# Patient Record
Sex: Female | Born: 1995 | Race: White | Hispanic: No | Smoking: Former smoker
Health system: Southern US, Community
[De-identification: ages and names within clinical notes are randomized; demographics above are authoritative.]

## PROBLEM LIST (undated history)

## (undated) ENCOUNTER — Inpatient Hospital Stay: Payer: Self-pay

## (undated) DIAGNOSIS — K449 Diaphragmatic hernia without obstruction or gangrene: Secondary | ICD-10-CM

## (undated) DIAGNOSIS — B009 Herpesviral infection, unspecified: Secondary | ICD-10-CM

## (undated) DIAGNOSIS — K219 Gastro-esophageal reflux disease without esophagitis: Secondary | ICD-10-CM

## (undated) DIAGNOSIS — F32A Depression, unspecified: Secondary | ICD-10-CM

## (undated) DIAGNOSIS — F329 Major depressive disorder, single episode, unspecified: Secondary | ICD-10-CM

## (undated) DIAGNOSIS — F419 Anxiety disorder, unspecified: Secondary | ICD-10-CM

## (undated) HISTORY — DX: Diaphragmatic hernia without obstruction or gangrene: K44.9

## (undated) HISTORY — DX: Depression, unspecified: F32.A

## (undated) HISTORY — PX: INDUCED ABORTION: SHX677

## (undated) HISTORY — DX: Anxiety disorder, unspecified: F41.9

## (undated) HISTORY — DX: Gastro-esophageal reflux disease without esophagitis: K21.9

## (undated) HISTORY — DX: Major depressive disorder, single episode, unspecified: F32.9

## (undated) HISTORY — DX: Herpesviral infection, unspecified: B00.9

## (undated) HISTORY — PX: WISDOM TOOTH EXTRACTION: SHX21

---
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
## 2014-12-24 ENCOUNTER — Encounter: Payer: Self-pay | Admitting: Emergency Medicine

## 2014-12-24 ENCOUNTER — Emergency Department: Payer: Medicaid Other

## 2014-12-24 ENCOUNTER — Emergency Department
Admission: EM | Admit: 2014-12-24 | Discharge: 2014-12-24 | Disposition: A | Discharge status: Home / Self Care | Payer: Medicaid Other | Attending: Emergency Medicine | Admitting: Emergency Medicine

## 2014-12-24 DIAGNOSIS — R1030 Lower abdominal pain, unspecified: Secondary | ICD-10-CM | POA: Insufficient documentation

## 2014-12-24 DIAGNOSIS — O9989 Other specified diseases and conditions complicating pregnancy, childbirth and the puerperium: Secondary | ICD-10-CM | POA: Insufficient documentation

## 2014-12-24 DIAGNOSIS — H6693 Otitis media, unspecified, bilateral: Secondary | ICD-10-CM | POA: Diagnosis not present

## 2014-12-24 DIAGNOSIS — Z87891 Personal history of nicotine dependence: Secondary | ICD-10-CM | POA: Insufficient documentation

## 2014-12-24 DIAGNOSIS — Z3A01 Less than 8 weeks gestation of pregnancy: Secondary | ICD-10-CM | POA: Diagnosis not present

## 2014-12-24 DIAGNOSIS — O210 Mild hyperemesis gravidarum: Secondary | ICD-10-CM | POA: Insufficient documentation

## 2014-12-24 DIAGNOSIS — Z880 Allergy status to penicillin: Secondary | ICD-10-CM | POA: Diagnosis not present

## 2014-12-24 DIAGNOSIS — Z3491 Encounter for supervision of normal pregnancy, unspecified, first trimester: Secondary | ICD-10-CM

## 2014-12-24 DIAGNOSIS — O99351 Diseases of the nervous system complicating pregnancy, first trimester: Secondary | ICD-10-CM | POA: Insufficient documentation

## 2014-12-24 LAB — COMPREHENSIVE METABOLIC PANEL
ALBUMIN: 4.7 g/dL (ref 3.5–5.0)
ALT: 13 U/L — AB (ref 14–54)
AST: 18 U/L (ref 15–41)
Alkaline Phosphatase: 56 U/L (ref 38–126)
Anion gap: 7 (ref 5–15)
BUN: 7 mg/dL (ref 6–20)
CHLORIDE: 106 mmol/L (ref 101–111)
CO2: 24 mmol/L (ref 22–32)
Calcium: 9.5 mg/dL (ref 8.9–10.3)
Creatinine, Ser: 0.6 mg/dL (ref 0.44–1.00)
GFR calc Af Amer: >60 mL/min (ref >60)
GFR calc non Af Amer: >60 mL/min (ref >60)
GLUCOSE: 107 mg/dL — AB (ref 65–99)
POTASSIUM: 3.3 mmol/L — AB (ref 3.5–5.1)
SODIUM: 137 mmol/L (ref 135–145)
Total Bilirubin: 0.9 mg/dL (ref 0.3–1.2)
Total Protein: 8.2 g/dL — ABNORMAL HIGH (ref 6.5–8.1)

## 2014-12-24 LAB — URINALYSIS COMPLETE WITH MICROSCOPIC (ARMC ONLY)
Bilirubin Urine: NEGATIVE
GLUCOSE, UA: NEGATIVE mg/dL
HGB URINE DIPSTICK: NEGATIVE
Ketones, ur: NEGATIVE mg/dL
LEUKOCYTES UA: NEGATIVE
Nitrite: NEGATIVE
Protein, ur: NEGATIVE mg/dL
Specific Gravity, Urine: 1.021 (ref 1.005–1.030)
pH: 6 (ref 5.0–8.0)

## 2014-12-24 LAB — CBC
HEMATOCRIT: 41.7 % (ref 35.0–47.0)
Hemoglobin: 14.3 g/dL (ref 12.0–16.0)
MCH: 32.2 pg (ref 26.0–34.0)
MCHC: 34.2 g/dL (ref 32.0–36.0)
MCV: 94.2 fL (ref 80.0–100.0)
Platelets: 343 10*3/uL (ref 150–440)
RBC: 4.42 MIL/uL (ref 3.80–5.20)
RDW: 12.5 % (ref 11.5–14.5)
WBC: 13.7 10*3/uL — AB (ref 3.6–11.0)

## 2014-12-24 LAB — LIPASE, BLOOD: LIPASE: 35 U/L (ref 11–51)

## 2014-12-24 LAB — HCG, QUANTITATIVE, PREGNANCY: HCG, BETA CHAIN, QUANT, S: 18523 m[IU]/mL — AB (ref <5)

## 2014-12-24 MED ORDER — PROMETHAZINE HCL 25 MG PO TABS: 25.0000 mg | ORAL_TABLET | Freq: Four times a day (QID) | ORAL | Status: DC | PRN

## 2014-12-24 MED ORDER — AMOXICILLIN 500 MG PO CAPS: 500.0000 mg | ORAL_CAPSULE | Freq: Two times a day (BID) | ORAL | Status: DC

## 2014-12-24 MED ORDER — PRENATAL VITAMIN PLUS LOW IRON 27-1 MG PO TABS: 1.0000 | ORAL_TABLET | Freq: Once | ORAL | Status: DC

## 2014-12-24 NOTE — ED Notes (Signed)
Pt would like to be seen for pregnancy.  She is [redacted] weeks pregnant and reports vomiting today.  Is concerned bc has not vomited before with this pregnancy.  G2P0A1.  Also wants to be seen for bilateral ear pain.  Normally abdominal pain is lower but c/o upper abdominal pain today.

## 2014-12-24 NOTE — ED Provider Notes (Signed)
[facility] Emergency Department Provider Note  ____________________________________________  Time seen: 2:30  I have reviewed the triage vital signs and the nursing notes.   HISTORY  Chief Complaint Abdominal Pain; Otalgia; and Emesis    HPI Michele Bailey is a 19 y.o. female who is approximately [redacted] weeks pregnant. She reports she follows with the health department. She complains of bilateral ear discomfort for several days. In addition she is having nausea and vomiting the last 2 days which she has not had earlier in the pregnancy. Finally she notes that she has had intermittent lower abdominal discomfort. She denies dysuria. No vaginal bleeding. No fevers no chills. No vaginal discharge.     History reviewed. No pertinent past medical history.  There are no active problems to display for this patient.   History reviewed. No pertinent past surgical history.  Current Outpatient Rx  Name  Route  Sig  Dispense  Refill  . amoxicillin (AMOXIL) 500 MG capsule   Oral   Take 1 capsule (500 mg total) by mouth 2 (two) times daily.   14 capsule   0     Allergies Amoxicillin  History reviewed. No pertinent family history.  Social History Social History  Substance Use Topics  . Smoking status: Former Research scientist (life sciences)  . Smokeless tobacco: None  . Alcohol Use: No    Review of Systems  Constitutional: Negative for fever. Eyes: Negative for visual changes. ENT: Negative for sore throat. Positive for bilateral ear pain Cardiovascular: Negative for chest pain. Respiratory: Negative for shortness of breath. No cough Gastrointestinal: Negative for diarrhea. Genitourinary: Negative for dysuria. Negative for vaginal discharge Musculoskeletal: Negative for back pain. Skin: Negative for rash. Neurological: Negative for headaches  Psychiatric: No anxiety    ____________________________________________   PHYSICAL EXAM:  VITAL SIGNS: ED Triage Vitals  Enc  Vitals Group     BP 12/24/14 1316 144/81 mmHg     Pulse Rate 12/24/14 1316 95     Resp 12/24/14 1316 16     Temp 12/24/14 1316 98.4 F (36.9 C)     Temp Source 12/24/14 1316 Oral     SpO2 12/24/14 1316 98 %     Weight 12/24/14 1316 150 lb (68.04 kg)     Height 12/24/14 1316 5\' 5"  (1.651 m)     Head Cir --      Peak Flow --      Pain Score 12/24/14 1316 8     Pain Loc --      Pain Edu? --      Excl. in [place]? --      Constitutional: Alert and oriented. Well appearing and in no distress. Eyes: Conjunctivae are normal.  ENT   Head: Normocephalic and atraumatic.   Mouth/Throat: Mucous membranes are moist. Ears: Patient with significant erythema and fluid behind bilateral TMs Cardiovascular: Normal rate, regular rhythm. Normal and symmetric distal pulses are present in all extremities. No murmurs, rubs, or gallops. Respiratory: Normal respiratory effort without tachypnea nor retractions.  Gastrointestinal: Soft and non-tender in all quadrants. No distention. There is no CVA tenderness. Genitourinary: deferred Musculoskeletal: Nontender with normal range of motion in all extremities. No lower extremity tenderness nor edema. Neurologic:  Normal speech and language. No gross focal neurologic deficits are appreciated. Skin:  Skin is warm, dry and intact. No rash noted. Psychiatric: Mood and affect are normal. Patient exhibits appropriate insight and judgment.  ____________________________________________    LABS (pertinent positives/negatives)  Labs Reviewed  COMPREHENSIVE METABOLIC PANEL -  Abnormal; Notable for the following:    Potassium 3.3 (*)    Glucose, Bld 107 (*)    Total Protein 8.2 (*)    ALT 13 (*)    All other components within normal limits  CBC - Abnormal; Notable for the following:    WBC 13.7 (*)    All other components within normal limits  LIPASE, BLOOD  URINALYSIS COMPLETEWITH MICROSCOPIC ([facility] ONLY)  HCG, QUANTITATIVE, PREGNANCY  POC URINE PREG, ED     ____________________________________________   EKG  None  ____________________________________________    RADIOLOGY I have personally reviewed any xrays that were ordered on this patient: Ultrasound pending  ____________________________________________   PROCEDURES  Procedure(s) performed: none  Critical Care performed: none  ____________________________________________   INITIAL IMPRESSION / ASSESSMENT AND PLAN / ED COURSE  Pertinent labs & imaging results that were available during my care of the patient were reviewed by me and considered in my medical decision making (see chart for details).  Patient presents with several complaints. 1. Bilateral ear pain surprisingly appears consistent with bilateral otitis media, I will prescribe amoxicillin  2. Lower abdominal discomfort intermittently over the last 2 weeks is likely normal discomfort of pregnancy however she is not had an ultrasound so we will send a beta CG and obtain pelvic ultrasound  3. Nausea: Likely related to pregnancy   Labs are overall unremarkable, she has a mildly elevated white blood cell count which is likely secondary to pregnancy and is also in line with prior lab results, no evidence for bacterial infection  I have asked Dr. Jimmye Norman to follow up on ultrasound, anticipate discharge if normal ____________________________________________   FINAL CLINICAL IMPRESSION(S) / ED DIAGNOSES  Final diagnoses:  Bilateral acute otitis media, recurrence not specified, unspecified otitis media type   abdominal pain of pregnancy   Michele Drafts, MD 12/24/14 1505

## 2014-12-24 NOTE — ED Notes (Signed)
Discussed discharge instructions, prescriptions, and follow-up care with patient. No questions or concerns at this time. Pt stable at discharge.  

## 2014-12-24 NOTE — ED Provider Notes (Signed)
IMPRESSION: Single live intrauterine gestation of 5 weeks 6 days.  Patient be discharged with amoxicillin to treat otitis media, she is encouraged to use prenatal vitamins and I'll prescribe antiemetics if she needs them.  Earleen Newport, MD 12/24/14 [phone]

## 2014-12-24 NOTE — Discharge Instructions (Signed)
First Trimester of Pregnancy  The first trimester of pregnancy is from week 1 until the end of week 12 (months 1 through 3). A week after a sperm fertilizes an egg, the egg will implant on the wall of the uterus. This embryo will begin to develop into a baby. Genes from you and your partner are forming the baby. The female genes determine whether the baby is a boy or a girl. At 6-8 weeks, the eyes and face are formed, and the heartbeat can be seen on ultrasound. At the end of 12 weeks, all the baby's organs are formed.   Now that you are pregnant, you will want to do everything you can to have a healthy baby. Two of the most important things are to get good prenatal care and to follow your health care provider's instructions. Prenatal care is all the medical care you receive before the baby's birth. This care will help prevent, find, and treat any problems during the pregnancy and childbirth.  BODY CHANGES  Your body goes through many changes during pregnancy. The changes vary from woman to woman.   · You may gain or lose a couple of pounds at first.  · You may feel sick to your stomach (nauseous) and throw up (vomit). If the vomiting is uncontrollable, call your health care provider.  · You may tire easily.  · You may develop headaches that can be relieved by medicines approved by your health care provider.  · You may urinate more often. Painful urination may mean you have a bladder infection.  · You may develop heartburn as a result of your pregnancy.  · You may develop constipation because certain hormones are causing the muscles that push waste through your intestines to slow down.  · You may develop hemorrhoids or swollen, bulging veins (varicose veins).  · Your breasts may begin to grow larger and become tender. Your nipples may stick out more, and the tissue that surrounds them (areola) may become darker.  · Your gums may bleed and may be sensitive to brushing and flossing.   · Dark spots or blotches (chloasma, mask of pregnancy) may develop on your face. This will likely fade after the baby is born.  · Your menstrual periods will stop.  · You may have a loss of appetite.  · You may develop cravings for certain kinds of food.  · You may have changes in your emotions from day to day, such as being excited to be pregnant or being concerned that something may go wrong with the pregnancy and baby.  · You may have more vivid and strange dreams.  · You may have changes in your hair. These can include thickening of your hair, rapid growth, and changes in texture. Some women also have hair loss during or after pregnancy, or hair that feels dry or thin. Your hair will most likely return to normal after your baby is born.  WHAT TO EXPECT AT YOUR PRENATAL VISITS  During a routine prenatal visit:  · You will be weighed to make sure you and the baby are growing normally.  · Your blood pressure will be taken.  · Your abdomen will be measured to track your baby's growth.  · The fetal heartbeat will be listened to starting around week 10 or 12 of your pregnancy.  · Test results from any previous visits will be discussed.  Your health care provider may ask you:  · How you are feeling.  · If you   including cigarettes, chewing tobacco, and electronic cigarettes. °· If you have any questions. °Other tests that may be performed during your first trimester include: °· Blood tests to find your blood type and to check for the presence of any previous infections. They will also be used to check for low iron levels (anemia) and Rh antibodies. Later in the pregnancy, blood tests for diabetes will be done along with other tests if problems develop. °· Urine tests to check for infections, diabetes, or protein in the urine. °· An ultrasound to confirm the proper growth  and development of the baby. °· An amniocentesis to check for possible genetic problems. °· Fetal screens for spina bifida and Down syndrome. °· You may need other tests to make sure you and the baby are doing well. °· HIV (human immunodeficiency virus) testing. Routine prenatal testing includes screening for HIV, unless you choose not to have this test. °HOME CARE INSTRUCTIONS  °Medicines °· Follow your health care provider's instructions regarding medicine use. Specific medicines may be either safe or unsafe to take during pregnancy. °· Take your prenatal vitamins as directed. °· If you develop constipation, try taking a stool softener if your health care provider approves. °Diet °· Eat regular, well-balanced meals. Choose a variety of foods, such as meat or vegetable-based protein, fish, milk and low-fat dairy products, vegetables, fruits, and whole grain breads and cereals. Your health care provider will help you determine the amount of weight gain that is right for you. °· Avoid raw meat and uncooked cheese. These carry germs that can cause birth defects in the baby. °· Eating four or five small meals rather than three large meals a day may help relieve nausea and vomiting. If you start to feel nauseous, eating a few soda crackers can be helpful. Drinking liquids between meals instead of during meals also seems to help nausea and vomiting. °· If you develop constipation, eat more high-fiber foods, such as fresh vegetables or fruit and whole grains. Drink enough fluids to keep your urine clear or pale yellow. °Activity and Exercise °· Exercise only as directed by your health care provider. Exercising will help you: °¨ Control your weight. °¨ Stay in shape. °¨ Be prepared for labor and delivery. °· Experiencing pain or cramping in the lower abdomen or low back is a good sign that you should stop exercising. Check with your health care provider before continuing normal exercises. °· Try to avoid standing for long  periods of time. Move your legs often if you must stand in one place for a long time. °· Avoid heavy lifting. °· Wear low-heeled shoes, and practice good posture. °· You may continue to have sex unless your health care provider directs you otherwise. °Relief of Pain or Discomfort °· Wear a good support bra for breast tenderness.   °· Take warm sitz baths to soothe any pain or discomfort caused by hemorrhoids. Use hemorrhoid cream if your health care provider approves.   °· Rest with your legs elevated if you have leg cramps or low back pain. °· If you develop varicose veins in your legs, wear support hose. Elevate your feet for 15 minutes, 3-4 times a day. Limit salt in your diet. °Prenatal Care °· Schedule your prenatal visits by the twelfth week of pregnancy. They are usually scheduled monthly at first, then more often in the last 2 months before delivery. °· Write down your questions. Take them to your prenatal visits. °· Keep all your prenatal visits as directed by your   health care provider. Safety  Wear your seat belt at all times when driving.  Make a list of emergency phone numbers, including numbers for family, friends, the hospital, and police and fire departments. General Tips  Ask your health care provider for a referral to a local prenatal education class. Begin classes no later than at the beginning of month 6 of your pregnancy.  Ask for help if you have counseling or nutritional needs during pregnancy. Your health care provider can offer advice or refer you to specialists for help with various needs.  Do not use hot tubs, steam rooms, or saunas.  Do not douche or use tampons or scented sanitary pads.  Do not cross your legs for long periods of time.  Avoid cat litter boxes and soil used by cats. These carry germs that can cause birth defects in the baby and possibly loss of the fetus by miscarriage or stillbirth.  Avoid all smoking, herbs, alcohol, and medicines not prescribed by  your health care provider. Chemicals in these affect the formation and growth of the baby.  Do not use any tobacco products, including cigarettes, chewing tobacco, and electronic cigarettes. If you need help quitting, ask your health care provider. You may receive counseling support and other resources to help you quit.  Schedule a dentist appointment. At home, brush your teeth with a soft toothbrush and be gentle when you floss. SEEK MEDICAL CARE IF:   You have dizziness.  You have mild pelvic cramps, pelvic pressure, or nagging pain in the abdominal area.  You have persistent nausea, vomiting, or diarrhea.  You have a bad smelling vaginal discharge.  You have pain with urination.  You notice increased swelling in your face, hands, legs, or ankles. SEEK IMMEDIATE MEDICAL CARE IF:   You have a fever.  You are leaking fluid from your vagina.  You have spotting or bleeding from your vagina.  You have severe abdominal cramping or pain.  You have rapid weight gain or loss.  You vomit blood or material that looks like coffee grounds.  You are exposed to [place] measles and have never had them.  You are exposed to fifth disease or chickenpox.  You develop a severe headache.  You have shortness of breath.  You have any kind of trauma, such as from a fall or a car accident.   This information is not intended to replace advice given to you by your health care provider. Make sure you discuss any questions you have with your health care provider.   Document Released: 12/25/2000 Document Revised: 01/21/2014 Document Reviewed: 11/10/2012 Elsevier Interactive Patient Education 2016 Else[employer]  Otitis Media, Adult Otitis media is redness, soreness, and puffiness (swelling) in the space just behind your eardrum (middle ear). It may be caused by allergies or infection. It often happens along with a cold. HOME CARE  Take your medicine as told. Finish it even if you start to feel  better.  Only take over-the-counter or prescription medicines for pain, discomfort, or fever as told by your doctor.  Follow up with your doctor as told. GET HELP IF:  You have otitis media only in one ear, or bleeding from your nose, or both.  You notice a lump on your neck.  You are not getting better in 3-5 days.  You feel worse instead of better. GET HELP RIGHT AWAY IF:   You have pain that is not helped with medicine.  You have puffiness, redness, or pain around your ear.  You get a stiff neck.  You cannot move part of your face (paralysis).  You notice that the bone behind your ear hurts when you touch it. MAKE SURE YOU:   Understand these instructions.  Will watch your condition.  Will get help right away if you are not doing well or get worse.   This information is not intended to replace advice given to you by your health care provider. Make sure you discuss any questions you have with your health care provider.   Document Released: 06/19/2007 Document Revised: 01/21/2014 Document Reviewed: 07/28/2012 Elsevier Interactive Patient Education [employer].

## 2015-01-05 ENCOUNTER — Ambulatory Visit (INDEPENDENT_AMBULATORY_CARE_PROVIDER_SITE_OTHER): Payer: Medicaid Other | Admitting: Obstetrics and Gynecology

## 2015-01-05 VITALS — BP 110/65 | HR 71 | Wt 153.1 lb

## 2015-01-05 DIAGNOSIS — Z113 Encounter for screening for infections with a predominantly sexual mode of transmission: Secondary | ICD-10-CM

## 2015-01-05 DIAGNOSIS — Z369 Encounter for antenatal screening, unspecified: Secondary | ICD-10-CM

## 2015-01-05 DIAGNOSIS — Z331 Pregnant state, incidental: Secondary | ICD-10-CM

## 2015-01-05 DIAGNOSIS — Z3490 Encounter for supervision of normal pregnancy, unspecified, unspecified trimester: Secondary | ICD-10-CM

## 2015-01-05 DIAGNOSIS — Z36 Encounter for antenatal screening of mother: Secondary | ICD-10-CM

## 2015-01-05 DIAGNOSIS — Z3687 Encounter for antenatal screening for uncertain dates: Secondary | ICD-10-CM

## 2015-01-05 DIAGNOSIS — Z1389 Encounter for screening for other disorder: Secondary | ICD-10-CM

## 2015-01-05 LAB — OB RESULTS CONSOLE VARICELLA ZOSTER ANTIBODY, IGG: Varicella: IMMUNE

## 2015-01-05 NOTE — Patient Instructions (Signed)

## 2015-01-05 NOTE — Progress Notes (Signed)
Michele Bailey presents for NOB nurse interview visit. G-1. P-0. Had pregnancy confirmation at [facility] on 12/12/14. Pt states that on exam at the [facility] her uterus felt 10wks. LMP: approx. 11/11/2014. EDD: 08/18/2015. Pt states she had Nexplanon removed 08/2014 and was broken in half prior to removal. Pregnancy education material explained and given. No cats in the home. NOB labs ordered.  HIV labs and Drug screen were explained optional and she could opt out of tests but did not decline. Drug screen ordered. PNV encouraged. NT to discuss with provider. Pt. To follow up with provider after viability and dating scan for NOB physical. The [place] from ER, states pt was 5wk 6days on 12/24/14, Pt was seen there for ear infection and given ATB that she has completed.   All questions answered.  ZIKA EXPOSURE SCREEN:  The patient has not traveled to a [place] Virus endemic area within the past 6 months, nor has she had unprotected sex with a partner who has travelled to a [place] endemic region within the past 6 months. The patient has been advised to notify us if these factors change any time during this current pregnancy, so adequate testing and monitoring can be initiated.

## 2015-01-07 LAB — PAIN MGT SCRN (14 DRUGS), UR
AMPHETAMINE SCRN UR: NEGATIVE ng/mL
BARBITURATE SCRN UR: NEGATIVE ng/mL
BUPRENORPHINE, URINE: NEGATIVE ng/mL
Benzodiazepine Screen, Urine: NEGATIVE ng/mL
COCAINE(METAB.) SCREEN, URINE: NEGATIVE ng/mL
Cannabinoids Ur Ql Scn: POSITIVE ng/mL
Creatinine(Crt), U: 76.9 mg/dL (ref 20.0–300.0)
FENTANYL, URINE: NEGATIVE pg/mL
METHADONE SCREEN, URINE: NEGATIVE ng/mL
Meperidine Screen, Urine: NEGATIVE ng/mL
Opiate Scrn, Ur: NEGATIVE ng/mL
Oxycodone+Oxymorphone Ur Ql Scn: NEGATIVE ng/mL
PCP SCRN UR: NEGATIVE ng/mL
PH UR, DRUG SCRN: 7.4 (ref 4.5–8.9)
Propoxyphene, Screen: NEGATIVE ng/mL
TRAMADOL UR QL SCN: NEGATIVE ng/mL

## 2015-01-07 LAB — CBC WITH DIFFERENTIAL/PLATELET
BASOS ABS: 0 10*3/uL (ref 0.0–0.2)
Basos: 0 %
EOS (ABSOLUTE): 0.1 10*3/uL (ref 0.0–0.4)
Eos: 1 %
HEMOGLOBIN: 12.7 g/dL (ref 11.1–15.9)
Hematocrit: 37.6 % (ref 34.0–46.6)
Immature Grans (Abs): 0 10*3/uL (ref 0.0–0.1)
Immature Granulocytes: 0 %
LYMPHS ABS: 2.5 10*3/uL (ref 0.7–3.1)
Lymphs: 22 %
MCH: 31.8 pg (ref 26.6–33.0)
MCHC: 33.8 g/dL (ref 31.5–35.7)
MCV: 94 fL (ref 79–97)
MONOCYTES: 6 %
MONOS ABS: 0.7 10*3/uL (ref 0.1–0.9)
NEUTROS ABS: 8 10*3/uL — AB (ref 1.4–7.0)
Neutrophils: 71 %
PLATELETS: 407 10*3/uL — AB (ref 150–379)
RBC: 3.99 x10E6/uL (ref 3.77–5.28)
RDW: 13.1 % (ref 12.3–15.4)
WBC: 11.3 10*3/uL — AB (ref 3.4–10.8)

## 2015-01-07 LAB — HEPATITIS B SURFACE ANTIGEN: HEP B S AG: NEGATIVE

## 2015-01-07 LAB — URINALYSIS, ROUTINE W REFLEX MICROSCOPIC
BILIRUBIN UA: NEGATIVE
GLUCOSE, UA: NEGATIVE
KETONES UA: NEGATIVE
LEUKOCYTES UA: NEGATIVE
Nitrite, UA: NEGATIVE
PH UA: 7.5 (ref 5.0–7.5)
PROTEIN UA: NEGATIVE
RBC UA: NEGATIVE
SPEC GRAV UA: 1.015 (ref 1.005–1.030)
UUROB: 0.2 mg/dL (ref 0.2–1.0)

## 2015-01-07 LAB — ANTIBODY SCREEN: ANTIBODY SCREEN: NEGATIVE

## 2015-01-07 LAB — NICOTINE SCREEN, URINE: Cotinine Ql Scrn, Ur: NEGATIVE ng/mL

## 2015-01-07 LAB — RUBELLA ANTIBODY, IGM: Rubella IgM: <20 AU/mL (ref 0.0–19.9)

## 2015-01-07 LAB — RH TYPE: RH TYPE: POSITIVE

## 2015-01-07 LAB — ABO

## 2015-01-07 LAB — HIV ANTIBODY (ROUTINE TESTING W REFLEX): HIV SCREEN 4TH GENERATION: NONREACTIVE

## 2015-01-07 LAB — RPR: RPR: NONREACTIVE

## 2015-01-08 LAB — GC/CHLAMYDIA PROBE AMP
CHLAMYDIA, DNA PROBE: NEGATIVE
Neisseria gonorrhoeae by PCR: NEGATIVE

## 2015-01-08 LAB — CULTURE, OB URINE

## 2015-01-08 LAB — URINE CULTURE, OB REFLEX

## 2015-01-10 LAB — VARICELLA ZOSTER ANTIBODY, IGM: Varicella IgM: <0.91 index (ref 0.00–0.90)

## 2015-01-10 LAB — SICKLE CELL SCREEN: SICKLE CELL SCREEN: NEGATIVE

## 2015-01-12 ENCOUNTER — Ambulatory Visit (INDEPENDENT_AMBULATORY_CARE_PROVIDER_SITE_OTHER): Payer: Medicaid Other

## 2015-01-12 DIAGNOSIS — Z331 Pregnant state, incidental: Secondary | ICD-10-CM

## 2015-01-12 DIAGNOSIS — Z369 Encounter for antenatal screening, unspecified: Secondary | ICD-10-CM

## 2015-01-12 DIAGNOSIS — Z3490 Encounter for supervision of normal pregnancy, unspecified, unspecified trimester: Secondary | ICD-10-CM

## 2015-01-12 DIAGNOSIS — Z36 Encounter for antenatal screening of mother: Secondary | ICD-10-CM

## 2015-01-12 DIAGNOSIS — Z3687 Encounter for antenatal screening for uncertain dates: Secondary | ICD-10-CM

## 2015-01-15 NOTE — L&D Delivery Note (Signed)
Delivery Summary for Michele Bailey  Labor Events:   Preterm labor:   Rupture date:   Rupture time:   Rupture type:   Fluid Color: Clear  Induction:   Augmentation:   Complications:   Cervical ripening:          Delivery:   Episiotomy:   Lacerations:   Repair suture:   Repair # of packets:   Blood loss (ml): 350   Information for the patient's newborn:  Ceonna, Meckel [id number]    Delivery 08/25/2015 1:49 AM by  Vaginal, Spontaneous Delivery Sex:  female Gestational Age: [redacted]w[redacted]d Delivery Clinician:   Living?:         APGARS  One minute Five minutes Ten minutes  Skin color:        Heart rate:        Grimace:        Muscle tone:        Breathing:        Totals: 1  9  9     Presentation/position:      Resuscitation:   Cord information:    Disposition of cord blood:     Blood gases sent?  Complications:   Placenta: Delivered:       appearance Newborn Measurements: Weight: 7 lb 4.1 oz (3290 g)  Height: 7.97"  Head circumference:    Chest circumference:    Other providers:    Additional  information: Forceps:   Vacuum:   Breech:   Observed anomalies       Delivery Note At 1:49 AM a viable female was delivered via Vaginal, Spontaneous Delivery (Presentation: vertex; LOA position).  APGAR: 1,9; weight 7 lb 4.1 oz (3290 g).   Placenta status: manually removed secondary to avulsion of cord slightly above level of vaginal hymen, intact.  Cord: 3-vessel with the following complications: tight nuchal, non-reducible, clamped x 2 and cut, thin cord.  Cord pH: not obtained  Anesthesia:  Epidural Episiotomy: None Lacerations: 1st degree;Perineal Suture Repair: None Est. Blood Loss (mL): 350  Mom to postpartum.  Baby to NICU for observation.  [name] 08/25/2015, 2:23 AM

## 2015-02-02 ENCOUNTER — Encounter: Payer: Self-pay | Admitting: Obstetrics and Gynecology

## 2015-02-02 ENCOUNTER — Ambulatory Visit (INDEPENDENT_AMBULATORY_CARE_PROVIDER_SITE_OTHER): Payer: Medicaid Other | Admitting: Obstetrics and Gynecology

## 2015-02-02 VITALS — BP 116/76 | HR 73 | Wt 151.7 lb

## 2015-02-02 DIAGNOSIS — B009 Herpesviral infection, unspecified: Secondary | ICD-10-CM

## 2015-02-02 DIAGNOSIS — O219 Vomiting of pregnancy, unspecified: Secondary | ICD-10-CM

## 2015-02-02 DIAGNOSIS — D473 Essential (hemorrhagic) thrombocythemia: Secondary | ICD-10-CM

## 2015-02-02 DIAGNOSIS — Z3401 Encounter for supervision of normal first pregnancy, first trimester: Secondary | ICD-10-CM

## 2015-02-02 DIAGNOSIS — D75839 Thrombocytosis, unspecified: Secondary | ICD-10-CM

## 2015-02-04 ENCOUNTER — Encounter: Payer: Self-pay | Admitting: Obstetrics and Gynecology

## 2015-02-04 DIAGNOSIS — K219 Gastro-esophageal reflux disease without esophagitis: Secondary | ICD-10-CM

## 2015-02-04 DIAGNOSIS — D75839 Thrombocytosis, unspecified: Secondary | ICD-10-CM | POA: Insufficient documentation

## 2015-02-04 DIAGNOSIS — B009 Herpesviral infection, unspecified: Secondary | ICD-10-CM

## 2015-02-04 DIAGNOSIS — D473 Essential (hemorrhagic) thrombocythemia: Secondary | ICD-10-CM | POA: Insufficient documentation

## 2015-02-04 HISTORY — DX: Herpesviral infection, unspecified: B00.9

## 2015-02-04 HISTORY — DX: Gastro-esophageal reflux disease without esophagitis: K21.9

## 2015-02-04 NOTE — Progress Notes (Signed)
Marland Kitchen   OBSTETRIC INITIAL PRENATAL VISIT  Subjective:    Michele Bailey is being seen today for her first obstetrical visit.  This is a planned pregnancy. She is at [redacted]w[redacted]d gestation by last menstrual period was 11/11/2014 (exact date). Estimated Date of Delivery: 08/18/15. 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 Relationship with FOB: significant other, living together. Patient does intend to breast feed. Pregnancy history fully reviewed.  Menstrual History: OB History    Gravida Para Term Preterm AB TAB SAB Ectopic Multiple Living   1               Obstetric Comments   MGF- had twin that passed away age 64yr. Fob family has hx of twins      Menarche age: 65  Patient's last menstrual period was 11/11/2014 (exact date).  Has h/o HSV II.  No recent outbreaks.   No pap history.     Past Medical History  Diagnosis Date  . 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 Acid reflux 02/04/2015    Past Surgical History  Procedure Laterality Date  . No past surgeries      Family History  Problem Relation Age of Onset  . Rheum arthritis Mother   . Rheum arthritis Maternal Grandfather   . Ovarian cancer Other     mother's great aunt  . Hyperlipidemia Father   . Hypertension Father   . Migraines Mother   . Thyroid disease Mother     hypothyroid  . Cancer - Other      fob-father died at age 56y from a type of ca that he was born with    Social History   Social History  . Marital Status: Single    Spouse Name: N/A  . Number of Children: N/A  . Years of Education: N/A   Occupational History  . unemployed    Social History Main Topics  . Smoking status: Former Research scientist (life sciences)  . Smokeless tobacco: Not on file  . Alcohol Use: No  . Drug Use: No  . Sexual Activity: Not on file   Other Topics Concern  . Not on file   Social History Narrative    Current Outpatient Prescriptions on File Prior to Visit  Medication Sig Dispense Refill  . Prenatal Vit-Fe  Fumarate-FA (PRENATAL VITAMIN PLUS LOW IRON) 27-1 MG TABS Take 1 tablet by mouth once. 30 tablet 7  . promethazine (PHENERGAN) 25 MG tablet Take 1 tablet (25 mg total) by mouth every 6 (six) hours as needed for nausea or vomiting. (Patient not taking: Reported on 01/05/2015) 20 tablet 0   No current facility-administered medications on file prior to visit.    Allergies  Allergen Reactions  . Augmentin [Amoxicillin-Pot Clavulanate] Nausea Only     Review of Systems General:Not Present- Fever, Weight Loss and Weight Gain. Skin:Not Present- Rash. HEENT:Not Present- Blurred Vision, Headache and Bleeding Gums. Respiratory:Not Present- Difficulty Breathing. Breast:Not Present- Breast Mass. Cardiovascular:Not Present- Chest Pain, Elevated Blood Pressure, Fainting / Blacking Out and Shortness of Breath. Gastrointestinal:Present - Nausea. Not Present- Abdominal Pain, Constipation, and Vomiting. Female Genitourinary:Not Present- Frequency, Painful Urination, Pelvic Pain, Vaginal Bleeding, Vaginal Discharge, Contractions, regular, Fetal Movements Decreased, Urinary Complaints and Vaginal Fluid. Musculoskeletal:Not Present- Back Pain and Leg Cramps. Neurological:Not Present- Dizziness. Psychiatric:Not Present- Depression.    Objective:    BP 116/76 mmHg  Pulse 73  Wt 151 lb 11.2 oz (68.811 kg)  LMP 11/11/2014 (Exact Date)    General Appearance:  Alert, cooperative, no distress, appears stated age  Head:    Normocephalic, without obvious abnormality, atraumatic  Eyes:    PERRL, conjunctiva/corneas clear, EOM's intact, both eyes  Ears:    Normal external ear canals, both ears  Nose:   Nares normal, septum midline, mucosa normal, no drainage or sinus tenderness  Throat:   Lips, mucosa, and tongue normal; teeth and gums normal  Neck:   Supple, symmetrical, trachea midline, no adenopathy; thyroid: no enlargement/tenderness/nodules; no carotid bruit or JVD  Back:     Symmetric, no  curvature, ROM normal, no CVA tenderness  Lungs:     Clear to auscultation bilaterally, respirations unlabored  Chest Wall:    No tenderness or deformity   Heart:    Regular rate and rhythm, S1 and S2 normal, no murmur, rub or gallop  Breast Exam:    No tenderness, masses, or nipple abnormality  Abdomen:     Soft, non-tender, bowel sounds active all four quadrants, no masses, no organomegaly.  FHT 158  bpm.  Genitalia:    Pelvic:external genitalia normal, vagina without lesions, discharge, or tenderness, rectovaginal septum  normal. Cervix normal in appearance, no cervical motion tenderness, no adnexal masses or tenderness.  Pregnancy positive findings: uterine enlargement: 12 wk size, nontender.   Rectal:    Normal external sphincter.  No hemorrhoids appreciated. Internal exam not done.   Extremities:   Extremities normal, atraumatic, no cyanosis or edema  Pulses:   2+ and symmetric all extremities  Skin:   Skin color, texture, turgor normal, no rashes or lesions  Lymph nodes:   Cervical, supraclavicular, and axillary nodes normal  Neurologic:   CNII-XII intact, normal strength, sensation and reflexes throughout    Assessment:    Pregnancy at 11 and 6/7 weeks   Nause of pregnancy H/o HSV II Thrombocytosis  Plan:    Initial labs reviewed. Rubella and Varicella non-immune.  Will need vaccinations postpartum.  Continue prenatal vitamins. Problem list reviewed and updated. New OB counseling: The patient has been given an overview regarding routine prenatal care. Recommendations regarding diet, weight gain, and exercise in pregnancy were given. Prenatal testing, optional genetic testing, and ultrasound use in pregnancy were reviewed.  Patient is considering 1st trimester screen.  Benefits of Breast Feeding were discussed. The patient is encouraged to breastfeed her baby post partum. Continue Phenergan prn for nausea.  Notes vomiting has resolved and nausea is improving.  H/o HSV II, no  recent outbreaks.  Will ned HSV prophylaxis at [redacted] weeks gestation.  Thrombocytosis on most recent labs (407).  Unsure if reactive vs some other cause.  Will f/u in 2nd trimester.  Follow up in 4 weeks.   [name], MD [facility]

## 2015-02-07 ENCOUNTER — Other Ambulatory Visit: Payer: Self-pay | Admitting: Obstetrics and Gynecology

## 2015-02-07 DIAGNOSIS — Z369 Encounter for antenatal screening, unspecified: Secondary | ICD-10-CM

## 2015-02-10 ENCOUNTER — Ambulatory Visit (INDEPENDENT_AMBULATORY_CARE_PROVIDER_SITE_OTHER): Payer: Medicaid Other

## 2015-02-10 ENCOUNTER — Other Ambulatory Visit: Payer: Self-pay | Admitting: Obstetrics and Gynecology

## 2015-02-10 DIAGNOSIS — Z369 Encounter for antenatal screening, unspecified: Secondary | ICD-10-CM

## 2015-02-10 DIAGNOSIS — Z36 Encounter for antenatal screening of mother: Secondary | ICD-10-CM | POA: Diagnosis not present

## 2015-02-13 ENCOUNTER — Telehealth: Payer: Self-pay | Admitting: Obstetrics and Gynecology

## 2015-02-13 NOTE — Telephone Encounter (Signed)
Pt states she has a "little" itching outside vagina. No vaginal discharge. To try Monistat 7 day, vaginally at bedtime and may also put some externally as needed to relieve itching. If no improvement after 3 to 4 days contact office.

## 2015-02-13 NOTE — Telephone Encounter (Signed)
Pt is [redacted]wk pregnant. She thinks she may has a yeast infection. She said it sis itchy on the outside of her vagina. Please call to advise.

## 2015-02-14 LAB — FIRST TRIMESTER SCREEN W/NT
CRL: 63.7 mm
DIA MoM: 1.28
DIA VALUE: 296.3 pg/mL
Gest Age-Collect: 12.7 weeks
HCG MOM: 1.22
HCG VALUE: 108.5 [IU]/mL
Maternal Age At EDD: 20.6 years
Nuchal Translucency MoM: 1.57
Nuchal Translucency: 1.9 mm
Number of Fetuses: 1
PAPP-A MOM: 1.24
PAPP-A VALUE: 1306.9 ng/mL
PDF: 0
Test Results:: NEGATIVE
WEIGHT: 151 [lb_av]

## 2015-02-16 ENCOUNTER — Telehealth: Payer: Self-pay | Admitting: Obstetrics and Gynecology

## 2015-02-16 NOTE — Telephone Encounter (Signed)
Michele Bailey was told to use monistat for a yeast infedtion. It is worse now and her discharge has changed from yellowish to a glob of brown. She wants to know if she can get something else. She is 13 1/[redacted] wks pregnant.

## 2015-02-16 NOTE — Telephone Encounter (Signed)
Pt needs to come in and be seen, may have to see De for problem visit if Cherry's schedule is full.

## 2015-02-20 NOTE — Telephone Encounter (Signed)
Pt calls and states that she is now having UTI symptoms. Advised pt again to come in for an appt. Pt scheduled for 02/21/2015 @8 :00am.

## 2015-02-21 ENCOUNTER — Ambulatory Visit (INDEPENDENT_AMBULATORY_CARE_PROVIDER_SITE_OTHER): Payer: Medicaid Other | Admitting: Obstetrics and Gynecology

## 2015-02-21 VITALS — BP 113/74 | HR 80 | Wt 154.3 lb

## 2015-02-21 DIAGNOSIS — B373 Candidiasis of vulva and vagina: Secondary | ICD-10-CM | POA: Diagnosis not present

## 2015-02-21 DIAGNOSIS — R399 Unspecified symptoms and signs involving the genitourinary system: Secondary | ICD-10-CM

## 2015-02-21 DIAGNOSIS — B3731 Acute candidiasis of vulva and vagina: Secondary | ICD-10-CM

## 2015-02-21 LAB — POCT URINALYSIS DIPSTICK
Bilirubin, UA: NEGATIVE
Glucose, UA: NEGATIVE
KETONES UA: NEGATIVE
Nitrite, UA: NEGATIVE
PH UA: 7
PROTEIN UA: NEGATIVE
SPEC GRAV UA: 1.02
UROBILINOGEN UA: NEGATIVE

## 2015-02-21 MED ORDER — CLOTRIMAZOLE 1 % VA CREA: 1.0000 | TOPICAL_CREAM | Freq: Every day | VAGINAL | Status: DC

## 2015-02-21 MED ORDER — NITROFURANTOIN MONOHYD MACRO 100 MG PO CAPS: 100.0000 mg | ORAL_CAPSULE | Freq: Two times a day (BID) | ORAL | Status: DC

## 2015-02-21 NOTE — Progress Notes (Signed)
Subjective:   Michele Bailey is a 20 y.o. G1P0 [redacted]w[redacted]d being seen today for her obstetrical visit.  Patient reports urinary hesitancy, dysuria.  Also notes that last week she had a yeast infection.  Attempted to use Monistat which after 4 days caused irritation and burning. Thinks the yeast infection gave her a UTI.  Has been drinking water and cranberry juice with minimum relief.  Denies cramping, vaginal bleeding.   The following portions of the patient's history were reviewed and updated as appropriate: allergies, current medications, past family history, past medical history, past social history, past surgical history and problem list.   Objective:  BP 113/74 mmHg  Pulse 80  Wt 154 lb 4.8 oz (69.99 kg)  LMP 11/11/2014 (Exact Date)  FHT: Fetal Heart Rate (bpm): 152  Uterine Size: Fundal Height: 15 cm  Fetal Movement:  Not detected  Presentation:  N/A    Abdomen:  soft, gravid, appropriate for gestational age,non-tender  Vaginal:  Discharge, white, consistency curdy, odor odorless, wet mount normal epithelial cells and hyphae  Cervix: Closed, no lesions, no CMT   Results for orders placed or performed in visit on 02/21/15  POCT urinalysis dipstick  Result Value Ref Range   Color, UA Yellow    Clarity, UA Clear    Glucose, UA Neg    Bilirubin, UA Neg    Ketones, UA Neg    Spec Grav, UA 1.020    Blood, UA NHT    pH, UA 7.0    Protein, UA Neg    Urobilinogen, UA negative    Nitrite, UA Neg    Leukocytes, UA moderate (2+) (A) Negative     Assessment and Plan:   Pregnancy:  G1P0 at [redacted]w[redacted]d  1. UTI symptoms - Urine culture - POCT urinalysis dipstick - Will treat as patient is symptomatic.  Prescribed Macrobid 100 mg BID.  Can also continue with cranberry juice/Azo prn. Advised on adequate water intake.   2.  Vaginal candidasis - Patient reports a previous medication that has worked well for her in the past that was prescribed by [facility] (as Monistat caused  vulvo-vaginal irritation).  Prescribed Clotrimazole cream.     Follow up in 1 week for routine OB care.  [name], MD

## 2015-02-23 LAB — URINE CULTURE

## 2015-03-02 ENCOUNTER — Encounter: Payer: Medicaid Other | Admitting: Obstetrics and Gynecology

## 2015-03-08 ENCOUNTER — Encounter: Payer: Self-pay | Admitting: Obstetrics and Gynecology

## 2015-03-08 ENCOUNTER — Ambulatory Visit (INDEPENDENT_AMBULATORY_CARE_PROVIDER_SITE_OTHER): Payer: Medicaid Other | Admitting: Obstetrics and Gynecology

## 2015-03-08 VITALS — BP 109/69 | HR 73 | Wt 155.0 lb

## 2015-03-08 DIAGNOSIS — Z3492 Encounter for supervision of normal pregnancy, unspecified, second trimester: Secondary | ICD-10-CM

## 2015-03-08 LAB — POCT URINALYSIS DIPSTICK
BILIRUBIN UA: NEGATIVE
Blood, UA: NEGATIVE
Glucose, UA: NEGATIVE
KETONES UA: NEGATIVE
LEUKOCYTES UA: NEGATIVE
NITRITE UA: NEGATIVE
Protein, UA: NEGATIVE
Spec Grav, UA: 1.02
Urobilinogen, UA: 0.2
pH, UA: 6

## 2015-03-08 NOTE — Progress Notes (Signed)
[facility] gave pt meds for y/i- sx better.

## 2015-03-08 NOTE — Progress Notes (Signed)
ROB: Patient doing well, denies complaints.  Patient reports that she went to [facility] for treatment for yeast infection.  Notes symptoms have improved.  For anatomy scan next visit.  For serum AFP next visit (notes that she received a bill for 1st trimester screen from [facility]).

## 2015-03-30 ENCOUNTER — Other Ambulatory Visit: Payer: Medicaid Other

## 2015-03-30 ENCOUNTER — Telehealth: Payer: Self-pay | Admitting: Obstetrics and Gynecology

## 2015-03-30 ENCOUNTER — Other Ambulatory Visit: Payer: Self-pay | Admitting: Obstetrics and Gynecology

## 2015-03-30 DIAGNOSIS — R399 Unspecified symptoms and signs involving the genitourinary system: Secondary | ICD-10-CM

## 2015-03-30 MED ORDER — NITROFURANTOIN MONOHYD MACRO 100 MG PO CAPS: 100.0000 mg | ORAL_CAPSULE | Freq: Two times a day (BID) | ORAL | Status: DC

## 2015-03-30 NOTE — Telephone Encounter (Signed)
DONE

## 2015-03-30 NOTE — Telephone Encounter (Signed)
Pt states that whenever she urinates she has a cramping feeling, whenever she urinates and has a history of UTI. Advised pt to drop off urine sample today. Will test and send for antibiotic.

## 2015-03-30 NOTE — Telephone Encounter (Signed)
Patient called stating she is cramping. She is [redacted] weeks pregnant. Please Advise.Thanks

## 2015-04-02 LAB — URINALYSIS, ROUTINE W REFLEX MICROSCOPIC
Bilirubin, UA: NEGATIVE
GLUCOSE, UA: NEGATIVE
KETONES UA: NEGATIVE
Leukocytes, UA: NEGATIVE
NITRITE UA: NEGATIVE
PROTEIN UA: NEGATIVE
RBC UA: NEGATIVE
Specific Gravity, UA: 1.018 (ref 1.005–1.030)
UUROB: 0.2 mg/dL (ref 0.2–1.0)
pH, UA: 7 (ref 5.0–7.5)

## 2015-04-02 LAB — URINE CULTURE

## 2015-04-05 ENCOUNTER — Encounter: Payer: Self-pay | Admitting: Obstetrics and Gynecology

## 2015-04-05 ENCOUNTER — Ambulatory Visit (INDEPENDENT_AMBULATORY_CARE_PROVIDER_SITE_OTHER): Payer: Medicaid Other

## 2015-04-05 ENCOUNTER — Ambulatory Visit (INDEPENDENT_AMBULATORY_CARE_PROVIDER_SITE_OTHER): Payer: Medicaid Other | Admitting: Obstetrics and Gynecology

## 2015-04-05 VITALS — BP 110/75 | HR 80 | Wt 168.9 lb

## 2015-04-05 DIAGNOSIS — O26892 Other specified pregnancy related conditions, second trimester: Secondary | ICD-10-CM

## 2015-04-05 DIAGNOSIS — N898 Other specified noninflammatory disorders of vagina: Secondary | ICD-10-CM

## 2015-04-05 DIAGNOSIS — Z3492 Encounter for supervision of normal pregnancy, unspecified, second trimester: Secondary | ICD-10-CM

## 2015-04-05 DIAGNOSIS — R102 Pelvic and perineal pain: Secondary | ICD-10-CM

## 2015-04-05 DIAGNOSIS — Z3402 Encounter for supervision of normal first pregnancy, second trimester: Secondary | ICD-10-CM

## 2015-04-05 DIAGNOSIS — N949 Unspecified condition associated with female genital organs and menstrual cycle: Secondary | ICD-10-CM

## 2015-04-05 DIAGNOSIS — Z3403 Encounter for supervision of normal first pregnancy, third trimester: Secondary | ICD-10-CM | POA: Insufficient documentation

## 2015-04-05 LAB — POCT URINALYSIS DIPSTICK
BILIRUBIN UA: NEGATIVE
Glucose, UA: NEGATIVE
KETONES UA: NEGATIVE
LEUKOCYTES UA: NEGATIVE
Nitrite, UA: NEGATIVE
PH UA: 6.5
Protein, UA: NEGATIVE
RBC UA: NEGATIVE
SPEC GRAV UA: 1.01
Urobilinogen, UA: NEGATIVE

## 2015-04-05 MED ORDER — DOCUSATE SODIUM 100 MG PO CAPS: 100.0000 mg | ORAL_CAPSULE | Freq: Two times a day (BID) | ORAL | Status: DC | PRN

## 2015-04-05 NOTE — Progress Notes (Signed)
ROB: Patient complains of increased pelvic pressure/discomfort with intercourse.  Concerned if she has a vaginal infection (yeast vs BV).  Also still questions if she has a UTI.  Review of labs notes recent urine culture negative. Nuswab collected. Anatomy scan performed today reviewed, incomplete.  Will f/u in 2-3 weeks.  Patient notes that she desires to deliver at [facility].  Recommended transferring OB care to maintain continuity.

## 2015-04-13 LAB — NUSWAB VAGINITIS PLUS (VG+)
CANDIDA ALBICANS, NAA: NEGATIVE
CANDIDA GLABRATA, NAA: NEGATIVE
CHLAMYDIA TRACHOMATIS, NAA: NEGATIVE
NEISSERIA GONORRHOEAE, NAA: NEGATIVE
Trich vag by NAA: NEGATIVE

## 2015-04-21 ENCOUNTER — Ambulatory Visit (INDEPENDENT_AMBULATORY_CARE_PROVIDER_SITE_OTHER): Payer: Medicaid Other

## 2015-04-21 DIAGNOSIS — Z3402 Encounter for supervision of normal first pregnancy, second trimester: Secondary | ICD-10-CM

## 2015-04-27 ENCOUNTER — Telehealth: Payer: Self-pay | Admitting: Obstetrics and Gynecology

## 2015-04-27 NOTE — Telephone Encounter (Signed)
Called pt informed her of negative urine cx as well as negative nuswab. Pt questions why she has burning with urination after intercourse. Advised pt that this could be for a lack of lubrication during intercourse. Recommended pt try a water based lubricant. Call back if no improvement.

## 2015-04-27 NOTE — Telephone Encounter (Signed)
Patient called requesting results from a urine culture. You can reach her on [phone].Thanks

## 2015-05-03 ENCOUNTER — Ambulatory Visit (INDEPENDENT_AMBULATORY_CARE_PROVIDER_SITE_OTHER): Payer: Medicaid Other | Admitting: Obstetrics and Gynecology

## 2015-05-03 VITALS — BP 129/82 | HR 96 | Wt 177.8 lb

## 2015-05-03 DIAGNOSIS — Z3402 Encounter for supervision of normal first pregnancy, second trimester: Secondary | ICD-10-CM

## 2015-05-03 DIAGNOSIS — Z131 Encounter for screening for diabetes mellitus: Secondary | ICD-10-CM

## 2015-05-03 LAB — POCT URINALYSIS DIPSTICK
BILIRUBIN UA: NEGATIVE
Blood, UA: NEGATIVE
Glucose, UA: NEGATIVE
KETONES UA: NEGATIVE
Leukocytes, UA: NEGATIVE
Nitrite, UA: NEGATIVE
PH UA: 7
PROTEIN UA: NEGATIVE
Urobilinogen, UA: NEGATIVE

## 2015-05-03 NOTE — Progress Notes (Signed)
ROB: Denies complaints.  Patient doing well.  S/p repeat anatomy scan for incomplete anatomy, now complete and normal anatomy noted.  RTC in 4 weeks.  For 28 week labs at that time.

## 2015-05-31 ENCOUNTER — Other Ambulatory Visit: Payer: Self-pay

## 2015-05-31 ENCOUNTER — Ambulatory Visit (INDEPENDENT_AMBULATORY_CARE_PROVIDER_SITE_OTHER): Payer: Medicaid Other | Admitting: Obstetrics and Gynecology

## 2015-05-31 ENCOUNTER — Encounter: Payer: Self-pay | Admitting: Obstetrics and Gynecology

## 2015-05-31 VITALS — BP 106/71 | HR 77 | Wt 183.1 lb

## 2015-05-31 DIAGNOSIS — D473 Essential (hemorrhagic) thrombocythemia: Secondary | ICD-10-CM

## 2015-05-31 DIAGNOSIS — Z3402 Encounter for supervision of normal first pregnancy, second trimester: Secondary | ICD-10-CM

## 2015-05-31 DIAGNOSIS — Z3493 Encounter for supervision of normal pregnancy, unspecified, third trimester: Secondary | ICD-10-CM | POA: Diagnosis not present

## 2015-05-31 DIAGNOSIS — Z131 Encounter for screening for diabetes mellitus: Secondary | ICD-10-CM

## 2015-05-31 DIAGNOSIS — D75839 Thrombocytosis, unspecified: Secondary | ICD-10-CM

## 2015-05-31 DIAGNOSIS — Z3403 Encounter for supervision of normal first pregnancy, third trimester: Secondary | ICD-10-CM | POA: Diagnosis not present

## 2015-05-31 DIAGNOSIS — Z23 Encounter for immunization: Secondary | ICD-10-CM

## 2015-05-31 LAB — POCT URINALYSIS DIPSTICK
Bilirubin, UA: NEGATIVE
Blood, UA: NEGATIVE
GLUCOSE UA: NEGATIVE
KETONES UA: NEGATIVE
Nitrite, UA: NEGATIVE
PROTEIN UA: NEGATIVE
SPEC GRAV UA: 1.01
Urobilinogen, UA: 0.2
pH, UA: 7.5

## 2015-05-31 MED ORDER — TETANUS-DIPHTH-ACELL PERTUSSIS 5-2.5-18.5 LF-MCG/0.5 IM SUSP
0.5000 mL | Freq: Once | INTRAMUSCULAR | Status: AC
  Administered 2015-05-31: 0.5 mL via INTRAMUSCULAR

## 2015-05-31 NOTE — Progress Notes (Signed)
ROB: Patient doing well, no complaints.  For 28 week labs today, Tdap given.   Discussed cord blood banking, desires to breastfeed, undecided for contraception, to discuss at next visit.  Blood consent signed.  Counseled on monitoring further weight gain. F/u in 2-3 weeks.

## 2015-05-31 NOTE — Progress Notes (Signed)
Rob- gtt, tdap, bt signed. PHRS completed.

## 2015-06-01 LAB — CBC
HEMATOCRIT: 33.2 % — AB (ref 34.0–46.6)
Hemoglobin: 11.2 g/dL (ref 11.1–15.9)
MCH: 31.1 pg (ref 26.6–33.0)
MCHC: 33.7 g/dL (ref 31.5–35.7)
MCV: 92 fL (ref 79–97)
Platelets: 353 10*3/uL (ref 150–379)
RBC: 3.6 x10E6/uL — ABNORMAL LOW (ref 3.77–5.28)
RDW: 13.2 % (ref 12.3–15.4)
WBC: 10.2 10*3/uL (ref 3.4–10.8)

## 2015-06-01 LAB — GLUCOSE, 1 HOUR GESTATIONAL

## 2015-06-01 LAB — SPECIMEN STATUS REPORT

## 2015-06-02 LAB — GLUCOSE, 1 HOUR GESTATIONAL: GESTATIONAL DIABETES SCREEN: 96 mg/dL (ref 65–139)

## 2015-06-02 LAB — SPECIMEN STATUS REPORT

## 2015-06-08 ENCOUNTER — Telehealth: Payer: Self-pay | Admitting: Obstetrics and Gynecology

## 2015-06-08 ENCOUNTER — Observation Stay
Admission: EM | Admit: 2015-06-08 | Discharge: 2015-06-08 | Disposition: A | Discharge status: Home / Self Care | Payer: Medicaid Other | Attending: Obstetrics and Gynecology | Admitting: Obstetrics and Gynecology

## 2015-06-08 DIAGNOSIS — O99891 Other specified diseases and conditions complicating pregnancy: Secondary | ICD-10-CM

## 2015-06-08 DIAGNOSIS — M549 Dorsalgia, unspecified: Secondary | ICD-10-CM | POA: Diagnosis present

## 2015-06-08 DIAGNOSIS — O2343 Unspecified infection of urinary tract in pregnancy, third trimester: Secondary | ICD-10-CM | POA: Diagnosis present

## 2015-06-08 DIAGNOSIS — O26899 Other specified pregnancy related conditions, unspecified trimester: Secondary | ICD-10-CM

## 2015-06-08 DIAGNOSIS — B3749 Other urogenital candidiasis: Secondary | ICD-10-CM | POA: Diagnosis not present

## 2015-06-08 DIAGNOSIS — Z3A29 29 weeks gestation of pregnancy: Secondary | ICD-10-CM | POA: Insufficient documentation

## 2015-06-08 DIAGNOSIS — O9989 Other specified diseases and conditions complicating pregnancy, childbirth and the puerperium: Secondary | ICD-10-CM

## 2015-06-08 DIAGNOSIS — O2313 Infections of bladder in pregnancy, third trimester: Secondary | ICD-10-CM | POA: Diagnosis not present

## 2015-06-08 LAB — URINALYSIS COMPLETE WITH MICROSCOPIC (ARMC ONLY)
BILIRUBIN URINE: NEGATIVE
GLUCOSE, UA: NEGATIVE mg/dL
Ketones, ur: NEGATIVE mg/dL
Nitrite: NEGATIVE
PH: 6 (ref 5.0–8.0)
Protein, ur: 100 mg/dL — AB
SPECIFIC GRAVITY, URINE: 1.012 (ref 1.005–1.030)

## 2015-06-08 MED ORDER — NITROFURANTOIN MONOHYD MACRO 100 MG PO CAPS: 100.0000 mg | ORAL_CAPSULE | Freq: Two times a day (BID) | ORAL | Status: DC

## 2015-06-08 MED ORDER — FLUCONAZOLE 150 MG PO TABS: 150.0000 mg | ORAL_TABLET | ORAL | Status: DC

## 2015-06-08 MED ORDER — LACTATED RINGERS IV BOLUS (SEPSIS)
1000.0000 mL | Freq: Once | INTRAVENOUS | Status: AC
  Administered 2015-06-08 (×2): 500 mL via INTRAVENOUS

## 2015-06-08 MED ORDER — CEFTRIAXONE SODIUM 250 MG IJ SOLR
INTRAMUSCULAR | Status: DC
Start: 2015-06-08 — End: 2015-06-08
  Filled 2015-06-08: qty 250

## 2015-06-08 MED ORDER — ACETAMINOPHEN 500 MG PO TABS
ORAL_TABLET | ORAL | Status: AC
  Administered 2015-06-08: 1000 mg via ORAL
  Filled 2015-06-08: qty 2

## 2015-06-08 MED ORDER — ACETAMINOPHEN 500 MG PO TABS
1000.0000 mg | ORAL_TABLET | Freq: Once | ORAL | Status: AC
  Administered 2015-06-08: 1000 mg via ORAL

## 2015-06-08 MED ORDER — DEXTROSE 5 % IV SOLN
1.0000 g | Freq: Once | INTRAVENOUS | Status: AC
  Administered 2015-06-08: 1 g via INTRAVENOUS
  Filled 2015-06-08 (×3): qty 10

## 2015-06-08 NOTE — Discharge Instructions (Signed)
1. Increase po fluids. 2. Take macrobid  2 times a day for 7 days for UTI. 3. Take diflucan once every three days for 3 doses to treat yeast infection in urine. 4. F/U on Tuesday in clinic. 5. Return as needed if fever >100.5 develops.

## 2015-06-08 NOTE — Final Progress Note (Signed)
L&D OB Triage Note  SUBJECTIVE Michele Bailey is a 20 y.o. G1P0 female at [redacted]w[redacted]d, EDD Estimated Date of Delivery: 08/18/15 who presented to triage with complaints of UTI symptoms and vaginal discharge..    OBJECTIVE Nursing Evaluation: BP 106/67 mmHg  Pulse 82  Temp(Src) 98.8 F (37.1 C) (Oral)  Resp 16  Ht 5\' 4"  (1.626 m)  Wt 183 lb (83.008 kg)  BMI 31.40 kg/m2  LMP 11/11/2014 (Exact Date) findings significant for UTI; Mild rt CVAT (RN) UA: TNTC RBC; 2+ Heme; -Nitrites; 3+ Leucocytes and clumps; no ketones.  NST was performed and has been reviewed by me.  NST INTERPRETATION: Indications: Abdominal pain  Mode: External Baseline Rate (A): 135 bpm Variability: Moderate Accelerations: 15 x 15 Decelerations: None     Contraction Frequency (min): rare  ASSESSMENT Impression:  1. Pregnancy:  G1P0 at [redacted]w[redacted]d , EDD Estimated Date of Delivery: 08/18/15 2. Reactive NST 3. Hemorrhagic cystitis  PLAN  IVF LR 577ml/hr x 1 liter; Rocephin 1 gm iV; then  1. Increase PO fluids 2. Macrobid twice daily for 7 days 3. Diflucan every 3 days for 3 doses. 4. Return for fever>100.5, chills, sweats, flank pain. 5. Discharge home with bleeding/labor precautions.  6. Continue routine prenatal care.   Brayton Mars, MD

## 2015-06-08 NOTE — OB Triage Note (Signed)
Discharge instructions and preterm labor precautions reviewed with pt. Questions addressed. Discharge paperwork signed and copy to pt prior to pt leaving triage for discharge home.

## 2015-06-08 NOTE — Telephone Encounter (Signed)
Called pt, no answer. LM for pt informing her that she needs to be seen. Please call back for an appt.

## 2015-06-08 NOTE — Telephone Encounter (Signed)
Pt [redacted] wks pregnant .Marland Kitchen... has either uti or bladder inf/ itchy burning and now yellow clunky stuff/ urgency to pee/  pressure

## 2015-06-08 NOTE — OB Triage Note (Signed)
UA results reviewed with pt. FHT remains reactive, pt denies feeling much abd cramping since Tylenol was given. Continues to have some discomfort Rt lower back, flank area, rates pain 2/10 at present. LR and antibiotics infusing via pump. Cervical exam deferred. Pt confirms active fetal movement, denies any spotting, vaginal bleeding or leaking fluid. nged from initial exam. Discharge teaching related to uti and yeast inf and preterm labor precautions reviewed with pt, s/o and family present. Encouraged pt to drink plenty fluids throughout the day and night to stay well hydrated, including cranberry juice if tolerated. Advised pt to avoid strenuous activity, pelvic rest, warm shower, heating pack to lower back to relieve back discomfort and take medication as prescribed to completion. May take Tylenol as directed on label if needed for mild to moderate discomfort. Stressed importance of pt calling OB clinic tomorrow to schedule f/u appt for early next week, recommended pt be seen on Mon or Tues next week in clinic for f/u ut and yeast inf and explained to pt that she may still keep June 8th OB appt as scheduled. Pt reports she feels better overall since pain med, eat/drink and tolerating po well. VSS, afebrile. Discussed pt taking macrobid 2X/day x 7 days for uti symptoms and Diflucan 1 tab when received med, wait 2 day then take another for yeast inf and complete course of treatment. Pt and family verbalized understanding and agreement with plan.

## 2015-06-08 NOTE — OB Triage Note (Signed)
Pt here with urinary frequency, pressure when urinating, dark tinged blood on toilet tissue.

## 2015-06-11 LAB — URINE CULTURE

## 2015-06-13 ENCOUNTER — Telehealth: Payer: Self-pay

## 2015-06-16 NOTE — Telephone Encounter (Signed)
Lm x 3. Chart filed.

## 2015-06-22 ENCOUNTER — Ambulatory Visit (INDEPENDENT_AMBULATORY_CARE_PROVIDER_SITE_OTHER): Payer: PRIVATE HEALTH INSURANCE | Admitting: Obstetrics and Gynecology

## 2015-06-22 VITALS — BP 115/80 | HR 100 | Wt 189.7 lb

## 2015-06-22 DIAGNOSIS — O2342 Unspecified infection of urinary tract in pregnancy, second trimester: Secondary | ICD-10-CM

## 2015-06-22 DIAGNOSIS — O2602 Excessive weight gain in pregnancy, second trimester: Secondary | ICD-10-CM

## 2015-06-22 DIAGNOSIS — Z3403 Encounter for supervision of normal first pregnancy, third trimester: Secondary | ICD-10-CM

## 2015-06-22 LAB — POCT URINALYSIS DIPSTICK
Bilirubin, UA: NEGATIVE
Blood, UA: NEGATIVE
Glucose, UA: NEGATIVE
KETONES UA: NEGATIVE
LEUKOCYTES UA: NEGATIVE
NITRITE UA: NEGATIVE
PROTEIN UA: NEGATIVE
Spec Grav, UA: 1.01
UROBILINOGEN UA: NEGATIVE
pH, UA: 6.5

## 2015-06-22 MED ORDER — CITRANATAL HARMONY 27-1-260 MG PO CAPS: 27.0000 mg | ORAL_CAPSULE | Freq: Every day | ORAL | Status: DC

## 2015-06-22 NOTE — Progress Notes (Signed)
ROB: Patient denies complaints today. Was seen in [facility] triage 2 weeks ago for pelvic pain, was diagnosed with UTI and yeast infection. Notes completion of antibiotics last week. Will need TOC next visit. TWG 45 lbs, advised on limiting further weight gain in pregnancy, discussed healthier food choices and portion control, as patient notes that she typically only eats ~ twice daily. RTC in 2 weeks.

## 2015-06-23 ENCOUNTER — Telehealth: Payer: Self-pay | Admitting: Obstetrics and Gynecology

## 2015-06-23 NOTE — Telephone Encounter (Signed)
I never saw an RX from [facility], can the pt please bring another copy or have the health dept refax. Thanks

## 2015-06-23 NOTE — Telephone Encounter (Signed)
the [facility] program told Michele Bailey that she needed an RX for her to get whole milk instead of skim

## 2015-06-26 NOTE — Telephone Encounter (Signed)
Had to leave pt a msg instructing pt in what to do.

## 2015-06-27 NOTE — Telephone Encounter (Signed)
CALLED PT AGAIN AND GOT VM, LEFT ANOTHER MSG

## 2015-06-27 NOTE — Telephone Encounter (Signed)
Pt called and stated that the [facility] office said her MD has to write a rx and send it to their office stating she has to have whole milk instead of skim milk

## 2015-06-27 NOTE — Telephone Encounter (Signed)
Can we follow up with this today? Thanks

## 2015-06-28 NOTE — Telephone Encounter (Signed)
Called [facility] office left message for them to have [facility] form faxed to [place] as we cannot write a regular prescription for milk.

## 2015-06-30 ENCOUNTER — Ambulatory Visit: Payer: Medicaid Other | Admitting: Family Medicine

## 2015-07-03 ENCOUNTER — Ambulatory Visit: Payer: Medicaid Other | Admitting: Family Medicine

## 2015-07-03 ENCOUNTER — Encounter: Payer: Self-pay | Admitting: Family Medicine

## 2015-07-03 ENCOUNTER — Telehealth: Payer: Self-pay

## 2015-07-03 ENCOUNTER — Ambulatory Visit (INDEPENDENT_AMBULATORY_CARE_PROVIDER_SITE_OTHER): Payer: Medicaid Other | Admitting: Family Medicine

## 2015-07-03 VITALS — BP 110/72 | HR 68 | Temp 98.2°F | Resp 16 | Wt 191.0 lb

## 2015-07-03 DIAGNOSIS — O2686 Pruritic urticarial papules and plaques of pregnancy (PUPPP): Secondary | ICD-10-CM

## 2015-07-03 MED ORDER — TRIAMCINOLONE ACETONIDE 0.1 % EX CREA
1.0000 | TOPICAL_CREAM | Freq: Three times a day (TID) | CUTANEOUS | Status: DC
Start: 2015-07-03 — End: 2015-10-19

## 2015-07-03 NOTE — Patient Instructions (Signed)
Let us know if you get any new symptoms.

## 2015-07-03 NOTE — Progress Notes (Signed)
Subjective:     Patient ID: Michele Bailey, female   DOB: 1995/10/17, 20 y.o.   MRN: [id number]  HPI  Chief Complaint  Patient presents with  . Rash    X 3-4 days. Patient reports that she first noticed rash on her left hand. She reports that the rash is now on both hands. Patient reports that she has used calimine lotion with no relief.   First noticed a few small itchy bumps on her abdomen with a white area around them. Also developed a few on on the dorsum of her hands and in her left 3-4 finger web. She is [redacted] weeks pregnant.   Review of Systems     Objective:   Physical Exam  Constitutional: She appears well-developed and well-nourished. No distress.  Skin:  1-3 mm, mildly erythematous papules in the distribution noted above. No vesicles,crusting or drainage.       Assessment:    1. Pruritic urticarial papules and plaques of pregnancy (PUPPP) - triamcinolone cream (KENALOG) 0.1 %; Apply 1 application topically 3 (three) times daily. Apply 3 x day as needed for itching  Dispense: 30 g; Refill: 0    Plan:    F/u with ob as scheduled.

## 2015-07-03 NOTE — Telephone Encounter (Signed)
Called pt no answer, LM informing pt that per Harrell Gave at [facility] office she cannot have whole milk due to her weight gain in pregnancy being sufficient. Pt has to get 1% or skim. Documentation scanned in.

## 2015-07-06 ENCOUNTER — Encounter: Payer: PRIVATE HEALTH INSURANCE | Admitting: Obstetrics and Gynecology

## 2015-07-19 ENCOUNTER — Encounter: Payer: PRIVATE HEALTH INSURANCE | Admitting: Obstetrics and Gynecology

## 2015-07-19 ENCOUNTER — Encounter: Payer: Medicaid Other | Admitting: Obstetrics and Gynecology

## 2015-08-02 ENCOUNTER — Encounter: Payer: PRIVATE HEALTH INSURANCE | Admitting: Obstetrics and Gynecology

## 2015-08-03 ENCOUNTER — Ambulatory Visit (INDEPENDENT_AMBULATORY_CARE_PROVIDER_SITE_OTHER): Payer: Medicaid Other | Admitting: Obstetrics and Gynecology

## 2015-08-03 VITALS — BP 128/77 | HR 100 | Wt 197.5 lb

## 2015-08-03 DIAGNOSIS — Z3685 Encounter for antenatal screening for Streptococcus B: Secondary | ICD-10-CM

## 2015-08-03 DIAGNOSIS — Z3403 Encounter for supervision of normal first pregnancy, third trimester: Secondary | ICD-10-CM

## 2015-08-03 DIAGNOSIS — R0989 Other specified symptoms and signs involving the circulatory and respiratory systems: Secondary | ICD-10-CM

## 2015-08-03 DIAGNOSIS — Z113 Encounter for screening for infections with a predominantly sexual mode of transmission: Secondary | ICD-10-CM

## 2015-08-03 DIAGNOSIS — Z36 Encounter for antenatal screening of mother: Secondary | ICD-10-CM

## 2015-08-03 LAB — POCT URINALYSIS DIPSTICK
BILIRUBIN UA: NEGATIVE
Blood, UA: NEGATIVE
GLUCOSE UA: NEGATIVE
KETONES UA: NEGATIVE
NITRITE UA: NEGATIVE
PH UA: 7.5
Protein, UA: NEGATIVE
Spec Grav, UA: 1.01
Urobilinogen, UA: NEGATIVE

## 2015-08-03 NOTE — Progress Notes (Signed)
ROB: Patient c/o cold symptoms, does not desire to take anything. Patient notes bump on vagina.  Does report attempting to shave recently.  Small area of folliculitis noted on right labia majora. 36 week labs done today. RTc in 1 week.

## 2015-08-07 LAB — CULTURE, BETA STREP (GROUP B ONLY): STREP GP B CULTURE: NEGATIVE

## 2015-08-07 LAB — GC/CHLAMYDIA PROBE AMP
Chlamydia trachomatis, NAA: NEGATIVE
NEISSERIA GONORRHOEAE BY PCR: NEGATIVE

## 2015-08-10 ENCOUNTER — Encounter: Payer: Medicaid Other | Admitting: Obstetrics and Gynecology

## 2015-08-16 ENCOUNTER — Encounter: Payer: Self-pay | Admitting: Obstetrics and Gynecology

## 2015-08-16 ENCOUNTER — Ambulatory Visit (INDEPENDENT_AMBULATORY_CARE_PROVIDER_SITE_OTHER): Payer: Medicaid Other | Admitting: Obstetrics and Gynecology

## 2015-08-16 VITALS — BP 123/79 | HR 88 | Wt 196.8 lb

## 2015-08-16 DIAGNOSIS — Z3403 Encounter for supervision of normal first pregnancy, third trimester: Secondary | ICD-10-CM

## 2015-08-16 DIAGNOSIS — Z87440 Personal history of urinary (tract) infections: Secondary | ICD-10-CM

## 2015-08-16 LAB — POCT URINALYSIS DIPSTICK
Bilirubin, UA: NEGATIVE
Glucose, UA: NEGATIVE
KETONES UA: NEGATIVE
Nitrite, UA: NEGATIVE
PH UA: 7
PROTEIN UA: NEGATIVE
SPEC GRAV UA: 1.015
Urobilinogen, UA: NEGATIVE

## 2015-08-16 NOTE — Progress Notes (Signed)
ROB: Patient denies complaints.  36 week labs neg.  Labor precautions given.  Patient desires note so that she can discontinue working.  Note provided.  Desires to discuss IOL if no labor by delivery date.  Discussed, patient to call back after due date to be scheduled if no signs of labor.

## 2015-08-17 ENCOUNTER — Other Ambulatory Visit: Payer: Self-pay

## 2015-08-17 DIAGNOSIS — Z87440 Personal history of urinary (tract) infections: Secondary | ICD-10-CM

## 2015-08-17 NOTE — Addendum Note (Signed)
Addended by: Alphonsa Overall on: 08/17/2015 02:14 PM   Modules accepted: Orders

## 2015-08-19 LAB — URINE CULTURE

## 2015-08-21 ENCOUNTER — Telehealth: Payer: Self-pay | Admitting: Obstetrics and Gynecology

## 2015-08-21 NOTE — Telephone Encounter (Signed)
Please advise 

## 2015-08-21 NOTE — Telephone Encounter (Signed)
PT CALLED AND DR CHERRY TOLD HER THAT SHE COULD BE INDUCED ON 8/4, BUT SHE WANTED TO WAIT OVER THE WEEKEND TO SEE IF HER WATER BROKE AND IT DID NOT, SO SHE IS WANTING TO KNOW WHEN SHE CAN BE INDUCED.

## 2015-08-22 ENCOUNTER — Other Ambulatory Visit: Payer: Self-pay | Admitting: Obstetrics and Gynecology

## 2015-08-22 DIAGNOSIS — O480 Post-term pregnancy: Secondary | ICD-10-CM

## 2015-08-22 NOTE — Telephone Encounter (Signed)
Please inform patient that she can be scheduled for IOL this evening.  She will need to come in through the Emergency Room around 7:30 p.m. and be escorted to Labor and Delivery.  Be sure to eat a good dinner before coming.  The induction process can take anywhere from 24-36 hours.

## 2015-08-22 NOTE — Telephone Encounter (Signed)
Called pt informed her of the information below. Pt gave verbal understanding.  

## 2015-08-23 ENCOUNTER — Encounter: Payer: Medicaid Other | Admitting: Obstetrics and Gynecology

## 2015-08-23 ENCOUNTER — Inpatient Hospital Stay
Admission: EM | Admit: 2015-08-23 | Discharge: 2015-08-27 | DRG: 775 | Disposition: A | Discharge status: Home / Self Care | Payer: Medicaid Other | Attending: Obstetrics and Gynecology | Admitting: Obstetrics and Gynecology

## 2015-08-23 DIAGNOSIS — Z87891 Personal history of nicotine dependence: Secondary | ICD-10-CM

## 2015-08-23 DIAGNOSIS — Z8249 Family history of ischemic heart disease and other diseases of the circulatory system: Secondary | ICD-10-CM | POA: Diagnosis not present

## 2015-08-23 DIAGNOSIS — O9081 Anemia of the puerperium: Secondary | ICD-10-CM | POA: Diagnosis present

## 2015-08-23 DIAGNOSIS — Z8261 Family history of arthritis: Secondary | ICD-10-CM

## 2015-08-23 DIAGNOSIS — Z3403 Encounter for supervision of normal first pregnancy, third trimester: Secondary | ICD-10-CM | POA: Diagnosis not present

## 2015-08-23 DIAGNOSIS — Z8041 Family history of malignant neoplasm of ovary: Secondary | ICD-10-CM

## 2015-08-23 DIAGNOSIS — Z888 Allergy status to other drugs, medicaments and biological substances status: Secondary | ICD-10-CM | POA: Diagnosis not present

## 2015-08-23 DIAGNOSIS — Z79899 Other long term (current) drug therapy: Secondary | ICD-10-CM

## 2015-08-23 DIAGNOSIS — O480 Post-term pregnancy: Secondary | ICD-10-CM | POA: Diagnosis present

## 2015-08-23 DIAGNOSIS — O9962 Diseases of the digestive system complicating childbirth: Secondary | ICD-10-CM | POA: Diagnosis present

## 2015-08-23 DIAGNOSIS — Z3A40 40 weeks gestation of pregnancy: Secondary | ICD-10-CM

## 2015-08-23 DIAGNOSIS — K219 Gastro-esophageal reflux disease without esophagitis: Secondary | ICD-10-CM | POA: Diagnosis present

## 2015-08-23 MED ORDER — OXYTOCIN 40 UNITS IN LACTATED RINGERS INFUSION - SIMPLE MED
2.5000 [IU]/h | INTRAVENOUS | Status: DC
  Filled 2015-08-23: qty 1000

## 2015-08-23 MED ORDER — OXYCODONE-ACETAMINOPHEN 5-325 MG PO TABS: 2.0000 | ORAL_TABLET | ORAL | Status: DC | PRN

## 2015-08-23 MED ORDER — MISOPROSTOL 25 MCG QUARTER TABLET
25.0000 ug | ORAL_TABLET | ORAL | Status: DC | PRN
  Administered 2015-08-24 (×2): 25 ug via VAGINAL
  Filled 2015-08-23 (×3): qty 0.25
  Filled 2015-08-23: qty 1

## 2015-08-23 MED ORDER — OXYTOCIN BOLUS FROM INFUSION: 500.0000 mL | Freq: Once | INTRAVENOUS | Status: DC

## 2015-08-23 MED ORDER — LACTATED RINGERS IV SOLN
500.0000 mL | INTRAVENOUS | Status: DC | PRN
  Administered 2015-08-24: 500 mL via INTRAVENOUS

## 2015-08-23 MED ORDER — ACETAMINOPHEN 325 MG PO TABS: 650.0000 mg | ORAL_TABLET | ORAL | Status: DC | PRN

## 2015-08-23 MED ORDER — TERBUTALINE SULFATE 1 MG/ML IJ SOLN: 0.2500 mg | Freq: Once | INTRAMUSCULAR | Status: DC | PRN

## 2015-08-23 MED ORDER — OXYCODONE-ACETAMINOPHEN 5-325 MG PO TABS: 1.0000 | ORAL_TABLET | ORAL | Status: DC | PRN

## 2015-08-23 MED ORDER — SOD CITRATE-CITRIC ACID 500-334 MG/5ML PO SOLN
30.0000 mL | ORAL | Status: DC | PRN
  Administered 2015-08-24: 30 mL via ORAL
  Filled 2015-08-23: qty 15

## 2015-08-23 MED ORDER — LACTATED RINGERS IV SOLN
INTRAVENOUS | Status: DC
  Administered 2015-08-23 – 2015-08-24 (×2): via INTRAVENOUS

## 2015-08-23 MED ORDER — LIDOCAINE HCL (PF) 1 % IJ SOLN: 30.0000 mL | INTRAMUSCULAR | Status: DC | PRN

## 2015-08-23 MED ORDER — ONDANSETRON HCL 4 MG/2ML IJ SOLN
4.0000 mg | Freq: Four times a day (QID) | INTRAMUSCULAR | Status: DC | PRN
  Administered 2015-08-24: 4 mg via INTRAVENOUS
  Filled 2015-08-23: qty 2

## 2015-08-23 MED ORDER — BUTORPHANOL TARTRATE 1 MG/ML IJ SOLN
1.0000 mg | INTRAMUSCULAR | Status: DC | PRN
  Administered 2015-08-24 (×2): 1 mg via INTRAVENOUS
  Filled 2015-08-23 (×2): qty 1

## 2015-08-24 ENCOUNTER — Inpatient Hospital Stay: Payer: Medicaid Other | Admitting: Anesthesiology

## 2015-08-24 LAB — CHLAMYDIA/NGC RT PCR (ARMC ONLY)
CHLAMYDIA TR: NOT DETECTED
N GONORRHOEAE: NOT DETECTED

## 2015-08-24 LAB — CBC
HEMATOCRIT: 34.5 % — AB (ref 35.0–47.0)
HEMOGLOBIN: 12.1 g/dL (ref 12.0–16.0)
MCH: 32 pg (ref 26.0–34.0)
MCHC: 35.1 g/dL (ref 32.0–36.0)
MCV: 91 fL (ref 80.0–100.0)
Platelets: 327 10*3/uL (ref 150–440)
RBC: 3.79 MIL/uL — AB (ref 3.80–5.20)
RDW: 13.2 % (ref 11.5–14.5)
WBC: 12 10*3/uL — ABNORMAL HIGH (ref 3.6–11.0)

## 2015-08-24 MED ORDER — LIDOCAINE-EPINEPHRINE (PF) 1.5 %-1:200000 IJ SOLN
INTRAMUSCULAR | Status: DC | PRN
  Administered 2015-08-24: 2 mL

## 2015-08-24 MED ORDER — DOCUSATE SODIUM 100 MG PO CAPS
100.0000 mg | ORAL_CAPSULE | Freq: Two times a day (BID) | ORAL | Status: DC | PRN
  Administered 2015-08-24: 100 mg via ORAL
  Filled 2015-08-24: qty 1

## 2015-08-24 MED ORDER — SODIUM CHLORIDE 0.9 % IV SOLN
INTRAVENOUS | Status: DC | PRN
Start: 2015-08-24 — End: 2015-08-25
  Administered 2015-08-24 (×3): 5 mL via EPIDURAL

## 2015-08-24 MED ORDER — MISOPROSTOL 200 MCG PO TABS
ORAL_TABLET | ORAL | Status: AC
  Filled 2015-08-24: qty 4

## 2015-08-24 MED ORDER — FENTANYL 2.5 MCG/ML W/ROPIVACAINE 0.2% IN NS 100 ML EPIDURAL INFUSION (ARMC-ANES)
EPIDURAL | Status: AC
  Filled 2015-08-24: qty 100

## 2015-08-24 MED ORDER — OXYTOCIN 10 UNIT/ML IJ SOLN
INTRAMUSCULAR | Status: AC
  Filled 2015-08-24: qty 2

## 2015-08-24 MED ORDER — OXYTOCIN 40 UNITS IN LACTATED RINGERS INFUSION - SIMPLE MED: 1.0000 m[IU]/min | INTRAVENOUS | Status: DC

## 2015-08-24 MED ORDER — LIDOCAINE HCL (PF) 1 % IJ SOLN
INTRAMUSCULAR | Status: AC
  Filled 2015-08-24: qty 30

## 2015-08-24 MED ORDER — FENTANYL 2.5 MCG/ML W/ROPIVACAINE 0.2% IN NS 100 ML EPIDURAL INFUSION (ARMC-ANES)
EPIDURAL | Status: DC | PRN
  Administered 2015-08-24: 10 mL/h via EPIDURAL

## 2015-08-24 MED ORDER — AMMONIA AROMATIC IN INHA
RESPIRATORY_TRACT | Status: AC
  Filled 2015-08-24: qty 10

## 2015-08-24 MED ORDER — OXYTOCIN 40 UNITS IN LACTATED RINGERS INFUSION - SIMPLE MED
1.0000 m[IU]/min | INTRAVENOUS | Status: DC
  Administered 2015-08-24: 2 m[IU]/min via INTRAVENOUS

## 2015-08-24 MED ORDER — FENTANYL 2.5 MCG/ML W/ROPIVACAINE 0.2% IN NS 100 ML EPIDURAL INFUSION (ARMC-ANES)
EPIDURAL | Status: AC
  Administered 2015-08-24: 250 ug
  Filled 2015-08-24: qty 100

## 2015-08-24 NOTE — Anesthesia Preprocedure Evaluation (Signed)
Anesthesia Evaluation  Patient identified by MRN, date of birth, ID band Patient awake    Reviewed: Allergy & Precautions, NPO status , Patient's Chart, lab work & pertinent test results  History of Anesthesia Complications Negative for: history of anesthetic complications  Airway Mallampati: II  TM Distance: >3 FB Neck ROM: Full    Dental no notable dental hx.    Pulmonary former smoker,    breath sounds clear to auscultation- rhonchi (-) wheezing      Cardiovascular (-) hypertension(-) CAD and (-) Past MI  Rhythm:Regular Rate:Normal - Systolic murmurs and - Diastolic murmurs    Neuro/Psych negative neurological ROS  negative psych ROS   GI/Hepatic Neg liver ROS, GERD  ,  Endo/Other  negative endocrine ROSneg diabetes  Renal/GU negative Renal ROS     Musculoskeletal negative musculoskeletal ROS (+)   Abdominal Gravid abdomen  Peds  Hematology negative hematology ROS (+)   Anesthesia Other Findings   Reproductive/Obstetrics                             Anesthesia Physical Anesthesia Plan  ASA: II  Anesthesia Plan: Epidural   Post-op Pain Management:    Induction:   Airway Management Planned:   Additional Equipment:   Intra-op Plan:   Post-operative Plan:   Informed Consent: I have reviewed the patients History and Physical, chart, labs and discussed the procedure including the risks, benefits and alternatives for the proposed anesthesia with the patient or authorized representative who has indicated his/her understanding and acceptance.     Plan Discussed with: CRNA and Anesthesiologist  Anesthesia Plan Comments:         Lab Results  Component Value Date   WBC 12.0 (H) 08/23/2015   HGB 12.1 08/23/2015   HCT 34.5 (L) 08/23/2015   MCV 91.0 08/23/2015   PLT 327 08/23/2015    Anesthesia Quick Evaluation

## 2015-08-24 NOTE — H&P (Signed)
Obstetric History and Physical  Michele Bailey is a 20 y.o. G1P0 with IUP at [redacted]w[redacted]d presenting for elective IOL for postdates. Patient states she has been having  infrequent contractions, none vaginal bleeding, intact membranes, with active fetal movement.    Prenatal Course Source of Care: [facility] with onset of care at 11 weeks Pregnancy complications or risks: Patient Active Problem List   Diagnosis Date Noted  . Post-dates pregnancy 08/23/2015  . Back pain affecting pregnancy 06/08/2015  . Supervision of normal first pregnancy in third trimester 04/05/2015  . 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 Acid reflux 02/04/2015   She plans to breastfeed She desires oral contraceptives (estrogen/progesterone) for postpartum contraception.   Prenatal labs and studies: ABO, Rh: B/Positive/-- (12/22 0400) Antibody: Negative (12/22 0400) Rubella: <20.0 (12/22 0400) RPR: Non Reactive (12/22 0400)  HBsAg: Negative (12/22 0400)  HIV: Non Reactive (12/22 0400)  GBS: Negative (08/17/2015) 1 hr Glucola  Normal (96) Genetic screening normal Anatomy US normal   Past Medical History:  Diagnosis Date  . Acid reflux 02/04/2015  . Herpes simplex type 1 infection 02/04/2015    Past Surgical History:  Procedure Laterality Date  . NO PAST SURGERIES      OB History  Gravida Para Term Preterm AB Living  1            SAB TAB Ectopic Multiple Live Births               # Outcome Date GA Lbr Len/2nd Weight Sex Delivery Anes PTL Lv  1 Current             Obstetric Comments  MGF- had twin that passed away age 42yr.  Fob family has hx of twins    Social History   Social History  . Marital status: Single    Spouse name: N/A  . Number of children: N/A  . Years of education: N/A   Occupational History  . unemployed    Social History Main Topics  . Smoking status: Former Research scientist (life sciences)  . Smokeless tobacco: None  . Alcohol use No  . Drug use: No  . Sexual activity:  Yes   Other Topics Concern  . None   Social History Narrative  . None    Family History  Problem Relation Age of Onset  . Rheum arthritis Mother   . Migraines Mother   . Thyroid disease Mother     hypothyroid  . Hyperlipidemia Father   . Hypertension Father   . Rheum arthritis Maternal Grandfather   . Ovarian cancer Other     mother's great aunt  . Cancer - Other      fob-father died at age 37y from a type of ca that he was born with    Prescriptions Prior to Admission  Medication Sig Dispense Refill Last Dose  . Prenat-FeFmCb-DSS-FA-DHA w/o A (CITRANATAL HARMONY) 27-1-260 MG CAPS Take 27 mg by mouth daily. 30 capsule 11 Past Month at Unknown time  . triamcinolone cream (KENALOG) 0.1 % Apply 1 application topically 3 (three) times daily. Apply 3 x day as needed for itching (Patient not taking: Reported on 08/24/2015) 30 g 0 Not Taking at Unknown time    Allergies  Allergen Reactions  . Augmentin [Amoxicillin-Pot Clavulanate] Nausea Only    Willing to take med if necessary with nausea med    Review of Systems: Negative except for what is mentioned in HPI.  Physical Exam: BP 95/63   Pulse  72   Temp 98.6 F (37 C) (Oral)   LMP 11/11/2014 (Exact Date)  CONSTITUTIONAL: Well-developed, well-nourished female in no acute distress.  HENT:  Normocephalic, atraumatic, External right and left ear normal. Oropharynx is clear and moist EYES: Conjunctivae and EOM are normal. Pupils are equal, round, and reactive to light. No scleral icterus.  NECK: Normal range of motion, supple, no masses SKIN: Skin is warm and dry. No rash noted. Not diaphoretic. No erythema. No pallor. NEUROLOGIC: Alert and oriented to person, place, and time. Normal reflexes, muscle tone coordination. No cranial nerve deficit noted. PSYCHIATRIC: Normal mood and affect. Normal behavior. Normal judgment and thought content. CARDIOVASCULAR: Normal heart rate noted, regular rhythm RESPIRATORY: Effort and breath  sounds normal, no problems with respiration noted ABDOMEN: Soft, nontender, nondistended, gravid. MUSCULOSKELETAL: Normal range of motion. No edema and no tenderness. 2+ distal pulses.  Cervical Exam: Dilatation 1 cm   Effacement 50%   Station -3   Presentation: cephalic FHT:  Baseline rate 140 bpm   Variability moderate  Accelerations present   Decelerations none Contractions: occasional    Pertinent Labs/Studies:   Results for orders placed or performed during the hospital encounter of 08/23/15 (from the past 24 hour(s))  CBC     Status: Abnormal   Collection Time: 08/23/15 11:43 PM  Result Value Ref Range   WBC 12.0 (H) 3.6 - 11.0 K/uL   RBC 3.79 (L) 3.80 - 5.20 MIL/uL   Hemoglobin 12.1 12.0 - 16.0 g/dL   HCT 34.5 (L) 35.0 - 47.0 %   MCV 91.0 80.0 - 100.0 fL   MCH 32.0 26.0 - 34.0 pg   MCHC 35.1 32.0 - 36.0 g/dL   RDW 13.2 11.5 - 14.5 %   Platelets 327 150 - 440 K/uL  Chlamydia/NGC rt PCR ([facility] only)     Status: None   Collection Time: 08/24/15  5:07 AM  Result Value Ref Range   Specimen source GC/Chlam URINE, RANDOM    Chlamydia Tr NOT DETECTED NOT DETECTED   N gonorrhoeae NOT DETECTED NOT DETECTED    Assessment : Carlia Susko Dauenhauer is a 20 y.o. G1P0 at [redacted]w[redacted]d being admitted for post-dates elective IOL.  Plan: Labor:  Induction with Cytotec, per protocol FWB: Reassuring fetal heart tracing.  GBS negative Delivery plan: Hopeful for vaginal delivery  [name], MD [facility]

## 2015-08-24 NOTE — Progress Notes (Signed)
Intrapartum Progress Note  S: Patient noting pain meds have worn off.    O: Blood pressure 131/81, pulse 73, temperature 97.1 F (36.2 C), temperature source Oral, last menstrual period 11/11/2014. Gen App: NAD, mild to moderate distress with contractions Abdomen: soft, gravid FHT: baseline 140 bpm.  Accels present.  Decels absent. moderate in degree variability.   Tocometer: contractions q 2-3 minutes Cervix: 4.5/60/-3 to -2/c/SROM Extremities: Nontender, no edema  Labs: No new labs  Assessment:  1: SIUP at [redacted]w[redacted]d 2. Elective IOL for post-dates 3. SROM (clear fluid)  Plan:  1. Expectant management 2. Pain management with IV pain meds until 5 cm, then may have epidural.    [name], MD 08/24/2015 12:29 PM

## 2015-08-24 NOTE — Progress Notes (Signed)
Talked to Marcelline Mates, MD about patient's medical history of HSV II and patient not currently on prophylactic treatment. Marcelline Mates, MD stated that as long as the nurse does not see any external lesions proceed with plan of care of induction of labor. Patient has no complaints at this time and will continue to monitor.

## 2015-08-24 NOTE — Anesthesia Procedure Notes (Signed)
Epidural Patient location during procedure: OB Start time: 08/24/2015 2:15 PM End time: 08/24/2015 2:30 PM  Staffing Anesthesiologist: Emmie Niemann Performed: anesthesiologist   Preanesthetic Checklist Completed: patient identified, site marked, surgical consent, pre-op evaluation, timeout performed, IV checked, risks and benefits discussed and monitors and equipment checked  Epidural Patient position: sitting Prep: ChloraPrep Patient monitoring: heart rate, continuous pulse ox and blood pressure Approach: midline Location: L4-L5 Injection technique: LOR saline  Needle:  Needle type: Tuohy  Needle gauge: 18 G Needle length: 9 cm and 9 Needle insertion depth: 6.5 cm Catheter type: closed end flexible Catheter size: 20 Guage Catheter at skin depth: 10.5 cm Test dose: negative (0.125% bupivacaine)  Assessment Events: blood not aspirated, injection not painful, no injection resistance, negative IV test and no paresthesia  Additional Notes   Patient tolerated the insertion well without complications.Reason for block:procedure for pain

## 2015-08-24 NOTE — Progress Notes (Signed)
Intrapartum Progress Note  S: Patient s/p epidural. Doing well.   O: Blood pressure (!) 105/92, pulse 92, temperature 98 F (36.7 C), temperature source Oral, last menstrual period 11/11/2014. Gen App: NAD, mild to moderate distress with contractions Abdomen: soft, gravid FHT: baseline 140 bpm.  Accels present.  Decels absent. moderate in degree variability.   Tocometer: contractions q 2-3 minutes Cervix: 5/80/-2/c/SROM Extremities: Nontender, no edema  Labs: No new labs  Assessment:  1: SIUP at [redacted]w[redacted]d 2. Elective IOL for post-dates 3. SROM (clear fluid)  Plan:  1. Expectant management 2. IUPC placed due to difficulty picking up contractions.  3. Will begin pitocin for augmentation as needed.   [name], MD 08/24/2015 6:00 PM

## 2015-08-24 NOTE — Progress Notes (Signed)
Intrapartum Progress Note  S: . Doing well.  No complaints.   O: Blood pressure 120/82, pulse (!) 114, temperature 98.5 F (36.9 C), temperature source Oral, last menstrual period 11/11/2014. Gen App: NAD, mild to moderate distress with contractions Abdomen: soft, gravid FHT: baseline 140 bpm.  Accels present.  Decels absent. moderate in degree variability.   Tocometer: contractions q 2-3 minutes Cervix: 7/100/-1/c/SROM Extremities: Nontender, no edema   Pitocin: 9 mIU  Labs: No new labs  Assessment:  1: SIUP at [redacted]w[redacted]d 2. Elective IOL for post-dates 3. SROM (clear fluid)  Plan:  1. Pitocin for augmentation. management 2. Anticipate vaginal delivery 3. Monitor for s/s of chorioamnionitis   [name], MD 08/24/2015 9:18 PM

## 2015-08-25 DIAGNOSIS — Z3403 Encounter for supervision of normal first pregnancy, third trimester: Secondary | ICD-10-CM

## 2015-08-25 LAB — RUBELLA SCREEN: RUBELLA: 1.97 {index} (ref >0.99)

## 2015-08-25 LAB — RPR: RPR: NONREACTIVE

## 2015-08-25 LAB — VARICELLA ZOSTER ANTIBODY, IGG: Varicella IgG: <135 index — ABNORMAL LOW (ref >165)

## 2015-08-25 MED ORDER — SENNOSIDES-DOCUSATE SODIUM 8.6-50 MG PO TABS
2.0000 | ORAL_TABLET | ORAL | Status: DC
  Filled 2015-08-25: qty 2

## 2015-08-25 MED ORDER — COCONUT OIL OIL
1.0000 "application " | TOPICAL_OIL | Status: DC | PRN
  Administered 2015-08-25: 1 via TOPICAL
  Filled 2015-08-25: qty 120

## 2015-08-25 MED ORDER — IBUPROFEN 600 MG PO TABS
600.0000 mg | ORAL_TABLET | Freq: Four times a day (QID) | ORAL | Status: DC
Start: 2015-08-25 — End: 2015-08-27
  Administered 2015-08-25 – 2015-08-27 (×6): 600 mg via ORAL
  Filled 2015-08-25 (×9): qty 1

## 2015-08-25 MED ORDER — IBUPROFEN 600 MG PO TABS
ORAL_TABLET | ORAL | Status: AC
Start: 2015-08-25 — End: 2015-08-25
  Administered 2015-08-25: 600 mg via ORAL
  Filled 2015-08-25: qty 1

## 2015-08-25 MED ORDER — WITCH HAZEL-GLYCERIN EX PADS: 1.0000 "application " | MEDICATED_PAD | CUTANEOUS | Status: DC | PRN

## 2015-08-25 MED ORDER — SIMETHICONE 80 MG PO CHEW
80.0000 mg | CHEWABLE_TABLET | ORAL | Status: DC | PRN
  Administered 2015-08-26: 80 mg via ORAL
  Filled 2015-08-25: qty 1

## 2015-08-25 MED ORDER — ZOLPIDEM TARTRATE 5 MG PO TABS: 5.0000 mg | ORAL_TABLET | Freq: Every evening | ORAL | Status: DC | PRN

## 2015-08-25 MED ORDER — TETANUS-DIPHTH-ACELL PERTUSSIS 5-2.5-18.5 LF-MCG/0.5 IM SUSP: 0.5000 mL | Freq: Once | INTRAMUSCULAR | Status: DC

## 2015-08-25 MED ORDER — ONDANSETRON HCL 4 MG PO TABS: 4.0000 mg | ORAL_TABLET | ORAL | Status: DC | PRN

## 2015-08-25 MED ORDER — DIPHENHYDRAMINE HCL 25 MG PO CAPS: 25.0000 mg | ORAL_CAPSULE | Freq: Four times a day (QID) | ORAL | Status: DC | PRN

## 2015-08-25 MED ORDER — DIBUCAINE 1 % RE OINT: 1.0000 "application " | TOPICAL_OINTMENT | RECTAL | Status: DC | PRN

## 2015-08-25 MED ORDER — ACETAMINOPHEN-CODEINE #3 300-30 MG PO TABS: 1.0000 | ORAL_TABLET | ORAL | Status: DC | PRN

## 2015-08-25 MED ORDER — LACTATED RINGERS IV SOLN: INTRAVENOUS | Status: DC

## 2015-08-25 MED ORDER — PRENATAL MULTIVITAMIN CH
1.0000 | ORAL_TABLET | Freq: Every day | ORAL | Status: DC
  Administered 2015-08-25 – 2015-08-27 (×3): 1 via ORAL
  Filled 2015-08-25 (×4): qty 1

## 2015-08-25 MED ORDER — BENZOCAINE-MENTHOL 20-0.5 % EX AERO
1.0000 "application " | INHALATION_SPRAY | CUTANEOUS | Status: DC | PRN
  Filled 2015-08-25: qty 56

## 2015-08-25 MED ORDER — ACETAMINOPHEN 325 MG PO TABS: 650.0000 mg | ORAL_TABLET | ORAL | Status: DC | PRN

## 2015-08-25 MED ORDER — ONDANSETRON HCL 4 MG/2ML IJ SOLN: 4.0000 mg | INTRAMUSCULAR | Status: DC | PRN

## 2015-08-25 NOTE — Anesthesia Postprocedure Evaluation (Signed)
Anesthesia Post Note  Patient: Michele Bailey  Procedure(s) Performed: * No procedures listed *  Patient location during evaluation: Mother Baby Anesthesia Type: Epidural Level of consciousness: awake and alert Pain management: pain level controlled Vital Signs Assessment: post-procedure vital signs reviewed and stable Respiratory status: spontaneous breathing, nonlabored ventilation and respiratory function stable Cardiovascular status: stable Postop Assessment: no headache, no backache and epidural receding Anesthetic complications: no    Last Vitals:  Vitals:   08/25/15 0436 08/25/15 0735  BP: 122/73 116/71  Pulse: (!) 102 96  Resp:  18  Temp:  36.6 C    Last Pain:  Vitals:   08/25/15 0735  TempSrc: Oral  PainSc:                  Kristina Bertone

## 2015-08-25 NOTE — Anesthesia Post-op Follow-up Note (Signed)
  Anesthesia Pain Follow-up Note  Patient: Michele Bailey  Day #: 1  Date of Follow-up: 08/25/2015 Time: 8:36 AM  Last Vitals:  Vitals:   08/25/15 0436 08/25/15 0735  BP: 122/73 116/71  Pulse: (!) 102 96  Resp:  18  Temp:  36.6 C    Level of Consciousness: alert  Pain: mild   Side Effects:None  Catheter Site Exam:clean, dry     Plan: D/C from anesthesia care  Brayden Brodhead

## 2015-08-26 LAB — CBC
HCT: 24.4 % — ABNORMAL LOW (ref 35.0–47.0)
HEMOGLOBIN: 8.6 g/dL — AB (ref 12.0–16.0)
MCH: 32.3 pg (ref 26.0–34.0)
MCHC: 35.3 g/dL (ref 32.0–36.0)
MCV: 91.5 fL (ref 80.0–100.0)
Platelets: 266 10*3/uL (ref 150–440)
RBC: 2.67 MIL/uL — AB (ref 3.80–5.20)
RDW: 13.4 % (ref 11.5–14.5)
WBC: 16.9 10*3/uL — ABNORMAL HIGH (ref 3.6–11.0)

## 2015-08-26 NOTE — Progress Notes (Signed)
Post Partum Day # 1, s/p SVD  Subjective: no complaints, up ad lib, voiding and tolerating PO  Objective: Temp:  [97.7 F (36.5 C)-98.6 F (37 C)] 98.5 F (36.9 C) (08/12 0815) Pulse Rate:  [75-103] 87 (08/12 0909) Resp:  [16-20] 16 (08/12 0342) BP: (59-133)/(28-86) 128/82 (08/12 0909) SpO2:  [98 %-100 %] 100 % (08/12 0815)  Physical Exam:  General: alert and no distress  Lungs: clear to auscultation bilaterally Breasts: normal appearance, no masses or tenderness Heart: regular rate and rhythm, S1, S2 normal, no murmur, click, rub or gallop Pelvis: Lochia: appropriate, Uterine Fundus: firm Extremities: DVT Evaluation: Negative Homan's sign.  No cords or calf tenderness. No significant calf/ankle edema.   Recent Labs  08/23/15 2343 08/26/15 0540  HGB 12.1 8.6*  HCT 34.5* 24.4*    Assessment/Plan: Plan for discharge tomorrow, Breastfeeding, Lactation consult and Contraception undecided   LOS: 3 days   [place]

## 2015-08-27 MED ORDER — FERROUS SULFATE 325 (65 FE) MG PO TABS: 325.0000 mg | ORAL_TABLET | Freq: Two times a day (BID) | ORAL | 1 refills | Status: DC

## 2015-08-27 MED ORDER — DOCUSATE SODIUM 100 MG PO CAPS: 100.0000 mg | ORAL_CAPSULE | Freq: Two times a day (BID) | ORAL | 2 refills | Status: DC | PRN

## 2015-08-27 MED ORDER — IBUPROFEN 600 MG PO TABS: 600.0000 mg | ORAL_TABLET | Freq: Four times a day (QID) | ORAL | 1 refills | Status: DC

## 2015-08-27 MED ORDER — VARICELLA VIRUS VACCINE LIVE 1350 PFU/0.5ML IJ SUSR
0.5000 mL | Freq: Once | INTRAMUSCULAR | Status: AC
  Administered 2015-08-27: 0.5 mL via SUBCUTANEOUS
  Filled 2015-08-27 (×2): qty 0.5

## 2015-08-27 NOTE — Discharge Summary (Signed)
Obstetric Discharge Summary Reason for Admission: induction of labor for post-dates pregnancy Prenatal Procedures: ultrasound Intrapartum Procedures: spontaneous vaginal delivery Postpartum Procedures: Varivax Complications-Operative and Postpartum: none Hemoglobin  Date Value Ref Range Status  08/26/2015 8.6 (L) 12.0 - 16.0 g/dL Final   HGB  Date Value Ref Range Status  01/30/2013 13.5 12.0 - 16.0 g/dL Final   HCT  Date Value Ref Range Status  08/26/2015 24.4 (L) 35.0 - 47.0 % Final   Hematocrit  Date Value Ref Range Status  05/31/2015 33.2 (L) 34.0 - 46.6 % Final    Physical Exam:  General: alert and no distress Lochia: appropriate Uterine Fundus: firm Incision: None DVT Evaluation: Negative Homan's sign. No cords or calf tenderness. No significant calf/ankle edema.  Discharge Diagnoses: Term Pregnancy-delivered  Discharge Information: Date: 08/27/2015 Activity: pelvic rest Diet: routine Medications: PNV, Ibuprofen, Colace and Iron Condition: stable Instructions: refer to practice specific booklet Discharge to: home [place], MD Follow up in 6 week(s).   Specialties:  Obstetrics and Gynecology, Radiology Contact information: [place] [address] [phone]           Newborn Data: Live born female  Birth Weight: 7 lb 4.1 oz (3290 g) APGAR: 1, 9  Home with mother.  [name] 08/27/2015, 8:24 AM

## 2015-08-27 NOTE — Progress Notes (Signed)
Post Partum Day # 2, s/p SVD  Subjective: no complaints, up ad lib, voiding and tolerating PO.  Breastfeeding has improved.   Objective: Temp:  [98.6 F (37 C)-98.8 F (37.1 C)] 98.8 F (37.1 C) (08/13 0818) Pulse Rate:  [79-103] 103 (08/13 0818) Resp:  [18-20] 18 (08/13 0818) BP: (128-142)/(73-82) 142/80 (08/13 0818) SpO2:  [100 %] 100 % (08/13 0818)  Physical Exam:  General: alert and no distress  Lungs: clear to auscultation bilaterally Breasts: normal appearance, no masses or tenderness Heart: regular rate and rhythm, S1, S2 normal, no murmur, click, rub or gallop Pelvis: Lochia: appropriate, Uterine Fundus: firm Extremities: DVT Evaluation: Negative Homan's sign.  No cords or calf tenderness. No significant calf/ankle edema.   Recent Labs  08/26/15 0540  HGB 8.6*  HCT 24.4*    Assessment/Plan: Discharge home, Breastfeeding and Contraception undecided. Anemia postpartum, asymptomatic.  Will treat with PO iron supplementation.    LOS: 4 days   [place]

## 2015-08-27 NOTE — Discharge Instructions (Signed)
Follow up sooner with fever, problems breathing, pain not helped by medications, severe depression( more than just baby blues, wanting to hurt yourself or the baby), severe bleeding ( saturating more than one pad an hour or large palm sized clots), no heavy lifting , no driving while taking narcotics, no douches, intercourse, tampons or enemas for 6 weeks General Postpartum Discharge Instructions ° °Do not drink alcohol or take tranquilizers.  °Do not take medicine that has not been prescribed by your doctor.  °Take showers instead of baths until your doctor gives you permission to take baths.  °No sexual intercourse or placement of anything in the vagina for 6 weeks or as instructed by your doctor. °Only take prescription or over-the-counter medicines  for pain, discomfort, or fever as directed by your doctor. Take medicines (antibiotics) that kill germs if they are prescribed for you. °  °Call the office or go to the Emergency Room if:  °You feel sick to your stomach (nauseous).  °You start to throw up (vomit).  °You have trouble eating or drinking.  °You have an oral temperature above 101.  °You have constipation that is not helped by adjusting diet or increasing fluid intake. Pain medicines are a common cause of constipation.  °You have foul smelling vaginal discharge or odor.  °You have bleeding requiring changing more than 1 pad per hour. °You have any other concerns. ° °SEEK IMMEDIATE MEDICAL CARE IF:  °You have persistent dizziness.  °You have difficulty breathing or shortness of breath.  °You have an oral temperature above 102.5, not controlled by medicine.  ° ° ° ° °

## 2015-08-27 NOTE — Progress Notes (Signed)
All discharge instructions given to patient and she voices understanding of all instructions given. She will make her own f/u appt.  Prescriptions given. Patient discharged home with infant in arms in wheelchair escorted out by cna

## 2015-10-09 DIAGNOSIS — Z0289 Encounter for other administrative examinations: Secondary | ICD-10-CM

## 2015-10-19 ENCOUNTER — Ambulatory Visit (INDEPENDENT_AMBULATORY_CARE_PROVIDER_SITE_OTHER): Payer: Medicaid Other | Admitting: Obstetrics and Gynecology

## 2015-10-19 ENCOUNTER — Encounter: Payer: Self-pay | Admitting: Obstetrics and Gynecology

## 2015-10-19 DIAGNOSIS — O9081 Anemia of the puerperium: Secondary | ICD-10-CM

## 2015-10-19 DIAGNOSIS — N760 Acute vaginitis: Secondary | ICD-10-CM

## 2015-10-19 DIAGNOSIS — Z30011 Encounter for initial prescription of contraceptive pills: Secondary | ICD-10-CM

## 2015-10-19 LAB — POCT URINE PREGNANCY: PREG TEST UR: NEGATIVE

## 2015-10-19 MED ORDER — NORETHIN ACE-ETH ESTRAD-FE 1-20 MG-MCG PO TABS: 1.0000 | ORAL_TABLET | Freq: Every day | ORAL | 11 refills | Status: DC

## 2015-10-19 MED ORDER — DOCUSATE SODIUM 100 MG PO CAPS: 100.0000 mg | ORAL_CAPSULE | Freq: Two times a day (BID) | ORAL | 2 refills | Status: DC | PRN

## 2015-10-19 NOTE — Progress Notes (Signed)
   OBSTETRICS POSTPARTUM CLINIC PROGRESS NOTE  Subjective:     Michele Bailey is a 20 y.o. G6P1001 female who presents for a postpartum visit. She is 8 weeks postpartum following a spontaneous vaginal delivery. I have fully reviewed the prenatal and intrapartum course. The delivery was at 55 gestational weeks.  Anesthesia: epidural. Postpartum course has been well. Baby's course has been well. Baby is feeding by bottle - Similac Advance. Bleeding: patient has resumed menses, with Patient's last menstrual period was 10/01/2015 (exact date).. Bowel function is abnormal: mild constipation. Bladder function is normal. Patient is sexually active. Contraception method desired is OCP (estrogen/progesterone), currently using condoms. Postpartum depression screening: negative.  The following portions of the patient's history were reviewed and updated as appropriate: allergies, current medications, past family history, past medical history, past social history, past surgical history and problem list.  Review of Systems A comprehensive review of systems was negative except for: Genitourinary: positive for recurrent yeast infections   Objective:    BP (!) 105/53   Pulse 74   Ht 5\' 4"  (1.626 m)   Wt 191 lb 6.4 oz (86.8 kg)   LMP 10/01/2015 (Exact Date)   Breastfeeding? No   BMI 32.85 kg/m   General:  alert and no distress   Breasts:  inspection negative, no nipple discharge or bleeding, no masses or nodularity palpable  Lungs: clear to auscultation bilaterally  Heart:  regular rate and rhythm, S1, S2 normal, no murmur, click, rub or gallop  Abdomen: soft, non-tender; bowel sounds normal; no masses,  no organomegaly.    Vulva:  normal  Vagina: normal vagina, no discharge, exudate, lesion, or erythema  Cervix:  no cervical motion tenderness and no lesions  Corpus: normal size, contour, position, consistency, mobility, non-tender  Adnexa:  normal adnexa and no mass, fullness, tenderness  Rectal Exam:  Not performed.         Labs:  Lab Results  Component Value Date   HGB 8.6 (L) 08/26/2015    Assessment:   Routine postpartum exam.   Postpartum anemia Encounter for initiation of OCPs H/o recurrent vaginitis  Plan:    1. Contraception: OCP (estrogen/progesterone).  Prescribed Junel Fe.  2. Will check Hgb for h/o anemia.  3. Discussed conservative management for prevention of recurrent vaginitis symptoms.  4. Follow up in: 4-6 months for annual exam, or sooner as needed.    [name], MD [facility]

## 2015-10-19 NOTE — Patient Instructions (Signed)
PREVENTION OF RECURRENT VAGINITIS:   1. First and foremost, eat a healthy diet rich in fruits and vegetables and low in sugar and refined carbohydrates. Studies have shown that yeast and harmful bacteria are more likely to be a problem in people with high sugar diets. One food believed to be particularly helpful is garlic. 2. Support your immune system: reduce stress, get adequate sleep and exercise regularly. 3. Eat plain, probiotic yogurt daily or take a daily vaginal probiotic (can be found in the feminine aisle at any pharmacy) containing Acidophillus (they should contain at least 1 billion CFU's- this is information found on the bottle).  4. Avoid harsh soaps, and detergents.  Can use vaginal washes like Vagisil or Summer's Eve.  5.Avoid routine douching, and if you notice that sex can trigger infections for you, use condoms. For some women who get infections easily, both douching and the alkaline nature of semen can disrupt the vaginal pH.  

## 2015-10-20 LAB — HEMOGLOBIN AND HEMATOCRIT, BLOOD
HEMOGLOBIN: 10.6 g/dL — AB (ref 11.1–15.9)
Hematocrit: 33.3 % — ABNORMAL LOW (ref 34.0–46.6)

## 2015-10-26 ENCOUNTER — Telehealth: Payer: Self-pay | Admitting: Obstetrics and Gynecology

## 2015-10-26 NOTE — Telephone Encounter (Signed)
Patient called stating she needs clearance to return to work from maternity leave. She will pick up when ready. Thanks

## 2015-10-27 NOTE — Telephone Encounter (Signed)
Letter done, pt notified via mychart.

## 2015-11-15 ENCOUNTER — Telehealth: Payer: Self-pay | Admitting: Obstetrics and Gynecology

## 2015-11-15 NOTE — Telephone Encounter (Signed)
Michele Bailey needs a letter for back to work w/o restrictions, we have those up here if Dr. Marcelline Mates gives the ok. And she needs something faxed to [employer] with dates 10/3-11/3.  Fax [phone] Or   [phone]

## 2015-11-16 NOTE — Telephone Encounter (Signed)
Michele Bailey, can you check the drawer up front, I feel like I already did this but want to be sure thanks

## 2015-12-13 ENCOUNTER — Telehealth: Payer: Self-pay | Admitting: *Deleted

## 2015-12-13 NOTE — Telephone Encounter (Signed)
Notified pt letter was faxed to employer

## 2015-12-26 ENCOUNTER — Telehealth: Payer: Self-pay | Admitting: Family Medicine

## 2015-12-26 ENCOUNTER — Other Ambulatory Visit: Payer: Self-pay | Admitting: Family Medicine

## 2015-12-26 DIAGNOSIS — N3090 Cystitis, unspecified without hematuria: Secondary | ICD-10-CM

## 2015-12-26 MED ORDER — NITROFURANTOIN MONOHYD MACRO 100 MG PO CAPS: 100.0000 mg | ORAL_CAPSULE | Freq: Two times a day (BID) | ORAL | 0 refills | Status: DC

## 2015-12-26 NOTE — Telephone Encounter (Signed)
Spoke with patient on the phone she states for the past two days she has had frequency of urination and is unable to completley empty her bladder. Patient reports discomfort when urinating, she states that she has tried otc Cranberry pills to help with urination. Patient reports that her family has been without a car for the past week and she has no mode of transportation. Please advise. KW

## 2015-12-26 NOTE — Telephone Encounter (Signed)
I have sent in 3 days of Macrobid generic. Push fluids. If not improving to come see [place] or go to Urgent Care.

## 2015-12-26 NOTE — Telephone Encounter (Signed)
Patient has been advised. KW 

## 2015-12-26 NOTE — Telephone Encounter (Signed)
Pt is having frequency and pressure to void.  Pt is asking if she can get a Rx without coming in.  [place].  CB#[phone]/MW

## 2016-01-01 NOTE — Telephone Encounter (Signed)
error 

## 2016-03-19 ENCOUNTER — Encounter: Payer: Medicaid Other | Admitting: Obstetrics and Gynecology

## 2016-10-03 ENCOUNTER — Ambulatory Visit (INDEPENDENT_AMBULATORY_CARE_PROVIDER_SITE_OTHER): Payer: Self-pay | Admitting: Family Medicine

## 2016-10-03 ENCOUNTER — Encounter: Payer: Self-pay | Admitting: Family Medicine

## 2016-10-03 VITALS — BP 110/80 | HR 60 | Temp 98.9°F | Resp 16 | Wt 137.0 lb

## 2016-10-03 DIAGNOSIS — L659 Nonscarring hair loss, unspecified: Secondary | ICD-10-CM

## 2016-10-03 DIAGNOSIS — N941 Unspecified dyspareunia: Secondary | ICD-10-CM

## 2016-10-03 DIAGNOSIS — R350 Frequency of micturition: Secondary | ICD-10-CM

## 2016-10-03 LAB — POCT URINALYSIS DIPSTICK
Blood, UA: NEGATIVE
Glucose, UA: NEGATIVE
KETONES UA: NEGATIVE
LEUKOCYTES UA: NEGATIVE
Nitrite, UA: NEGATIVE
Spec Grav, UA: 1.02 (ref 1.010–1.025)
Urobilinogen, UA: 0.2 E.U./dL
pH, UA: 6 (ref 5.0–8.0)

## 2016-10-03 LAB — POCT URINE PREGNANCY: Preg Test, Ur: NEGATIVE

## 2016-10-03 NOTE — Progress Notes (Signed)
Subjective:     Patient ID: Michele Bailey, female   DOB: [birth year], 21 y.o.   MRN: [id number]  HPI  Chief Complaint  Patient presents with  . Urinary Retention    Patient comes in office today with complaints of today difficulty emptying bladder and pain with intercourse for the past two weeks.   Reports pain with deep penetration. She is in a monogamous relationship with the father of her daughter Halo. Also reports urinary frequency. No vaginal discharge or bleeding with intercourse.   Review of Systems  Skin:       Reports she lost some hair over the vertex of her scalp during a period of stress. It has grown back but is concerned about thyroid dysfunction which her mother has.       Objective:   Physical Exam  Constitutional: She appears well-developed and well-nourished. No distress.  Skin:  On exam of her scalp no bare areas noted.       Assessment:    1. Urinary frequency - POCT urine pregnancy - POCT urinalysis dipstick - US Pelvis Complete; Future  2. Dyspareunia in female - POCT urine pregnancy - US Transvaginal Non-OB; Future - US Pelvis Complete; Future  3. Non-scarring hair loss - CBC with Differential/Platelet - T4, free - Sedimentation rate - Renal function panel - TSH    Plan:    Further f/u pending lab and ultrasound results.

## 2016-10-03 NOTE — Patient Instructions (Addendum)
We will call you about the ultrasound scheduling and lab results.

## 2016-10-04 ENCOUNTER — Telehealth: Payer: Self-pay

## 2016-10-04 LAB — CBC WITH DIFFERENTIAL/PLATELET
BASOS ABS: 56 {cells}/uL (ref 0–200)
Basophils Relative: 0.6 %
EOS ABS: 103 {cells}/uL (ref 15–500)
Eosinophils Relative: 1.1 %
HCT: 41.1 % (ref 35.0–45.0)
HEMOGLOBIN: 14.2 g/dL (ref 11.7–15.5)
Lymphs Abs: 2247 cells/uL (ref 850–3900)
MCH: 32.3 pg (ref 27.0–33.0)
MCHC: 34.5 g/dL (ref 32.0–36.0)
MCV: 93.4 fL (ref 80.0–100.0)
MONOS PCT: 7 %
MPV: 10.1 fL (ref 7.5–12.5)
NEUTROS ABS: 6336 {cells}/uL (ref 1500–7800)
NEUTROS PCT: 67.4 %
Platelets: 342 10*3/uL (ref 140–400)
RBC: 4.4 10*6/uL (ref 3.80–5.10)
RDW: 11.9 % (ref 11.0–15.0)
Total Lymphocyte: 23.9 %
WBC mixed population: 658 cells/uL (ref 200–950)
WBC: 9.4 10*3/uL (ref 3.8–10.8)

## 2016-10-04 LAB — RENAL FUNCTION PANEL
Albumin: 4.7 g/dL (ref 3.6–5.1)
BUN: 12 mg/dL (ref 7–25)
CHLORIDE: 108 mmol/L (ref 98–110)
CO2: 25 mmol/L (ref 20–32)
Calcium: 9.7 mg/dL (ref 8.6–10.2)
Creat: 0.8 mg/dL (ref 0.50–1.10)
Glucose, Bld: 94 mg/dL (ref 65–99)
PHOSPHORUS: 4 mg/dL (ref 2.5–4.5)
Potassium: 4.1 mmol/L (ref 3.5–5.3)
Sodium: 139 mmol/L (ref 135–146)

## 2016-10-04 LAB — T4, FREE: FREE T4: 1 ng/dL (ref 0.8–1.8)

## 2016-10-04 LAB — SEDIMENTATION RATE: Sed Rate: 6 mm/h (ref 0–20)

## 2016-10-04 LAB — TSH: TSH: 2.37 mIU/L

## 2016-10-04 NOTE — Telephone Encounter (Signed)
-----   Message from Carmon Ginsberg, [place] sent at 10/04/2016  7:30 AM EDT ----- All labs normal: no anemia, sugar and thyroid ok.

## 2016-10-04 NOTE — Telephone Encounter (Signed)
Pt informed and voiced understanding of results. 

## 2016-10-04 NOTE — Telephone Encounter (Signed)
lmtcb

## 2016-10-07 ENCOUNTER — Encounter: Payer: Self-pay | Admitting: Family Medicine

## 2016-10-07 ENCOUNTER — Ambulatory Visit (INDEPENDENT_AMBULATORY_CARE_PROVIDER_SITE_OTHER): Payer: Self-pay | Admitting: Family Medicine

## 2016-10-07 VITALS — BP 110/80 | HR 111 | Temp 98.7°F | Resp 17 | Wt 138.0 lb

## 2016-10-07 DIAGNOSIS — J069 Acute upper respiratory infection, unspecified: Secondary | ICD-10-CM

## 2016-10-07 NOTE — Progress Notes (Signed)
Subjective:     Patient ID: Michele Bailey, female   DOB: 06/02/1995, 21 y.o.   MRN: [id number]  HPI  Chief Complaint  Patient presents with  . Cough    Patient comes in offie today with concerns of cough and congestion since 10/03/16. Patient reports symptoms of chills, sweats, nose bleeds and chest congestion. Patient reports that she has tried otc mucinex.   Reports cold sx.States her baby is starting to get sick as well.   Review of Systems     Objective:   Physical Exam  Constitutional: She appears well-developed and well-nourished. No distress.  Ears: T.M's intact without inflammation Sinuses: mild maxillary sinus tenderness Throat: no tonsillar enlargement or exudate Neck: bilateral anterior cervical tenderness Lungs: clear     Assessment:    1. Viral upper respiratory tract infection    Plan:    Discussed use of Mucinex D for congestion, Delsym for cough, and Benadryl for postnasal drainage

## 2016-10-07 NOTE — Patient Instructions (Signed)
Discussed use of Mucinex D for congestion, Delsym for cough, and Benadryl for postnasal drainage 

## 2016-10-09 ENCOUNTER — Ambulatory Visit: Payer: Self-pay

## 2016-10-24 ENCOUNTER — Ambulatory Visit (INDEPENDENT_AMBULATORY_CARE_PROVIDER_SITE_OTHER): Payer: Self-pay | Admitting: Family Medicine

## 2016-10-24 ENCOUNTER — Other Ambulatory Visit: Payer: Self-pay

## 2016-10-24 ENCOUNTER — Encounter: Payer: Self-pay | Admitting: Family Medicine

## 2016-10-24 VITALS — BP 110/68 | HR 70 | Temp 98.1°F | Resp 16 | Wt 141.0 lb

## 2016-10-24 DIAGNOSIS — N3090 Cystitis, unspecified without hematuria: Secondary | ICD-10-CM

## 2016-10-24 LAB — POCT URINALYSIS DIPSTICK

## 2016-10-24 MED ORDER — CEPHALEXIN 500 MG PO CAPS: 500.0000 mg | ORAL_CAPSULE | Freq: Two times a day (BID) | ORAL | 0 refills | Status: DC

## 2016-10-24 NOTE — Progress Notes (Signed)
error 

## 2016-10-24 NOTE — Progress Notes (Signed)
Subjective:     Patient ID: Michele Bailey, female   DOB: 1995-05-22, 21 y.o.   MRN: [id number]  HPI  Chief Complaint  Patient presents with  . Urinary Tract Infection    Patient comes in today c/o UTI X 2 weeks. Patient reports that she was seen in the office 2 weeks ago with similar symptoms, and feels like she never got any better. She is also having low back pain, and consistent cramping. She has been taking AZO with no relief.   Reports urinary frequency and end urination burning. No fever or chills. Has not been sexually active since her last LMP 9/25. No vaginal discharge. Did not get ultrasound for dyspareunia due to baby sitter issues. Has used a lot if AZ0 prior to visit and her u/a is unreadable.   Review of Systems     Objective:   Physical Exam  Constitutional: She appears well-developed and well-nourished. No distress.  Abdominal: Soft. There is tenderness (mild in suprabpubic area without guarding).  Genitourinary:  Genitourinary Comments: No cva tenderness        Assessment:    1. Cystitis - POCT urinalysis dipstick - Urine Culture - cephALEXin (KEFLEX) 500 MG capsule; Take 1 capsule (500 mg total) by mouth 2 (two) times daily.  Dispense: 14 capsule; Refill: 0    Plan:   Further f/u pending urine culture.

## 2016-10-24 NOTE — Patient Instructions (Signed)
We will call you with the urine culture.

## 2016-10-24 NOTE — Addendum Note (Signed)
Addended by: Quay Burow on: 10/24/2016 04:10 PM   Modules accepted: Orders

## 2016-10-25 ENCOUNTER — Other Ambulatory Visit: Payer: Self-pay

## 2016-10-25 DIAGNOSIS — N3090 Cystitis, unspecified without hematuria: Secondary | ICD-10-CM

## 2016-10-27 LAB — URINE CULTURE
MICRO NUMBER: 81140053
SPECIMEN QUALITY:: ADEQUATE

## 2016-10-28 ENCOUNTER — Telehealth: Payer: Self-pay

## 2016-10-28 NOTE — Telephone Encounter (Signed)
Pt advised.  She reports feeling some better.  She will finish her antibiotic and call if her symptoms are not completely resolved.    Thanks,   -Mickel Baas

## 2016-10-28 NOTE — Telephone Encounter (Signed)
-----   Message from Carmon Ginsberg, [place] sent at 10/28/2016  7:31 AM EDT ----- Urine culture with E. Coli infection sensitive to cephalexin. Is she feeling better?

## 2016-11-05 ENCOUNTER — Ambulatory Visit (INDEPENDENT_AMBULATORY_CARE_PROVIDER_SITE_OTHER): Payer: Self-pay | Admitting: Family Medicine

## 2016-11-05 ENCOUNTER — Encounter: Payer: Self-pay | Admitting: Family Medicine

## 2016-11-05 ENCOUNTER — Other Ambulatory Visit: Payer: Self-pay | Admitting: Family Medicine

## 2016-11-05 VITALS — BP 116/78 | HR 68 | Temp 97.8°F | Resp 16 | Wt 133.2 lb

## 2016-11-05 DIAGNOSIS — N760 Acute vaginitis: Secondary | ICD-10-CM

## 2016-11-05 DIAGNOSIS — N3000 Acute cystitis without hematuria: Secondary | ICD-10-CM

## 2016-11-05 DIAGNOSIS — F4322 Adjustment disorder with anxiety: Secondary | ICD-10-CM

## 2016-11-05 LAB — POCT URINALYSIS DIPSTICK
Blood, UA: NEGATIVE
Glucose, UA: NEGATIVE
NITRITE UA: NEGATIVE
PROTEIN UA: 30
Spec Grav, UA: 1.025 (ref 1.010–1.025)
UROBILINOGEN UA: 2 U/dL — AB
pH, UA: 5 (ref 5.0–8.0)

## 2016-11-05 MED ORDER — FLUCONAZOLE 150 MG PO TABS: 150.0000 mg | ORAL_TABLET | Freq: Once | ORAL | 1 refills | Status: AC

## 2016-11-05 MED ORDER — CLONAZEPAM 0.5 MG PO TABS: 0.5000 mg | ORAL_TABLET | Freq: Two times a day (BID) | ORAL | 0 refills | Status: DC | PRN

## 2016-11-05 NOTE — Progress Notes (Signed)
Subjective:     Patient ID: Michele Bailey, female   DOB: October 22, 1995, 21 y.o.   MRN: [id number]  HPI   Review of Systems     Objective:   Physical Exam     Assessment:        Plan:

## 2016-11-05 NOTE — Patient Instructions (Signed)
We will call you with the lab results. 
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
## 2016-11-06 ENCOUNTER — Inpatient Hospital Stay
Admission: AD | Admit: 2016-11-06 | Discharge: 2016-11-07 | DRG: 882 | Disposition: A | Discharge status: Home / Self Care | Payer: No Typology Code available for payment source | Attending: Psychiatry | Admitting: Psychiatry

## 2016-11-06 ENCOUNTER — Emergency Department: Payer: Self-pay

## 2016-11-06 ENCOUNTER — Other Ambulatory Visit: Payer: Self-pay | Admitting: Family Medicine

## 2016-11-06 ENCOUNTER — Emergency Department
Admission: EM | Admit: 2016-11-06 | Discharge: 2016-11-06 | Disposition: A | Discharge status: Other Acute Inpatient Hospital | Payer: Self-pay | Attending: Emergency Medicine | Admitting: Emergency Medicine

## 2016-11-06 DIAGNOSIS — F17200 Nicotine dependence, unspecified, uncomplicated: Secondary | ICD-10-CM | POA: Insufficient documentation

## 2016-11-06 DIAGNOSIS — K219 Gastro-esophageal reflux disease without esophagitis: Secondary | ICD-10-CM | POA: Diagnosis present

## 2016-11-06 DIAGNOSIS — T424X2A Poisoning by benzodiazepines, intentional self-harm, initial encounter: Secondary | ICD-10-CM | POA: Insufficient documentation

## 2016-11-06 DIAGNOSIS — F419 Anxiety disorder, unspecified: Secondary | ICD-10-CM | POA: Diagnosis present

## 2016-11-06 DIAGNOSIS — B009 Herpesviral infection, unspecified: Secondary | ICD-10-CM | POA: Insufficient documentation

## 2016-11-06 DIAGNOSIS — T50902A Poisoning by unspecified drugs, medicaments and biological substances, intentional self-harm, initial encounter: Secondary | ICD-10-CM

## 2016-11-06 DIAGNOSIS — F329 Major depressive disorder, single episode, unspecified: Secondary | ICD-10-CM | POA: Diagnosis present

## 2016-11-06 DIAGNOSIS — G479 Sleep disorder, unspecified: Secondary | ICD-10-CM | POA: Diagnosis present

## 2016-11-06 DIAGNOSIS — F17210 Nicotine dependence, cigarettes, uncomplicated: Secondary | ICD-10-CM | POA: Diagnosis present

## 2016-11-06 DIAGNOSIS — T424X1A Poisoning by benzodiazepines, accidental (unintentional), initial encounter: Secondary | ICD-10-CM

## 2016-11-06 DIAGNOSIS — F4325 Adjustment disorder with mixed disturbance of emotions and conduct: Principal | ICD-10-CM | POA: Diagnosis present

## 2016-11-06 LAB — CBC WITH DIFFERENTIAL/PLATELET
BASOS PCT: 1 %
Basophils Absolute: 0.1 10*3/uL (ref 0–0.1)
Eosinophils Absolute: 0.2 10*3/uL (ref 0–0.7)
Eosinophils Relative: 3 %
HEMATOCRIT: 40.2 % (ref 35.0–47.0)
HEMOGLOBIN: 13.9 g/dL (ref 12.0–16.0)
LYMPHS PCT: 38 %
Lymphs Abs: 2.8 10*3/uL (ref 1.0–3.6)
MCH: 32.8 pg (ref 26.0–34.0)
MCHC: 34.5 g/dL (ref 32.0–36.0)
MCV: 95.2 fL (ref 80.0–100.0)
MONO ABS: 0.5 10*3/uL (ref 0.2–0.9)
MONOS PCT: 6 %
NEUTROS ABS: 3.9 10*3/uL (ref 1.4–6.5)
NEUTROS PCT: 52 %
Platelets: 258 10*3/uL (ref 150–440)
RBC: 4.22 MIL/uL (ref 3.80–5.20)
RDW: 12.6 % (ref 11.5–14.5)
WBC: 7.5 10*3/uL (ref 3.6–11.0)

## 2016-11-06 LAB — URINALYSIS, COMPLETE (UACMP) WITH MICROSCOPIC
BILIRUBIN URINE: NEGATIVE
Glucose, UA: NEGATIVE mg/dL
Hgb urine dipstick: NEGATIVE
KETONES UR: NEGATIVE mg/dL
Nitrite: NEGATIVE
Protein, ur: NEGATIVE mg/dL
Specific Gravity, Urine: 1.005 (ref 1.005–1.030)
pH: 6 (ref 5.0–8.0)

## 2016-11-06 LAB — URINE DRUG SCREEN, QUALITATIVE (ARMC ONLY)
AMPHETAMINES, UR SCREEN: NOT DETECTED
Barbiturates, Ur Screen: NOT DETECTED
Benzodiazepine, Ur Scrn: POSITIVE — AB
Cannabinoid 50 Ng, Ur ~~LOC~~: POSITIVE — AB
Cocaine Metabolite,Ur ~~LOC~~: NOT DETECTED
MDMA (Ecstasy)Ur Screen: NOT DETECTED
Methadone Scn, Ur: NOT DETECTED
Opiate, Ur Screen: NOT DETECTED
PHENCYCLIDINE (PCP) UR S: NOT DETECTED
Tricyclic, Ur Screen: POSITIVE — AB

## 2016-11-06 LAB — COMPREHENSIVE METABOLIC PANEL
ALBUMIN: 4.4 g/dL (ref 3.5–5.0)
ALK PHOS: 59 U/L (ref 38–126)
ALT: 14 U/L (ref 14–54)
ANION GAP: 10 (ref 5–15)
AST: 21 U/L (ref 15–41)
BILIRUBIN TOTAL: 0.7 mg/dL (ref 0.3–1.2)
BUN: 8 mg/dL (ref 6–20)
CO2: 24 mmol/L (ref 22–32)
Calcium: 9.4 mg/dL (ref 8.9–10.3)
Chloride: 106 mmol/L (ref 101–111)
Creatinine, Ser: 0.83 mg/dL (ref 0.44–1.00)
GFR calc Af Amer: >60 mL/min (ref >60)
GLUCOSE: 75 mg/dL (ref 65–99)
Potassium: 3.1 mmol/L — ABNORMAL LOW (ref 3.5–5.1)
Sodium: 140 mmol/L (ref 135–145)
Total Protein: 7.7 g/dL (ref 6.5–8.1)

## 2016-11-06 LAB — TEST AUTHORIZATION

## 2016-11-06 LAB — GC PROBE AMP THINPREP: N. gonorrhoeae RNA, TMA: NOT DETECTED

## 2016-11-06 LAB — PREGNANCY, URINE: Preg Test, Ur: NEGATIVE

## 2016-11-06 LAB — CHLAMYDIA PROBE AMP THINPREP: C. TRACHOMATIS RNA, TMA: NOT DETECTED

## 2016-11-06 LAB — ETHANOL: Alcohol, Ethyl (B): <10 mg/dL (ref <10)

## 2016-11-06 LAB — HCG, QUANTITATIVE, PREGNANCY

## 2016-11-06 MED ORDER — FLUMAZENIL 0.5 MG/5ML IV SOLN
0.2000 mg | Freq: Once | INTRAVENOUS | Status: AC
  Administered 2016-11-06: 0.2 mg via INTRAVENOUS
  Filled 2016-11-06: qty 5

## 2016-11-06 MED ORDER — IBUPROFEN 600 MG PO TABS
600.0000 mg | ORAL_TABLET | Freq: Four times a day (QID) | ORAL | Status: DC | PRN
  Administered 2016-11-06: 600 mg via ORAL
  Filled 2016-11-06: qty 1

## 2016-11-06 MED ORDER — ACETAMINOPHEN 325 MG PO TABS: 650.0000 mg | ORAL_TABLET | Freq: Four times a day (QID) | ORAL | Status: DC | PRN

## 2016-11-06 MED ORDER — NORETHIN ACE-ETH ESTRAD-FE 1-20 MG-MCG PO TABS: 1.0000 | ORAL_TABLET | Freq: Every day | ORAL | Status: DC

## 2016-11-06 MED ORDER — HYDROXYZINE HCL 25 MG PO TABS: 25.0000 mg | ORAL_TABLET | Freq: Three times a day (TID) | ORAL | Status: DC | PRN

## 2016-11-06 MED ORDER — TRAZODONE HCL 50 MG PO TABS: 50.0000 mg | ORAL_TABLET | Freq: Every evening | ORAL | Status: DC | PRN

## 2016-11-06 MED ORDER — MAGNESIUM HYDROXIDE 400 MG/5ML PO SUSP: 30.0000 mL | Freq: Every day | ORAL | Status: DC | PRN

## 2016-11-06 MED ORDER — ALUM & MAG HYDROXIDE-SIMETH 200-200-20 MG/5ML PO SUSP: 30.0000 mL | ORAL | Status: DC | PRN
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
## 2016-11-06 NOTE — ED Notes (Signed)
Pt is awake Mother at bedside for visit. Pt asking when she can go home using profanity and raising her voice. Stating she was not trying to kill her self. EDP brought to bedside pt and mother made aware of pt IVC status, and awaiting psy consult. Pt dressed out in paper scrub by this RN moved to ED Quad per EDP. Amy RN aware of pt.
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
## 2016-11-06 NOTE — ED Notes (Signed)
Received report from [place], patient transferred to room 6 in [place].

## 2016-11-06 NOTE — BH Assessment (Signed)
Patient is to be admitted to [facility] by Dr. Weber Cooks.  Attending Physician will be Dr. Wonda Olds.   Patient has been assigned to room 302, by [place] Nurse [facility] ER staff is aware of the admission

## 2016-11-06 NOTE — ED Notes (Signed)
Pt resting in bed, arousable to verbal stimuli, resp even and unlabored

## 2016-11-06 NOTE — ED Notes (Signed)
[place] [employer] at bedside 

## 2016-11-06 NOTE — ED Provider Notes (Signed)
-----------------------------------------   3:38 PM on 11/06/2016 -----------------------------------------  The patient has been seen by psychiatry.  They will be admitting to their service for further treatment once a bed becomes available.   Harvest Dark, MD 11/06/16 1538

## 2016-11-06 NOTE — ED Notes (Signed)
This Rn received call from pt mother she stated. She inquired about when she could see the pt and if she could talk to her on the phone. Pt is not awake enough to carry on a conversation on the phone. RN told mother visitation was 12-3 for 10 mins. Mother acknowledge a understanding. Rn will continue to monitor

## 2016-11-06 NOTE — ED Notes (Signed)
Patient transported to CT 

## 2016-11-06 NOTE — ED Notes (Signed)
Patient had complained to back pain and nurse called MD and received order for motrin, Patient is upset having to be admitted, but is cooperative and calm at this time, denies Si/hi or avh. q 15 minute checks and camera surveillance in progress for safety.

## 2016-11-06 NOTE — ED Notes (Signed)
This Rn called for food tray for pt.
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
## 2016-11-06 NOTE — BH Assessment (Signed)
Per RN pt lethargic and unable to participate in TTS Consult at this time. Pt to be assessed at later time.

## 2016-11-06 NOTE — ED Notes (Signed)
ED Is the patient under IVC or is there intent for IVC: Yes.   Is the patient medically cleared: Yes.   Is there vacancy in the ED BHU: Yes.   Is the population mix appropriate for patient: Yes.   Is the patient awaiting placement in inpatient or outpatient setting:   Has the patient had a psychiatric consult:  Consult pending Survey of unit performed for contraband, proper placement and condition of furniture, tampering with fixtures in bathroom, shower, and each patient room: Yes.  ; Findings:  APPEARANCE/BEHAVIOR Calm and cooperative NEURO ASSESSMENT Orientation: oriented x4 Denies pain Hallucinations: No.None noted (Hallucinations) Speech: Normal Gait: normal RESPIRATORY ASSESSMENT Even  Unlabored respirations  CARDIOVASCULAR ASSESSMENT Pulses equal   regular rate  Skin warm and dry   GASTROINTESTINAL ASSESSMENT no GI complaint EXTREMITIES Full ROM  PLAN OF CARE Provide calm/safe environment. Vital signs assessed twice daily. ED BHU Assessment once each 12-hour shift. Collaborate with TTS when available or as condition indicates. Assure the ED provider has rounded once each shift. Provide and encourage hygiene. Provide redirection as needed. Assess for escalating behavior; address immediately and inform ED provider.  Assess family dynamic and appropriateness for visitation as needed: Yes.  ; If necessary, describe findings:  Educate the patient/family about BHU procedures/visitation: Yes.  ; If necessary, describe findings:

## 2016-11-06 NOTE — ED Notes (Signed)
Pt states that she took 6-10 klonopin to make her brain quite working. Pt is drowsy but arousal. Pt states she is in no pain at this time

## 2016-11-06 NOTE — ED Notes (Signed)
BEHAVIORAL HEALTH ROUNDING Patient sleeping: No. Patient alert and oriented: yes Behavior appropriate: Yes.  ; If no, describe:  Nutrition and fluids offered: yes Toileting and hygiene offered: Yes  Sitter present: q15 minute observations and security  monitoring Law enforcement present: Yes  ODS  

## 2016-11-06 NOTE — ED Notes (Signed)
Upon assessment pt was very lethargic. Pt was not arousal to verbal command, but arousal to sternum rub. RN notified EDP of pt status, EDP brought to bedside for eval medication ordered and given pt soon after sat up in bed able to look around, and answer questions.

## 2016-11-06 NOTE — ED Notes (Signed)
pts pink and silver colored belly button ring removed, pts 1 pink earring and 2 white colored earrings removed and placed in pts belongings cup

## 2016-11-06 NOTE — ED Notes (Signed)
Pt awake

## 2016-11-06 NOTE — ED Notes (Signed)
Preparing pt for transfer to BMU.  
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
## 2016-11-07 DIAGNOSIS — F4325 Adjustment disorder with mixed disturbance of emotions and conduct: Principal | ICD-10-CM

## 2016-11-07 LAB — TSH: TSH: 0.715 u[IU]/mL (ref 0.350–4.500)
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
## 2016-11-07 NOTE — Progress Notes (Signed)
Patient ID: JAKYRA KENEALY, female   DOB: 03-Jul-1995, 21 y.o.   MRN: [id number]   Received Michaella this am in the dining room socializing with her peers. She denied all psychiatric symptoms and does not feel suicidal or homicidal. She verbalized feeling safe for discharge home today and looking forward to her mom arriving to take her home. She is continuing to socialize with her peers and attending the group therapy sessions. She received her discharge order, the AVS reviewed and questions answered. Her personal belongings were returned. She was discharged with her mom without incident.

## 2016-11-07 NOTE — Tx Team (Signed)
Initial Treatment Plan 11/07/2016 12:54 AM Michele Bailey PQD:[id number]    PATIENT STRESSORS: Financial difficulties Substance abuse   PATIENT STRENGTHS: Average or above average intelligence Motivation for treatment/growth   PATIENT IDENTIFIED PROBLEMS: Altered mental state    Benzo. Over dose    Herpes simplex virus infection             DISCHARGE CRITERIA:  Adequate post-discharge living arrangements Motivation to continue treatment in a less acute level of care  PRELIMINARY DISCHARGE PLAN: Attend aftercare/continuing care group Return to previous living arrangement  PATIENT/FAMILY INVOLVEMENT: This treatment plan has been presented to and reviewed with the patient, Michele Bailey, .  The patient  have been given the opportunity to ask questions and make suggestions.  Clemens Catholic, RN 11/07/2016, 12:54 AM
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
## 2016-11-07 NOTE — Progress Notes (Signed)
  [facility] Adult Case Management Discharge Plan :  Will you be returning to the same living situation after discharge:  Yes,  with mother At discharge, do you have transportation home?: Yes,  mother Do you have the ability to pay for your medications: No. Medication management referral made.  Release of information consent forms completed and in the chart;  Patient's signature needed at discharge.  Patient to Follow up at: Follow-up Information    [place] on 11/08/2016.   Why:  Please meet Sherrian Divers, Peer Support Services, on Friday, 11/08/16, at 7:15am for your follow up appointment.  Please bring a copy of your hospital discharge paperwork. Contact information: [address] [place] [address] [phone]           Next level of care provider has access to [place] and Suicide Prevention discussed: Yes,  with mother  Have you used any form of tobacco in the last 30 days? (Cigarettes, Smokeless Tobacco, Cigars, and/or Pipes): Yes  Has patient been referred to the Quitline?: Patient refused referral  Patient has been referred for addiction treatment: Yes   [facility]/Dometic Violence contact information also provided to pt, who denies any domestic violence concerns.  Joanne Chars, LCSW 11/07/2016, 11:30 AM

## 2016-11-07 NOTE — BHH Group Notes (Signed)
[place] Group Notes:  (Nursing/MHT/Case Management/Adjunct)  Date:  11/07/2016  Time:  1:59 PM  Type of Therapy:  Psychoeducational Skills  Participation Level:  Active  Participation Quality:  Appropriate  Affect:  Appropriate  Cognitive:  Appropriate  Insight:  Appropriate  Engagement in Group:  Engaged  Modes of Intervention:  Discussion, Education, Socialization and Support  Summary of Progress/Problems:  Adela Lank Eragon Hammond 11/07/2016, 1:59 PM

## 2016-11-07 NOTE — BHH Suicide Risk Assessment (Signed)
[facility] Admission Suicide Risk Assessment   Nursing information obtained from:  Patient Demographic factors:  NA Current Mental Status:  Self-harm behaviors Loss Factors:  NA Historical Factors:  NA Risk Reduction Factors:  Sense of responsibility to family  Total Time spent with patient: 45 minutes Principal Problem: Adjustment disorder with mixed disturbance of emotions and conduct Diagnosis:   Patient Active Problem List   Diagnosis Date Noted  . Adjustment disorder with mixed disturbance of emotions and conduct [F43.25] 11/06/2016  . Benzodiazepine overdose [T42.4X1A] 11/06/2016  . 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   Continued Clinical Symptoms:  Alcohol Use Disorder Identification Test Final Score (AUDIT): 6 The "Alcohol Use Disorders Identification Test", Guidelines for Use in Primary Care, Second Edition.  World Pharmacologist [facility]). Score between 0-7:  no or low risk or alcohol related problems. Score between 8-15:  moderate risk of alcohol related problems. Score between 16-19:  high risk of alcohol related problems. Score 20 or above:  warrants further diagnostic evaluation for alcohol dependence and treatment.   CLINICAL FACTORS:   Depression:   Insomnia   Musculoskeletal: Strength & Muscle Tone: within normal limits Gait & Station: normal Patient leans: N/A  Psychiatric Specialty Exam: Physical Exam  ROS  Blood pressure 99/71, pulse 61, temperature 98.3 F (36.8 C), temperature source Oral, resp. rate 18, height 5\' 4"  (1.626 m), weight 57.6 kg (127 lb), SpO2 100 %, not currently breastfeeding.Body mass index is 21.8 kg/m.    General Appearance: Disheveled  Eye Contact:  Fair  Speech:  Clear and Coherent  Volume:  Normal  Mood:  Depressed  Affect:  Constricted  Thought Process:  Goal Directed  Orientation:   Full (Time, Place, and Person)  Thought Content:  Negative  Suicidal Thoughts:  No  Homicidal Thoughts:  No  Memory:  Immediate;   Fair  Judgement:  Fair  Insight:  Fair  Psychomotor Activity:  Normal  Concentration:  Concentration: Fair  Recall:  [employer] of Knowledge:  Fair  Language:  Fair  Akathisia:  No      Assets:  Communication Skills Resilience  ADL's:  Intact  Cognition:  WNL  Sleep:  Number of Hours: 4     COGNITIVE FEATURES THAT CONTRIBUTE TO RISK:  None    SUICIDE RISK:   Minimal: No identifiable suicidal ideation.  Patients presenting with no risk factors but with morbid ruminations; may be classified as minimal risk based on the severity of the depressive symptoms  PLAN OF CARE:  -discharge home today with her mother  I certify that inpatient services furnished can reasonably be expected to improve the patient's condition.   Marylin Crosby, MD 11/07/2016, 10:50 AM

## 2016-11-07 NOTE — BHH Counselor (Signed)
No assessment completed as pt was discharge in less than 24 hours. Winferd Humphrey, MSW, LCSW Clinical Social Worker 11/07/2016 11:19 AM

## 2016-11-07 NOTE — BHH Group Notes (Signed)
[place] LCSW Group Therapy Note  Date/Time: 11/07/16, 0930  Type of Therapy/Topic:  Group Therapy:  Balance in Life  Participation Level:  Did not attend  Description of Group:    This group will address the concept of balance and how it feels and looks when one is unbalanced. Patients will be encouraged to process areas in their lives that are out of balance, and identify reasons for remaining unbalanced. Facilitators will guide patients utilizing problem- solving interventions to address and correct the stressor making their life unbalanced. Understanding and applying boundaries will be explored and addressed for obtaining  and maintaining a balanced life. Patients will be encouraged to explore ways to assertively make their unbalanced needs known to significant others in their lives, using other group members and facilitator for support and feedback.  Therapeutic Goals: 1. Patient will identify two or more emotions or situations they have that consume much of in their lives. 2. Patient will identify signs/triggers that life has become out of balance:  3. Patient will identify two ways to set boundaries in order to achieve balance in their lives:  4. Patient will demonstrate ability to communicate their needs through discussion and/or role plays  Summary of Patient Progress:          Therapeutic Modalities:   Cognitive Behavioral Therapy Solution-Focused Therapy Assertiveness Training  Lurline Idol, LCSW
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
## 2016-11-08 ENCOUNTER — Other Ambulatory Visit: Payer: Self-pay | Admitting: Family Medicine

## 2016-11-08 DIAGNOSIS — N3090 Cystitis, unspecified without hematuria: Secondary | ICD-10-CM

## 2016-11-08 LAB — URINE CULTURE
MICRO NUMBER: 81185661
SPECIMEN QUALITY:: ADEQUATE

## 2016-11-08 MED ORDER — CIPROFLOXACIN HCL 250 MG PO TABS
250.0000 mg | ORAL_TABLET | Freq: Two times a day (BID) | ORAL | 0 refills | Status: DC
Start: 2016-11-08 — End: 2016-11-11

## 2016-11-11 ENCOUNTER — Telehealth: Payer: Self-pay | Admitting: Family Medicine

## 2016-11-11 ENCOUNTER — Other Ambulatory Visit: Payer: Self-pay

## 2016-11-11 ENCOUNTER — Other Ambulatory Visit: Payer: Self-pay | Admitting: Family Medicine

## 2016-11-11 DIAGNOSIS — N3090 Cystitis, unspecified without hematuria: Secondary | ICD-10-CM

## 2016-11-11 MED ORDER — CIPROFLOXACIN HCL 250 MG PO TABS: 250.0000 mg | ORAL_TABLET | Freq: Two times a day (BID) | ORAL | 0 refills | Status: DC

## 2016-11-11 NOTE — Telephone Encounter (Signed)
Pt reports [facility] only gave her one tablet. Resent medication to pharmacy. Pt agrees to call back if sx do not improve after finishing medication.

## 2016-11-11 NOTE — Telephone Encounter (Signed)
Pt states she continues having a UTI.  Pt is still having pain when voiding and frequency.  Pt is requesting a referral to see a specialist.  CB#[phone]/MW

## 2016-11-11 NOTE — Telephone Encounter (Signed)
Please review

## 2016-11-11 NOTE — Telephone Encounter (Signed)
Please see if she started the Cipro I called in 10/26

## 2016-11-12 ENCOUNTER — Telehealth: Payer: Self-pay

## 2016-11-12 NOTE — Telephone Encounter (Signed)
-----   Message from Carmon Ginsberg, [place] sent at 11/06/2016 10:57 AM EDT ----- No GC/chlamydia detected: wait until she is released from the ER to notify.

## 2016-11-12 NOTE — Telephone Encounter (Signed)
[id number] 

## 2016-11-14 NOTE — Telephone Encounter (Signed)
Patient advised.KW 

## 2016-11-14 NOTE — Telephone Encounter (Signed)
[id number] 

## 2016-11-15 ENCOUNTER — Telehealth: Payer: Self-pay | Admitting: Family Medicine

## 2016-11-15 NOTE — Telephone Encounter (Signed)
FYI--Pt did not get pelvic ultrasound done that was ordered 10/03/16

## 2016-11-15 NOTE — Telephone Encounter (Signed)
fyi-Anastasiya V Hopkins, RMA

## 2016-11-25 ENCOUNTER — Telehealth: Payer: Self-pay

## 2016-11-25 ENCOUNTER — Other Ambulatory Visit: Payer: Self-pay | Admitting: Family Medicine

## 2016-11-25 DIAGNOSIS — R300 Dysuria: Secondary | ICD-10-CM

## 2016-11-25 NOTE — Telephone Encounter (Signed)
Patient notified. KW °

## 2016-11-25 NOTE — Telephone Encounter (Signed)
Patient called office requesting referral to Urology for evaluation. Patient reports that she is still having pressure when voiding. KW

## 2016-11-25 NOTE — Telephone Encounter (Signed)
Urology referral in progress ° °

## 2016-12-01 NOTE — Progress Notes (Deleted)
12/02/2016 9:18 PM   Judson Roch Dowdy Nov 21, 1995 [id number]  Referring provider: Carmon Ginsberg, [place] [address] [place], [place] [address]  No chief complaint on file.   HPI: Patient is a 21 year old Caucasian female who is referred to [place] by, Carmon Ginsberg, PA for recurrent urinary tract infections.  Patient states that she has had *** urinary tract infections over the last year.  Reviewing her records,  she has had two documented UTI's over the last month.   .    Her symptoms with a urinary tract infection consist of ***.  She denies/endorses dysuria, gross hematuria, suprapubic pain, back pain, abdominal pain or flank pain.***  She has not had any recent fevers, chills, nausea or vomiting. ***  She does/does not have a history of nephrolithiasis, GU surgery or GU trauma. ***  She is/is not sexually active.  She has/has not noted a correlation with her urinary tract infections and sexual intercourse.  ***   She does/does not engage in anal sex. ***  She is/ is not having anal to vaginal sex.*** She is/is not voiding before and after sex. ***     She is/is not postmenopausal. ***  She admits to/denies constipation and/or diarrhea. ***  She does/does not use tampons.  She does/does not engage in good perineal hygiene. She does/does not take tub baths. ***  She has/does not have incontinence.  She is using incontinence pads. ***  She is having/ not having pain with bladder filling.  ***  She has/not had any recent imaging studies.  ***  She is drinking *** of water daily.       Reviewed referral notes.    PMH: Past Medical History:  Diagnosis Date  . Acid reflux 02/04/2015  . 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 NO PAST SURGERIES      Home Medications:  Allergies as of 12/02/2016      Reactions   Augmentin [amoxicillin-pot Clavulanate] Nausea Only   Willing  to take med if necessary with nausea med      Medication List        Accurate as of 12/01/16  9:18 PM. Always use your most recent med list.          ciprofloxacin 250 MG tablet Commonly known as:  CIPRO Take 1 tablet (250 mg total) by mouth 2 (two) times daily.   norethindrone-ethinyl estradiol 1-20 MG-MCG tablet Commonly known as:  JUNEL FE 1/20 Take 1 tablet by mouth daily.       Allergies:  Allergies  Allergen Reactions  . Augmentin [Amoxicillin-Pot Clavulanate] Nausea Only    Willing to take med if necessary with nausea med    Family History: Family History  Problem Relation Age of Onset  . Rheum arthritis Mother   . Migraines Mother   . Thyroid disease Mother        hypothyroid  . Hyperlipidemia Father   . Hypertension Father   . Rheum arthritis Maternal Grandfather   . Ovarian cancer Other        mother's great aunt  . Cancer - Other Unknown        fob-father died at age 15y from a type of ca that he was born with    Social History:  reports that she has been smoking.  She has been smoking about 0.25 packs per day. she has never used smokeless tobacco. She reports  that she does not drink alcohol or use drugs.  ROS:                                        Physical Exam: There were no vitals taken for this visit.  Constitutional: Well nourished. Alert and oriented, No acute distress. HEENT: [place] AT, moist mucus membranes. Trachea midline, no masses. Cardiovascular: No clubbing, cyanosis, or edema. Respiratory: Normal respiratory effort, no increased work of breathing. GI: Abdomen is soft, non tender, non distended, no abdominal masses. Liver and spleen not palpable.  No hernias appreciated.  Stool sample for occult testing is not indicated.   GU: No CVA tenderness.  No bladder fullness or masses.  Normal external genitalia, normal pubic hair distribution, no lesions.  Normal urethral meatus, no lesions, no prolapse, no discharge.    No urethral masses, tenderness and/or tenderness. No bladder fullness, tenderness or masses. Normal vagina mucosa, good estrogen effect, no discharge, no lesions, good pelvic support, no cystocele or rectocele noted.  No cervical motion tenderness.  Uterus is freely mobile and non-fixed.  No adnexal/parametria masses or tenderness noted.  Anus and perineum are without rashes or lesions.   *** Skin: No rashes, bruises or suspicious lesions. Lymph: No cervical or inguinal adenopathy. Neurologic: Grossly intact, no focal deficits, moving all 4 extremities. Psychiatric: Normal mood and affect.  Laboratory Data: Lab Results  Component Value Date   WBC 7.5 11/06/2016   HGB 13.9 11/06/2016   HCT 40.2 11/06/2016   MCV 95.2 11/06/2016   PLT 258 11/06/2016    Lab Results  Component Value Date   CREATININE 0.83 11/06/2016    Lab Results  Component Value Date   TSH 0.715 11/07/2016    Lab Results  Component Value Date   AST 21 11/06/2016   Lab Results  Component Value Date   ALT 14 11/06/2016    Urinalysis    Component Value Date/Time   COLORURINE YELLOW (A) 11/06/2016 0933   APPEARANCEUR CLEAR (A) 11/06/2016 0933   APPEARANCEUR Cloudy (A) 03/30/2015 1600   LABSPEC 1.005 11/06/2016 0933   LABSPEC 1.013 01/30/2013 1456   PHURINE 6.0 11/06/2016 0933   GLUCOSEU NEGATIVE 11/06/2016 0933   GLUCOSEU Negative 01/30/2013 1456   HGBUR NEGATIVE 11/06/2016 0933   BILIRUBINUR NEGATIVE 11/06/2016 0933   BILIRUBINUR small 11/05/2016 1234   BILIRUBINUR Negative 03/30/2015 1600   BILIRUBINUR Negative 01/30/2013 1456   KETONESUR NEGATIVE 11/06/2016 0933   PROTEINUR NEGATIVE 11/06/2016 0933   UROBILINOGEN 2.0 (A) 11/05/2016 1234   NITRITE NEGATIVE 11/06/2016 0933   LEUKOCYTESUR TRACE (A) 11/06/2016 0933   LEUKOCYTESUR Negative 03/30/2015 1600   LEUKOCYTESUR 3+ 01/30/2013 1456    I have reviewed the labs.   Pertinent Imaging: *** I have independently reviewed the films.     Assessment & Plan:  ***  1. Recurrent UTI's  - criteria for recurrent UTI has been met with 2 or more infections in 6 months or 3 or greater infections in one year ***  - Patient is instructed to increase their water intake until the urine is pale yellow or clear (10 to 12 cups daily) ***  - probiotics (yogurt, oral pills or vaginal suppositories), take cranberry pills or drink the juice and Vitamin C 1,000 mg daily to acidify the urine should be added to their daily regimen ***  -. if using tampons, she should remove them prior to urinating  and change them often ***  -avoid soaking in tubs and wipe front to back after urinating ***  - benefit from core strengthening exercises has been seen.  We can refer her to PT if they desire ***  - advised them to have CATH UA's for urinalysis and culture to prevent skin contamination of the specimen  - reviewed symptoms of UTI and advised not to have urine checked or be treated for UTI if not experiencing symptoms  - discussed antibiotic stewardship with the patient                                                   No Follow-up on file.  These notes generated with voice recognition software. I apologize for typographical errors.  Zara Council, [facility] [address], [place],  [phone]

## 2016-12-02 ENCOUNTER — Ambulatory Visit: Payer: Self-pay | Admitting: Urology

## 2016-12-02 ENCOUNTER — Encounter: Payer: Self-pay | Admitting: Urology

## 2017-01-31 ENCOUNTER — Emergency Department: Payer: Self-pay

## 2017-01-31 ENCOUNTER — Emergency Department
Admission: EM | Admit: 2017-01-31 | Discharge: 2017-01-31 | Disposition: A | Discharge status: Home / Self Care | Payer: Self-pay | Attending: Emergency Medicine | Admitting: Emergency Medicine

## 2017-01-31 ENCOUNTER — Other Ambulatory Visit: Payer: Self-pay

## 2017-01-31 ENCOUNTER — Encounter: Payer: Self-pay | Admitting: *Deleted

## 2017-01-31 DIAGNOSIS — Z3A08 8 weeks gestation of pregnancy: Secondary | ICD-10-CM | POA: Insufficient documentation

## 2017-01-31 DIAGNOSIS — O039 Complete or unspecified spontaneous abortion without complication: Secondary | ICD-10-CM | POA: Insufficient documentation

## 2017-01-31 DIAGNOSIS — F17210 Nicotine dependence, cigarettes, uncomplicated: Secondary | ICD-10-CM | POA: Insufficient documentation

## 2017-01-31 LAB — BASIC METABOLIC PANEL
Anion gap: 9 (ref 5–15)
BUN: 11 mg/dL (ref 6–20)
CHLORIDE: 104 mmol/L (ref 101–111)
CO2: 24 mmol/L (ref 22–32)
Calcium: 9 mg/dL (ref 8.9–10.3)
Creatinine, Ser: 0.69 mg/dL (ref 0.44–1.00)
GFR calc Af Amer: >60 mL/min (ref >60)
GFR calc non Af Amer: >60 mL/min (ref >60)
Glucose, Bld: 85 mg/dL (ref 65–99)
POTASSIUM: 3.5 mmol/L (ref 3.5–5.1)
SODIUM: 137 mmol/L (ref 135–145)

## 2017-01-31 LAB — URINALYSIS, COMPLETE (UACMP) WITH MICROSCOPIC
BILIRUBIN URINE: NEGATIVE
Bacteria, UA: NONE SEEN
GLUCOSE, UA: NEGATIVE mg/dL
KETONES UR: NEGATIVE mg/dL
NITRITE: NEGATIVE
PROTEIN: NEGATIVE mg/dL
Specific Gravity, Urine: 1.018 (ref 1.005–1.030)
pH: 6 (ref 5.0–8.0)

## 2017-01-31 LAB — CBC
HCT: 39.8 % (ref 35.0–47.0)
HEMOGLOBIN: 13.6 g/dL (ref 12.0–16.0)
MCH: 33.3 pg (ref 26.0–34.0)
MCHC: 34.2 g/dL (ref 32.0–36.0)
MCV: 97.3 fL (ref 80.0–100.0)
Platelets: 300 10*3/uL (ref 150–440)
RBC: 4.09 MIL/uL (ref 3.80–5.20)
RDW: 13 % (ref 11.5–14.5)
WBC: 9.3 10*3/uL (ref 3.6–11.0)

## 2017-01-31 LAB — HCG, QUANTITATIVE, PREGNANCY: hCG, Beta Chain, Quant, S: 3789 m[IU]/mL — ABNORMAL HIGH (ref <5)

## 2017-01-31 LAB — POC URINE PREG, ED: Preg Test, Ur: POSITIVE — AB

## 2017-01-31 MED ORDER — IBUPROFEN 800 MG PO TABS: 800.0000 mg | ORAL_TABLET | Freq: Three times a day (TID) | ORAL | 0 refills | Status: DC | PRN

## 2017-01-31 NOTE — ED Provider Notes (Signed)
[facility] Emergency Department Provider Note       Time seen: ----------------------------------------- 5:21 PM on 01/31/2017 -----------------------------------------   I have reviewed the triage vital signs and the nursing notes.  HISTORY   Chief Complaint Vaginal Bleeding    HPI Michele Bailey is a 22 y.o. female with a history of GERD and adjustment disorder who presents to the ED for vaginal bleeding.  Patient reports vaginal bleeding for the last 3 days that started after having sexual intercourse.  Patient reports now she has had episodes of dizziness and abdominal cramping.  The bleeding has slowed down and she is seeing some blood when she urinates.  She denies any dysuria.  She is around [redacted] weeks pregnant.  She is G3 P1 Ab1.  Past Medical History:  Diagnosis Date  . Acid reflux 02/04/2015  . 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 Benzodiazepine overdose 11/06/2016  . 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 NO PAST SURGERIES      Allergies Augmentin [amoxicillin-pot clavulanate]  Social History Social History   Tobacco Use  . Smoking status: Light Tobacco Smoker    Packs/day: 0.25  . Smokeless tobacco: Never Used  Substance Use Topics  . Alcohol use: No  . Drug use: No    Review of Systems Constitutional: Negative for fever. Cardiovascular: Negative for chest pain. Respiratory: Negative for shortness of breath. Gastrointestinal: Negative for abdominal pain, vomiting and diarrhea. Genitourinary: Negative for dysuria. positive for vaginal bleeding Musculoskeletal: Negative for back pain. Skin: Negative for rash. Neurological: Negative for headaches, focal weakness or numbness.  All systems  negative/normal/unremarkable except as stated in the HPI  ____________________________________________   PHYSICAL EXAM:  VITAL SIGNS: ED Triage Vitals  Enc Vitals Group     BP 01/31/17 1648 (!) 135/94     Pulse --      Resp 01/31/17 1648 18     Temp 01/31/17 1648 98.6 F (37 C)     Temp Source 01/31/17 1648 Oral     SpO2 01/31/17 1648 98 %     Weight 01/31/17 1656 140 lb (63.5 kg)     Height 01/31/17 1656 5\' 5"  (1.651 m)     Head Circumference --      Peak Flow --      Pain Score 01/31/17 1655 6     Pain Loc --      Pain Edu? --      Excl. in [place]? --     Constitutional: Alert and oriented. Well appearing and in no distress. Eyes: Conjunctivae are normal. Normal extraocular movements. ENT   Head: Normocephalic and atraumatic.   Nose: No congestion/rhinnorhea.   Mouth/Throat: Mucous membranes are moist.   Neck: No stridor. Cardiovascular: Normal rate, regular rhythm. No murmurs, rubs, or gallops. Respiratory: Normal respiratory effort without tachypnea nor retractions. Breath sounds are clear and equal bilaterally. No wheezes/rales/rhonchi. Gastrointestinal: Soft and nontender. Normal bowel sounds Musculoskeletal: Nontender with normal range of motion in extremities. No lower extremity tenderness nor edema. Neurologic:  Normal speech and language. No gross focal neurologic deficits are appreciated.  Skin:  Skin is warm, dry and intact. No rash noted. Psychiatric: Mood and affect are normal. Speech and behavior are normal.  ____________________________________________  ED COURSE:  As part of  my medical decision making, I reviewed the following data within the [place] History obtained from family if available, nursing notes, old chart and ekg, as well as notes from prior ED visits. Patient presented for cramping and vaginal bleeding, we will assess with labs and imaging as indicated at this time.    Procedures ____________________________________________   LABS (pertinent positives/negatives)  Labs Reviewed  HCG, QUANTITATIVE, PREGNANCY - Abnormal; Notable for the following components:      Result Value   hCG, Beta Chain, Quant, S 3,789 (*)    All other components within normal limits  URINALYSIS, COMPLETE (UACMP) WITH MICROSCOPIC - Abnormal; Notable for the following components:   Color, Urine YELLOW (*)    APPearance HAZY (*)    Hgb urine dipstick LARGE (*)    Leukocytes, UA TRACE (*)    Squamous Epithelial / LPF 0-5 (*)    All other components within normal limits  POC URINE PREG, ED - Abnormal; Notable for the following components:   Preg Test, Ur Positive (*)    All other components within normal limits  CBC  BASIC METABOLIC PANEL    RADIOLOGY  Transvaginal ultrasound IMPRESSION: Intrauterine gestation with average crown-rump length of 18 mm and no fetal cardiac activity. Findings meet definitive criteria for failed pregnancy. This follows SRU consensus guidelines: Diagnostic Criteria for Nonviable Pregnancy Early in the First Trimester. Alison Stalling J Med [phone]. ____________________________________________  DIFFERENTIAL DIAGNOSIS   Threatened miscarriage, ectopic, fetal demise, endometriosis, STD  FINAL ASSESSMENT AND PLAN  Fetal demise   Plan: Patient had presented for vaginal bleeding in early pregnancy. Patient's labs were reassuring although the quant was lower than expected. Patient's imaging revealed intrauterine gestation without fetal cardiac activity.  This is a failed pregnancy.  She will be given pain medicine and is referred for outpatient follow-up.   Earleen Newport, MD   Note: This note was generated in part or whole with voice recognition software. Voice recognition is usually quite accurate but there are transcription errors that can and very often do occur. I apologize for any typographical errors that were not detected and  corrected.     Earleen Newport, MD 01/31/17 [phone]

## 2017-01-31 NOTE — ED Notes (Signed)
Patient transported to Ultrasound 

## 2017-01-31 NOTE — ED Notes (Signed)
Reviewed discharge instructions, follow-up care, and prescriptions with patient. Patient verbalized understanding of all information reviewed. Patient stable, with no distress noted at this time.    

## 2017-01-31 NOTE — ED Triage Notes (Signed)
Pt reports she has been having three days of vaginal bleeding since intercourse. Pt reports she now has started having episodes of dizziness when she stands up quickly. Pt is soaking through 1 pad a day.   Pt also reports she is [redacted] weeks pregnant and reports cramping reported in right lower groin and lower back pain.

## 2017-04-08 ENCOUNTER — Encounter: Payer: Self-pay | Admitting: Urology

## 2017-04-08 ENCOUNTER — Ambulatory Visit (INDEPENDENT_AMBULATORY_CARE_PROVIDER_SITE_OTHER): Payer: Self-pay | Admitting: Urology

## 2017-04-08 ENCOUNTER — Other Ambulatory Visit
Admission: RE | Admit: 2017-04-08 | Discharge: 2017-04-08 | Disposition: A | Discharge status: Home / Self Care | Payer: Medicaid Other | Source: Ambulatory Visit | Attending: Urology | Admitting: Urology

## 2017-04-08 VITALS — BP 127/83 | HR 83 | Ht 65.0 in | Wt 146.6 lb

## 2017-04-08 DIAGNOSIS — R300 Dysuria: Secondary | ICD-10-CM | POA: Insufficient documentation

## 2017-04-08 DIAGNOSIS — R3915 Urgency of urination: Secondary | ICD-10-CM

## 2017-04-08 DIAGNOSIS — R350 Frequency of micturition: Secondary | ICD-10-CM

## 2017-04-08 DIAGNOSIS — R351 Nocturia: Secondary | ICD-10-CM

## 2017-04-08 DIAGNOSIS — N941 Unspecified dyspareunia: Secondary | ICD-10-CM

## 2017-04-08 LAB — URINALYSIS, COMPLETE
Bilirubin, UA: NEGATIVE
GLUCOSE, UA: NEGATIVE
Ketones, UA: NEGATIVE
LEUKOCYTES UA: NEGATIVE
NITRITE UA: NEGATIVE
PROTEIN UA: NEGATIVE
RBC, UA: NEGATIVE
Specific Gravity, UA: 1.02 (ref 1.005–1.030)
Urobilinogen, Ur: 0.2 mg/dL (ref 0.2–1.0)
pH, UA: 6.5 (ref 5.0–7.5)

## 2017-04-08 LAB — BLADDER SCAN AMB NON-IMAGING: Scan Result: 21

## 2017-04-08 LAB — PREGNANCY, URINE: PREG TEST UR: NEGATIVE

## 2017-04-08 LAB — MICROSCOPIC EXAMINATION
RBC, UA: NONE SEEN /hpf (ref 0–2)
WBC UA: NONE SEEN /HPF (ref 0–5)

## 2017-04-08 MED ORDER — MIRABEGRON ER 25 MG PO TB24
25.0000 mg | ORAL_TABLET | Freq: Every day | ORAL | 0 refills | Status: DC
Start: 2017-04-08 — End: 2017-10-20

## 2017-04-08 NOTE — Progress Notes (Signed)
04/08/2017 10:33 AM   Michele Bailey 1995-12-28 [id number]  Referring provider: Carmon Ginsberg, [address], [place] [address]  Chief Complaint  Patient presents with  . Establish Care    dysuria prior miscarriage x 02/01/17.     HPI: Patient is a 22 -year-old Caucasian female who is referred to [place] by Carmon Ginsberg, PA for recurrent urinary tract infections.  Patient states that she has had several urinary tract infections over the last year.  Reviewing her records,  she has had two documented positive urine cultures.  .    Her symptoms with a urinary tract infection consist of urgency, dysuria, straining to urinate and "feels like vaginal is going to fall to the ground."  Patient denies any gross hematuria, dysuria or suprapubic/flank pain.  Patient denies any fevers, chills, nausea or vomiting.   Her baseline urinary symptoms consist of frequency (57 times daily), urgency and nocturia (x 10).    She does not have a history of nephrolithiasis, GU surgery or GU trauma.   She is sexually active.  She has noted a correlation with her urinary tract infections and sexual intercourse.  She does not engage in anal sex.  She is voiding before and after sex.  She is having painful intercourse.  She states that she experiences a stabbing pain during deep thrusts.    She is postmenopausal.   She admits to constipation and/or diarrhea.   She does not use tampons.  She does engage in good perineal hygiene. She does not take tub baths.   She has incontinence when she has a really hard sneeze or cough.    She is not having pain with bladder filling, but she feels that she is not getting all the urine out.    She has not had any recent imaging studies.    Over the last two months, she has been trying to drink more water.  She was drinking 4 to 5 Dr. Pepper's/Cokes daily, but she has recently reduced the amount to two Dr. Pepper's/Coke daily.    Her UA is  negative.  Her PVR is 21 mL.  She is smoking a pack of cigarettes every three weeks.     PMH: Past Medical History:  Diagnosis Date  . Acid reflux 02/04/2015  . Acid reflux   . Anxiety   . Depression   . 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 INDUCED ABORTION    . NO PAST SURGERIES      Home Medications:  Allergies as of 04/08/2017      Reactions   Augmentin [amoxicillin-pot Clavulanate] Nausea Only   Willing to take med if necessary with nausea med      Medication List        Accurate as of 04/08/17 11:59 PM. Always use your most recent med list.          mirabegron ER 25 MG Tb24 tablet Commonly known as:  MYRBETRIQ Take 1 tablet (25 mg total) by mouth daily.       Allergies:  Allergies  Allergen Reactions  . Augmentin [Amoxicillin-Pot Clavulanate] Nausea Only    Willing to take med if necessary with nausea med    Family History: Family History  Problem Relation Age of Onset  . Rheum arthritis Mother   . Migraines Mother   . Thyroid disease Mother  hypothyroid  . Hyperlipidemia Father   . Hypertension Father   . Rheum arthritis Maternal Grandfather   . Ovarian cancer Other        mother's great aunt  . Cancer - Other Unknown        fob-father died at age 43y from a type of ca that he was born with    Social History:  reports that she has been smoking.  She has been smoking about 0.25 packs per day. She has never used smokeless tobacco. She reports that she does not drink alcohol or use drugs.  ROS: UROLOGY Frequent Urination?: Yes Hard to postpone urination?: Yes Burning/pain with urination?: No Get up at night to urinate?: Yes Leakage of urine?: No Urine stream starts and stops?: No Trouble starting stream?: No Do you have to strain to urinate?: No Blood in urine?: No Urinary tract infection?: No Sexually transmitted disease?: No Injury to kidneys or  bladder?: No Painful intercourse?: Yes Weak stream?: No Currently pregnant?: No Vaginal bleeding?: No Last menstrual period?: n  Gastrointestinal Nausea?: No Vomiting?: No Indigestion/heartburn?: Yes Diarrhea?: Yes Constipation?: Yes  Constitutional Fever: No Night sweats?: Yes Weight loss?: Yes Fatigue?: No  Skin Skin rash/lesions?: No Itching?: No  Eyes Blurred vision?: No Double vision?: No  Ears/Nose/Throat Sore throat?: No Sinus problems?: No  Hematologic/Lymphatic Swollen glands?: No Easy bruising?: Yes  Cardiovascular Leg swelling?: No Chest pain?: No  Respiratory Cough?: No Shortness of breath?: Yes  Endocrine Excessive thirst?: Yes  Musculoskeletal Back pain?: Yes Joint pain?: Yes  Neurological Headaches?: Yes Dizziness?: Yes  Psychologic Depression?: Yes Anxiety?: Yes  Physical Exam: BP 127/83   Pulse 83   Ht 5\' 5"  (1.651 m)   Wt 146 lb 9.6 oz (66.5 kg)   LMP 11/08/2016   BMI 24.40 kg/m   Constitutional: Well nourished. Alert and oriented, No acute distress. HEENT: [place] AT, moist mucus membranes. Trachea midline, no masses. Cardiovascular: No clubbing, cyanosis, or edema. Respiratory: Normal respiratory effort, no increased work of breathing. GI: Abdomen is soft, non tender, non distended, no abdominal masses. Liver and spleen not palpable.  No hernias appreciated.  Stool sample for occult testing is not indicated.   GU: No CVA tenderness.  No bladder fullness or masses.  Normal external genitalia, normal pubic hair distribution, no lesions.  Normal urethral meatus, no lesions, no prolapse, no discharge.   No urethral masses, tenderness and/or tenderness. No bladder fullness, tenderness or masses. Normal vagina mucosa, good estrogen effect, no discharge, no lesions, good pelvic support, no cystocele or rectocele noted.  No cervical motion tenderness.  Uterus is freely mobile and non-fixed.  No adnexal/parametria masses or tenderness  noted.  Anus and perineum are without rashes or lesions.    Skin: No rashes, bruises or suspicious lesions. Lymph: No cervical or inguinal adenopathy. Neurologic: Grossly intact, no focal deficits, moving all 4 extremities. Psychiatric: Normal mood and affect.  Laboratory Data: Lab Results  Component Value Date   WBC 9.3 01/31/2017   HGB 13.6 01/31/2017   HCT 39.8 01/31/2017   MCV 97.3 01/31/2017   PLT 300 01/31/2017    Lab Results  Component Value Date   CREATININE 0.69 01/31/2017    No results found for: PSA  No results found for: TESTOSTERONE  No results found for: HGBA1C  Lab Results  Component Value Date   TSH 0.715 11/07/2016    No results found for: CHOL, HDL, CHOLHDL, VLDL, LDLCALC  Lab Results  Component Value Date   AST  21 11/06/2016   Lab Results  Component Value Date   ALT 14 11/06/2016   No components found for: ALKALINEPHOPHATASE No components found for: BILIRUBINTOTAL  No results found for: ESTRADIOL  Urinalysis Negative.  See Epic.   I have reviewed the labs.   Pertinent Imaging: Results for LEITHA, HYPPOLITE (MRN [id number]) as of 04/08/2017 16:23  Ref. Range 04/08/2017 15:56  Scan Result Unknown 21     Assessment & Plan:    1. Frequency Given patient sample of Myrbetriq 25 mg daily, # 28 samples; I have advised the patient of the side effects of Myrbetriq, such as: elevation in BP, urinary retention and/or HA RTC in 3 weeks for OAB questionnaire and PVR  2. Urgency Urinalysis, Complete CULTURE, URINE COMPREHENSIVE Bladder Scan (Post Void Residual) in office May need to consider cystoscopy if urine culture is negative and/or Myrbetriq is not effective  3. Nocturia See above  4. Dyspareunia No etiology for dyspareunia found on exam Will need to follow up with gynecology   Return in about 3 weeks (around 04/29/2017) for PVR and OAB questionnaire.       Patient is in the process of acquiring Medicaid and would like to postpone  her follow-up until this is finalized  These notes generated with voice recognition software. I apologize for typographical errors.  Zara Council, [facility] [address], [place], [place] [address] [phone]

## 2017-04-09 ENCOUNTER — Telehealth: Payer: Self-pay

## 2017-04-09 NOTE — Telephone Encounter (Signed)
lmom 

## 2017-04-09 NOTE — Telephone Encounter (Signed)
-----   Message from Nori Riis, PA-C sent at 04/09/2017  7:38 AM EDT ----- Please let Michele Bailey know she is not pregnant.

## 2017-04-09 NOTE — Telephone Encounter (Signed)
Spoke with pt in reference to lab results. Pt voiced understanding.  

## 2017-04-12 LAB — CULTURE, URINE COMPREHENSIVE

## 2017-04-15 ENCOUNTER — Encounter: Payer: Self-pay | Admitting: Obstetrics and Gynecology

## 2017-04-22 ENCOUNTER — Encounter: Payer: Self-pay | Admitting: Obstetrics and Gynecology

## 2017-04-28 ENCOUNTER — Encounter: Payer: Self-pay | Admitting: Obstetrics and Gynecology

## 2017-05-10 ENCOUNTER — Other Ambulatory Visit: Payer: Self-pay

## 2017-05-10 DIAGNOSIS — Z79899 Other long term (current) drug therapy: Secondary | ICD-10-CM | POA: Insufficient documentation

## 2017-05-10 DIAGNOSIS — H6991 Unspecified Eustachian tube disorder, right ear: Secondary | ICD-10-CM | POA: Insufficient documentation

## 2017-05-10 DIAGNOSIS — H9201 Otalgia, right ear: Secondary | ICD-10-CM | POA: Insufficient documentation

## 2017-05-10 DIAGNOSIS — F17210 Nicotine dependence, cigarettes, uncomplicated: Secondary | ICD-10-CM | POA: Insufficient documentation

## 2017-05-10 NOTE — ED Triage Notes (Signed)
Patient reports right ear pain.  States ear "popped" early this morning and has been hurting since.

## 2017-05-11 ENCOUNTER — Emergency Department
Admission: EM | Admit: 2017-05-11 | Discharge: 2017-05-11 | Disposition: A | Discharge status: Home / Self Care | Payer: Self-pay | Attending: Emergency Medicine | Admitting: Emergency Medicine

## 2017-05-11 DIAGNOSIS — H6981 Other specified disorders of Eustachian tube, right ear: Secondary | ICD-10-CM

## 2017-05-11 DIAGNOSIS — H9201 Otalgia, right ear: Secondary | ICD-10-CM

## 2017-05-11 MED ORDER — LIDOCAINE HCL (PF) 1 % IJ SOLN
2.0000 mL | Freq: Once | INTRAMUSCULAR | Status: AC
  Administered 2017-05-11: 2 mL
  Filled 2017-05-11: qty 5

## 2017-05-11 MED ORDER — PREDNISONE 20 MG PO TABS
40.0000 mg | ORAL_TABLET | Freq: Once | ORAL | Status: AC
  Administered 2017-05-11: 40 mg via ORAL
  Filled 2017-05-11: qty 2

## 2017-05-11 MED ORDER — IBUPROFEN 600 MG PO TABS
600.0000 mg | ORAL_TABLET | Freq: Once | ORAL | Status: AC
  Administered 2017-05-11: 600 mg via ORAL
  Filled 2017-05-11: qty 1

## 2017-05-11 MED ORDER — METHYLPREDNISOLONE 4 MG PO TBPK: ORAL_TABLET | ORAL | 0 refills | Status: DC

## 2017-05-11 NOTE — Discharge Instructions (Addendum)
1.  Take steroid taper as prescribed (Medrol Dosepak). 2.  You may take Tylenol and/or ibuprofen as needed for discomfort. 3.  Return to the ER for worsening symptoms, persistent vomiting, difficulty breathing or other concerns.

## 2017-05-11 NOTE — ED Provider Notes (Signed)
[facility] Emergency Department Provider Note   ____________________________________________   First MD Initiated Contact with Patient 05/11/17 0101     (approximate)  I have reviewed the triage vital signs and the nursing notes.   HISTORY  Chief Complaint Otalgia    HPI Michele Bailey is a 22 y.o. female who presents to the ED from home with a chief complaint of right ear pain.  Patient had recent sinus congestion; 3 AM yesterday morning she stretched, yawned and heard her right ear pop.  Denies associated fever, chills, drainage, headache, chest pain, shortness of breath, abdominal pain, nausea or vomiting.  Felt a little dizzy earlier this evening but that has resolved.  States putting a cotton ball in the ear helps her pain.  Denies recent travel or trauma.  Denies recent barotrauma or swimming.   Past Medical History:  Diagnosis Date  . Acid reflux 02/04/2015  . Acid reflux   . Anxiety   . Depression   . 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 Benzodiazepine overdose 11/06/2016  . 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 INDUCED ABORTION    . NO PAST SURGERIES      Prior to Admission medications   Medication Sig Start Date End Date Taking? Authorizing Provider  methylPREDNISolone (MEDROL DOSEPAK) 4 MG TBPK tablet Take as directed 05/11/17   Paulette Blanch, MD  mirabegron ER (MYRBETRIQ) 25 MG TB24 tablet Take 1 tablet (25 mg total) by mouth daily. 04/08/17   Zara Council A, PA-C    Allergies Augmentin [amoxicillin-pot clavulanate]  Family History  Problem Relation Age of Onset  . Rheum arthritis Mother   . Migraines Mother   . Thyroid disease Mother        hypothyroid  . Hyperlipidemia Father   . Hypertension Father   .  Rheum arthritis Maternal Grandfather   . Ovarian cancer Other        mother's great aunt  . Cancer - Other Unknown        fob-father died at age 8y from a type of ca that he was born with    Social History Social History   Tobacco Use  . Smoking status: Light Tobacco Smoker    Packs/day: 0.25  . Smokeless tobacco: Never Used  Substance Use Topics  . Alcohol use: No  . Drug use: No    Review of Systems  Constitutional: No fever/chills. Eyes: No visual changes. ENT: Positive for right ear pain.  No sore throat. Cardiovascular: Denies chest pain. Respiratory: Denies shortness of breath. Gastrointestinal: No abdominal pain.  No nausea, no vomiting.  No diarrhea.  No constipation. Genitourinary: Negative for dysuria. Musculoskeletal: Negative for back pain. Skin: Negative for rash. Neurological: Negative for headaches, focal weakness or numbness.   ____________________________________________   PHYSICAL EXAM:  VITAL SIGNS: ED Triage Vitals  Enc Vitals Group     BP 05/10/17 2212 125/85     Pulse Rate 05/10/17 2212 69     Resp 05/10/17 2212 20     Temp 05/10/17 2212 98.4 F (36.9 C)     Temp Source 05/10/17 2212 Oral     SpO2 05/10/17 2212 100 %     Weight 05/10/17 2213 143 lb (64.9 kg)     Height  05/10/17 2213 5\' 4"  (1.626 m)     Head Circumference --      Peak Flow --      Pain Score 05/10/17 2213 9     Pain Loc --      Pain Edu? --      Excl. in [place]? --     Constitutional: Alert and oriented. Well appearing and in no acute distress. Eyes: Conjunctivae are normal. PERRL. EOMI. Head: Atraumatic. Ears: Left TM with mild fluid, otherwise unremarkable.  Right TM with moderate fluid, non-erythematous, no perforation. Nose: No congestion/rhinnorhea. Mouth/Throat: Mucous membranes are moist.  Oropharynx non-erythematous. Neck: No stridor.  Supple neck without meningismus. Cardiovascular: Normal rate, regular rhythm. Grossly normal heart sounds.  Good peripheral  circulation. Respiratory: Normal respiratory effort.  No retractions. Lungs CTAB. Gastrointestinal: Soft and nontender. No distention. No abdominal bruits. No CVA tenderness. Musculoskeletal: No lower extremity tenderness nor edema.  No joint effusions. Neurologic:  Normal speech and language. No gross focal neurologic deficits are appreciated. No gait instability. Skin:  Skin is warm, dry and intact. No rash noted. Psychiatric: Mood and affect are normal. Speech and behavior are normal.  ____________________________________________   LABS (all labs ordered are listed, but only abnormal results are displayed)  Labs Reviewed - No data to display ____________________________________________  EKG  None ____________________________________________  RADIOLOGY  ED MD interpretation: None  Official radiology report(s): No results found.  ____________________________________________   PROCEDURES  Procedure(s) performed: None  Procedures  Critical Care performed: No  ____________________________________________   INITIAL IMPRESSION / ASSESSMENT AND PLAN / ED COURSE  As part of my medical decision making, I reviewed the following data within the electronic MEDICAL RECORD NUMBER History obtained from family, Nursing notes reviewed and incorporated and Notes from prior ED visits   22 year old female who presents with right otalgia secondary to eustachian tube dysfunction.  Will apply lidocaine drops for pain, Motrin, start Medrol Dosepak and she will follow-up closely with her PCP next week.  Strict return precautions given.  Patient and grandmother verbalize understanding and agree with plan of care.      ____________________________________________   FINAL CLINICAL IMPRESSION(S) / ED DIAGNOSES  Final diagnoses:  Otalgia of right ear  Eustachian tube dysfunction, right     ED Discharge Orders        Ordered    methylPREDNISolone (MEDROL DOSEPAK) 4 MG TBPK tablet      05/11/17 0108       Note:  This document was prepared using Dragon voice recognition software and may include unintentional dictation errors.    Paulette Blanch, MD 05/11/17 [phone]

## 2017-07-02 ENCOUNTER — Encounter: Payer: Medicaid Other | Admitting: Obstetrics and Gynecology

## 2017-07-29 LAB — HM HIV SCREENING LAB: HM HIV Screening: NEGATIVE

## 2017-08-04 ENCOUNTER — Ambulatory Visit (INDEPENDENT_AMBULATORY_CARE_PROVIDER_SITE_OTHER): Payer: Self-pay | Admitting: Family Medicine

## 2017-08-04 ENCOUNTER — Encounter: Payer: Self-pay | Admitting: Family Medicine

## 2017-08-04 ENCOUNTER — Telehealth: Payer: Self-pay

## 2017-08-04 ENCOUNTER — Other Ambulatory Visit: Payer: Self-pay | Admitting: Family Medicine

## 2017-08-04 VITALS — BP 110/80 | HR 71 | Temp 98.8°F | Resp 16 | Wt 147.4 lb

## 2017-08-04 DIAGNOSIS — R05 Cough: Secondary | ICD-10-CM

## 2017-08-04 DIAGNOSIS — R059 Cough, unspecified: Secondary | ICD-10-CM

## 2017-08-04 NOTE — Telephone Encounter (Signed)
Patient called saying that she has coughed up black coffee grounds from her lungs for the last 4-5 months. She reports that this is becoming more frequent. She does have occasional shortness of breath, but reports that this comes and goes. She denies coughing up bright red blood. She sometimes feel that she has to take and "extra" breath at times. She denies dizziness or headaches. Patient wanted to be seen today to discuss this issue. Appt was scheduled for evaluation.

## 2017-08-04 NOTE — Progress Notes (Signed)
  Subjective:     Patient ID: Michele Bailey, female   DOB: 1995-06-27, 22 y.o.   MRN: [id number] Chief Complaint  Patient presents with  . Hemoptysis    Patient reports for the past 4 months she has had brownish/black sputum, she states that sometimes it feels like it gets stuck in your throat and has a hard time coughing sputum up. Patient denies smoking or chronic cough.    HPI States she quit smoking blunts with tobacco/marijuana 2 weeks ago when she learned she was pregnant for the second time. Has noticed occasional sputum production with dark appearing sputum. Feels at time she can't get a good breath. Currently not working and this a source of anxiety for her. She is Medicaid pending. Prior ob was Dr. Marcelline Mates.  Review of Systems     Objective:   Physical Exam  Constitutional: She appears well-developed and well-nourished. No distress.  HENT:  Small tonsils without erythema or exudate.  Pulmonary/Chest: Breath sounds normal. She has no wheezes.       Assessment:    1. Cough with dark sputum: suspect she is clearing her airways after quitting smoking. Will have her try an expectorant for now.     Plan:    Expectorant; discussed she may also be having anxiety about her current situation.

## 2017-08-04 NOTE — Patient Instructions (Signed)
Add expectorant like Robitussin and see if sputum clears as you get farther out from not smoking.

## 2017-09-10 ENCOUNTER — Ambulatory Visit: Payer: Self-pay | Admitting: Family Medicine

## 2017-10-20 ENCOUNTER — Ambulatory Visit (INDEPENDENT_AMBULATORY_CARE_PROVIDER_SITE_OTHER): Payer: Self-pay | Admitting: Obstetrics and Gynecology

## 2017-10-20 ENCOUNTER — Encounter: Payer: Self-pay | Admitting: Obstetrics and Gynecology

## 2017-10-20 VITALS — BP 129/87 | HR 76 | Ht 65.0 in | Wt 156.2 lb

## 2017-10-20 DIAGNOSIS — R399 Unspecified symptoms and signs involving the genitourinary system: Secondary | ICD-10-CM

## 2017-10-20 DIAGNOSIS — N760 Acute vaginitis: Secondary | ICD-10-CM

## 2017-10-20 DIAGNOSIS — Z3A17 17 weeks gestation of pregnancy: Secondary | ICD-10-CM

## 2017-10-20 DIAGNOSIS — B9689 Other specified bacterial agents as the cause of diseases classified elsewhere: Secondary | ICD-10-CM

## 2017-10-20 LAB — POCT URINALYSIS DIPSTICK
Bilirubin, UA: NEGATIVE
Glucose, UA: NEGATIVE
Ketones, UA: NEGATIVE
NITRITE UA: NEGATIVE
PH UA: 6 (ref 5.0–8.0)
Protein, UA: POSITIVE — AB
RBC UA: NEGATIVE
Spec Grav, UA: 1.02 (ref 1.010–1.025)
UROBILINOGEN UA: 0.2 U/dL

## 2017-10-20 LAB — POCT URINALYSIS DIPSTICK OB
BILIRUBIN UA: NEGATIVE
Glucose, UA: NEGATIVE
KETONES UA: NEGATIVE
Nitrite, UA: NEGATIVE
PH UA: 6 (ref 5.0–8.0)
RBC UA: NEGATIVE
Spec Grav, UA: 1.02 (ref 1.010–1.025)
UROBILINOGEN UA: 0.2 U/dL

## 2017-10-20 MED ORDER — METRONIDAZOLE 500 MG PO TABS: 500.0000 mg | ORAL_TABLET | Freq: Two times a day (BID) | ORAL | 0 refills | Status: DC

## 2017-10-20 NOTE — Progress Notes (Signed)
GYNECOLOGY PROGRESS NOTE  Subjective:    Patient ID: Michele Bailey, female    DOB: 29-Aug-1995, 22 y.o.   MRN: [id number]  HPI  Patient is a 22 y.o. G81P1011 female at 17.[redacted] weeks gestation, with EDD 03/27/2017, by 1st trimester sono (inconsistent with LMP of 06/15/2017) of  who presents for complains of possible BV infection. Has had in the past. Noticing a vaginal odor and discharge. Also noting UTI symptoms, mostly dysuria (mild) and itching. Notes that she has a history of recurrent vaginitis in the past.   Patient of note has not yet initiated [facility].  She notes that she was initially planning on terminating her pregnancy, however she decided not to go through with it after having an ultrasound performed. Was seen at [facility] for confirmation, but is planning on getting Medicaid so that she can return to [facility] for her [facility].   The following portions of the patient's history were reviewed and updated as appropriate:   OB History  Gravida Para Term Preterm AB Living  3 1 1  0 1 1  SAB TAB Ectopic Multiple Live Births  1 0 0 0 1    # Outcome Date GA Lbr Len/2nd Weight Sex Delivery Anes PTL Lv  3 Current           2 SAB 2019          1 Term 08/25/15 [redacted]w[redacted]d / 01:24 7 lb 4.1 oz (3.29 kg) F Vag-Spont EPI  LIV     Name: ODALIS, JORDAN     Apgar1: 1  Apgar5: 9    Obstetric Comments  MGF- had twin that passed away age 31yr.  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   She  has a past surgical history that includes Induced abortion.   Her family history includes Cancer - Other in her unknown relative; Esophageal cancer in her father; Hyperlipidemia in her father; Hypertension in her father; Migraines in her mother; Ovarian cancer in her other; Rheum arthritis in her maternal grandfather and mother; Thyroid disease in her mother.   She  reports that she has  quit smoking. She smoked 0.25 packs per day. She has never used smokeless tobacco. She reports that she does not drink alcohol or use drugs.   Current Outpatient Medications on File Prior to Visit  Medication Sig Dispense Refill  . Prenatal Vit-Fe Fumarate-FA (PRENATAL MULTIVITAMIN) TABS tablet Take 1 tablet by mouth daily at 12 noon.     No current facility-administered medications on file prior to visit.    She is allergic to augmentin [amoxicillin-pot clavulanate]..  Review of Systems Pertinent items noted in HPI and remainder of comprehensive ROS otherwise negative.   Objective:   Blood pressure 129/87, pulse 76, height 5\' 5"  (1.651 m), weight 156 lb 3.2 oz (70.9 kg), last menstrual period 06/15/2017. General appearance: alert and no distress Abdomen: soft, non-tender; bowel sounds normal; no masses,  no organomegaly. Gravid uterus. FH ~ 18 cm. FHT present, 160 bpm.  Pelvic: external genitalia normal, rectovaginal septum normal.  Vagina with moderate amount of thin white-gray discharge.  Cervix normal appearing, no lesions and no motion tenderness. Uterus enlarged, gravid, ~ 18 cm. Adnexae non-palpable, nontender bilaterally.  Extremities: extremities normal, atraumatic, no cyanosis or edema Neurologic: Grossly normal   Labs:  Microscopic wet-mount exam shows excessive bacteria, clue cells,  and white blood cells.  Negative for trichomonads. Scant yeast.  KOH done.   Results for orders placed or performed in visit on 10/20/17  POCT urinalysis dipstick  Result Value Ref Range   Color, UA yellow    Clarity, UA clear    Glucose, UA Negative Negative   Bilirubin, UA neg    Ketones, UA neg    Spec Grav, UA 1.020 1.010 - 1.025   Blood, UA neg    pH, UA 6.0 5.0 - 8.0   Protein, UA Positive (A) Negative   Urobilinogen, UA 0.2 0.2 or 1.0 E.U./dL   Nitrite, UA neg    Leukocytes, UA Moderate (2+) (A) Negative   Appearance yellow    Odor    POC Urinalysis Dipstick OB  Result Value  Ref Range   Color, UA yellow    Clarity, UA clear    Glucose, UA Negative Negative   Bilirubin, UA neg    Ketones, UA neg    Spec Grav, UA 1.020 1.010 - 1.025   Blood, UA neg    pH, UA 6.0 5.0 - 8.0   POC Protein UA Trace Negative, Trace   Urobilinogen, UA 0.2 0.2 or 1.0 E.U./dL   Nitrite, UA neg    Leukocytes, UA Moderate (2+) (A) Negative   Appearance yellow    Odor      Assessment:   Bacterial vaginosis UTI symptoms Pregnancy at 17 weeks  Plan:   1. Bacterial vaginosis - will treat with PO Flagyl BID x 7 days 2. UTI symptoms - no evidence of UTI noted on today's UA. Advised on increasing hydration, Azo or cranberry juice for symptomatic relief.  3. Pregnancy at 17 weeks, currently no PNC.  Patient notes she is going to apply for Medicaid today. Can return for prenatal care once she has received her insurance, or can return sooner as self-pay.    [name], MD [facility]

## 2017-10-20 NOTE — Progress Notes (Signed)
Pt is present today due to having yeast infection symptoms. Pt stated that she is having it having itching and burning. No other complaints. Pt has a history of BV and is currently pregnant.

## 2017-10-21 ENCOUNTER — Encounter: Payer: Self-pay | Admitting: Obstetrics and Gynecology

## 2017-10-22 LAB — URINE CULTURE

## 2017-10-24 ENCOUNTER — Encounter: Payer: Self-pay | Admitting: Obstetrics and Gynecology

## 2017-10-28 ENCOUNTER — Encounter: Payer: Self-pay | Admitting: Obstetrics and Gynecology

## 2017-10-28 ENCOUNTER — Ambulatory Visit (INDEPENDENT_AMBULATORY_CARE_PROVIDER_SITE_OTHER): Payer: Self-pay | Admitting: Obstetrics and Gynecology

## 2017-10-28 ENCOUNTER — Other Ambulatory Visit (HOSPITAL_COMMUNITY)
Admission: RE | Admit: 2017-10-28 | Discharge: 2017-10-28 | Disposition: A | Discharge status: Home / Self Care | Payer: Medicaid Other | Source: Ambulatory Visit | Attending: Obstetrics and Gynecology | Admitting: Obstetrics and Gynecology

## 2017-10-28 VITALS — BP 127/89 | HR 89 | Ht 65.0 in | Wt 156.4 lb

## 2017-10-28 DIAGNOSIS — Z3482 Encounter for supervision of other normal pregnancy, second trimester: Secondary | ICD-10-CM | POA: Insufficient documentation

## 2017-10-28 DIAGNOSIS — R399 Unspecified symptoms and signs involving the genitourinary system: Secondary | ICD-10-CM | POA: Insufficient documentation

## 2017-10-28 DIAGNOSIS — Z8619 Personal history of other infectious and parasitic diseases: Secondary | ICD-10-CM | POA: Insufficient documentation

## 2017-10-28 DIAGNOSIS — Z91410 Personal history of adult physical and sexual abuse: Secondary | ICD-10-CM | POA: Insufficient documentation

## 2017-10-28 DIAGNOSIS — Z8659 Personal history of other mental and behavioral disorders: Secondary | ICD-10-CM | POA: Insufficient documentation

## 2017-10-28 DIAGNOSIS — Z8279 Family history of other congenital malformations, deformations and chromosomal abnormalities: Secondary | ICD-10-CM | POA: Insufficient documentation

## 2017-10-28 DIAGNOSIS — O0932 Supervision of pregnancy with insufficient antenatal care, second trimester: Secondary | ICD-10-CM | POA: Insufficient documentation

## 2017-10-28 DIAGNOSIS — Z3A19 19 weeks gestation of pregnancy: Secondary | ICD-10-CM

## 2017-10-28 LAB — POCT URINALYSIS DIPSTICK OB
Bilirubin, UA: NEGATIVE
Blood, UA: NEGATIVE
Glucose, UA: NEGATIVE
Ketones, UA: NEGATIVE
LEUKOCYTES UA: NEGATIVE
NITRITE UA: NEGATIVE
PROTEIN: NEGATIVE
SPEC GRAV UA: 1.015 (ref 1.010–1.025)
Urobilinogen, UA: 0.2 E.U./dL
pH, UA: 7 (ref 5.0–8.0)

## 2017-10-28 LAB — HM PAP SMEAR: HM Pap smear: NEGATIVE

## 2017-10-28 NOTE — Patient Instructions (Signed)

## 2017-10-28 NOTE — Progress Notes (Signed)
ROB-pt stated that she is doing well no complaints.        Judson Roch Hynek presents for NOB nurse intake visit. Pregnancy confirmation done at [facility] ,June 26, 2017, with nurse at [facility].  G4.  P1001.  LMP 06/15/17.  EDD 03/22/18.  Ga. Pregnancy education material explained and given.  0 cats in the home.  NOB labs ordered. Sickle cell order due to race of baby's father. HIV and drug screen explained and ordered. Genetic screening discussed. Genetic testing; Unsure. Pt to discuss genetic testing with provider. PNV encouraged. Pt to follow up with provider in 0 weeks for NOB physical.
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
## 2017-10-29 LAB — URINALYSIS, ROUTINE W REFLEX MICROSCOPIC
Bilirubin, UA: NEGATIVE
GLUCOSE, UA: NEGATIVE
Ketones, UA: NEGATIVE
LEUKOCYTES UA: NEGATIVE
Nitrite, UA: NEGATIVE
PROTEIN UA: NEGATIVE
RBC, UA: NEGATIVE
Specific Gravity, UA: 1.023 (ref 1.005–1.030)
Urobilinogen, Ur: 0.2 mg/dL (ref 0.2–1.0)
pH, UA: 7 (ref 5.0–7.5)

## 2017-10-29 LAB — ABO AND RH: Rh Factor: POSITIVE

## 2017-10-29 LAB — RPR: RPR Ser Ql: NONREACTIVE

## 2017-10-29 LAB — HEPATITIS B SURFACE ANTIGEN: HEP B S AG: NEGATIVE

## 2017-10-29 LAB — VARICELLA ZOSTER ANTIBODY, IGG: Varicella zoster IgG: 272 index (ref >165)

## 2017-10-29 LAB — HIV ANTIBODY (ROUTINE TESTING W REFLEX): HIV SCREEN 4TH GENERATION: NONREACTIVE

## 2017-10-29 LAB — HGB SOLU + RFLX FRAC: Sickle Solubility Test - HGBRFX: NEGATIVE

## 2017-10-29 LAB — RUBELLA SCREEN: RUBELLA: 2.83 {index} (ref >0.99)

## 2017-10-30 LAB — DRUG PROFILE, UR, 9 DRUGS (LABCORP)

## 2017-10-30 LAB — CYTOLOGY - PAP
Chlamydia: NEGATIVE
DIAGNOSIS: NEGATIVE
NEISSERIA GONORRHEA: NEGATIVE

## 2017-10-30 LAB — GC/CHLAMYDIA PROBE AMP
CHLAMYDIA, DNA PROBE: NEGATIVE
Neisseria gonorrhoeae by PCR: NEGATIVE

## 2017-10-30 LAB — NICOTINE SCREEN, URINE

## 2017-10-31 LAB — CULTURE, OB URINE

## 2017-10-31 LAB — URINE CULTURE, OB REFLEX

## 2017-11-04 ENCOUNTER — Ambulatory Visit (INDEPENDENT_AMBULATORY_CARE_PROVIDER_SITE_OTHER): Payer: Self-pay

## 2017-11-04 DIAGNOSIS — Z3482 Encounter for supervision of other normal pregnancy, second trimester: Secondary | ICD-10-CM

## 2017-11-04 DIAGNOSIS — O0932 Supervision of pregnancy with insufficient antenatal care, second trimester: Secondary | ICD-10-CM

## 2017-11-04 DIAGNOSIS — Z363 Encounter for antenatal screening for malformations: Secondary | ICD-10-CM

## 2017-11-13 ENCOUNTER — Telehealth: Payer: Self-pay

## 2017-11-13 NOTE — Telephone Encounter (Signed)
Pt called back and is aware of her genetic testing and the sex of her baby.

## 2017-11-13 NOTE — Telephone Encounter (Signed)
Pt was called no answer LM via voicemail that her genetic testing was available and informed pt to call the office for test results.

## 2017-11-25 ENCOUNTER — Encounter: Payer: Self-pay | Admitting: Obstetrics and Gynecology

## 2017-11-25 ENCOUNTER — Ambulatory Visit (INDEPENDENT_AMBULATORY_CARE_PROVIDER_SITE_OTHER): Payer: Self-pay | Admitting: Obstetrics and Gynecology

## 2017-11-25 VITALS — BP 120/80 | HR 84 | Wt 162.0 lb

## 2017-11-25 DIAGNOSIS — Z3482 Encounter for supervision of other normal pregnancy, second trimester: Secondary | ICD-10-CM

## 2017-11-25 DIAGNOSIS — B9689 Other specified bacterial agents as the cause of diseases classified elsewhere: Secondary | ICD-10-CM

## 2017-11-25 DIAGNOSIS — N760 Acute vaginitis: Secondary | ICD-10-CM

## 2017-11-25 DIAGNOSIS — Z23 Encounter for immunization: Secondary | ICD-10-CM

## 2017-11-25 LAB — POCT URINALYSIS DIPSTICK OB
Bilirubin, UA: NEGATIVE
Blood, UA: NEGATIVE
Glucose, UA: NEGATIVE
KETONES UA: NEGATIVE
NITRITE UA: NEGATIVE
POC,PROTEIN,UA: NEGATIVE
Spec Grav, UA: 1.01 (ref 1.010–1.025)
Urobilinogen, UA: 0.2 E.U./dL
pH, UA: 7 (ref 5.0–8.0)

## 2017-11-25 MED ORDER — METRONIDAZOLE 500 MG PO TABS: 500.0000 mg | ORAL_TABLET | Freq: Two times a day (BID) | ORAL | 0 refills | Status: AC

## 2017-11-25 NOTE — Progress Notes (Signed)
Pt here today for ROB. Pt states that she may have a yeast infection. Pt claims to have  a history of BV.

## 2017-11-25 NOTE — Progress Notes (Signed)
ROB: Patient complains of vaginal discharge with odor mild itching.  She states that she frequently gets bacterial vaginosis and she is "sure this is what it is".  Declines pelvic examination but specifically requesting treatment.  Declines GC/CT cultures.  1 hour GCT next visit

## 2017-11-26 ENCOUNTER — Other Ambulatory Visit: Payer: Self-pay

## 2017-11-26 ENCOUNTER — Ambulatory Visit (INDEPENDENT_AMBULATORY_CARE_PROVIDER_SITE_OTHER): Payer: Self-pay | Admitting: Family Medicine

## 2017-11-26 ENCOUNTER — Encounter: Payer: Self-pay | Admitting: Family Medicine

## 2017-11-26 VITALS — BP 118/80 | HR 84 | Temp 98.5°F | Ht 64.0 in | Wt 163.2 lb

## 2017-11-26 DIAGNOSIS — R21 Rash and other nonspecific skin eruption: Secondary | ICD-10-CM

## 2017-11-26 NOTE — Patient Instructions (Signed)
Try hydrocortisone cream 2-3 x day for bumps on your arms. For your right ear quit using the ear buds for a while.

## 2017-11-26 NOTE — Progress Notes (Signed)
  Subjective:     Patient ID: Michele Bailey, female   DOB: Aug 31, 1995, 22 y.o.   MRN: [id number] Chief Complaint  Patient presents with  . bumps    on hands, arm and in right ear for a while, no itching but hurts   HPI States the bumps on her arm preceded her pregnancy now at 23.5 weeks. States she uses an ear bud in her right ear and noticed two tender bumps in her external canal. Reports that her ob-gyn did not believe these were related to her pregnancy.  Review of Systems     Objective:   Physical Exam  Constitutional: She appears well-developed and well-nourished. No distress.  Skin:  1-2 mm.papules x 3 on her right arm. No vesicles or crusting. Non-tender to the touch. Also has on the dorsum of her right hand a deflated hemorrhagic vesicle (mom popped this at home). Last, has what appears to be two 2-3 mm cysts in her right external ear canal.       Assessment:    1. Localized papular rash: try hydrocortisone cream    Plan:    Stop ear bud in right ear. Call for dermatology referral if no response to treatment.

## 2017-12-09 ENCOUNTER — Telehealth: Payer: Self-pay | Admitting: Obstetrics and Gynecology

## 2017-12-09 NOTE — Telephone Encounter (Signed)
Pt was called and she stated that she has sharp pain in her middle abd area that lasts all day. Pt stated that stated that it hurt to breath for 13 minutes. Pt was asked if she took any medication to help with the pain. Pt stated that she do not like taking medication. Pt was advised to elevate her feet, take it easy, try a warm bath for the pain, and stay hydrated. Pt stated that she would try that.

## 2017-12-09 NOTE — Telephone Encounter (Signed)
The patient states she is having some pain and tightness near her left ovary to middle of abdomen; and takes her breath and she cannot go back to sleep for the last few weeks.  She is asking for a call from her nurse, please advise, thanks.

## 2017-12-22 NOTE — Telephone Encounter (Signed)
Pt called to check on her to see how she was doing since the last time we had spoken. No answer LM via voicemail that I was just calling to check on her.

## 2017-12-24 ENCOUNTER — Other Ambulatory Visit: Payer: Medicaid Other

## 2017-12-24 ENCOUNTER — Ambulatory Visit (INDEPENDENT_AMBULATORY_CARE_PROVIDER_SITE_OTHER): Payer: Medicaid Other | Admitting: Obstetrics and Gynecology

## 2017-12-24 VITALS — BP 128/83 | HR 96 | Wt 174.8 lb

## 2017-12-24 DIAGNOSIS — Z23 Encounter for immunization: Secondary | ICD-10-CM | POA: Diagnosis not present

## 2017-12-24 DIAGNOSIS — R350 Frequency of micturition: Secondary | ICD-10-CM

## 2017-12-24 DIAGNOSIS — Z3482 Encounter for supervision of other normal pregnancy, second trimester: Secondary | ICD-10-CM

## 2017-12-24 DIAGNOSIS — O9989 Other specified diseases and conditions complicating pregnancy, childbirth and the puerperium: Secondary | ICD-10-CM

## 2017-12-24 LAB — POCT URINALYSIS DIPSTICK OB
Bilirubin, UA: NEGATIVE
Blood, UA: NEGATIVE
Glucose, UA: NEGATIVE
KETONES UA: NEGATIVE
LEUKOCYTES UA: NEGATIVE
Nitrite, UA: NEGATIVE
POC,PROTEIN,UA: NEGATIVE
SPEC GRAV UA: 1.025 (ref 1.010–1.025)
Urobilinogen, UA: 0.2 E.U./dL
pH, UA: 6 (ref 5.0–8.0)

## 2017-12-24 MED ORDER — TETANUS-DIPHTH-ACELL PERTUSSIS 5-2.5-18.5 LF-MCG/0.5 IM SUSP
0.5000 mL | Freq: Once | INTRAMUSCULAR | Status: AC
  Administered 2017-12-24: 0.5 mL via INTRAMUSCULAR

## 2017-12-24 NOTE — Addendum Note (Signed)
Addended by: Edwyna Shell on: 12/24/2017 09:25 AM   Modules accepted: Orders

## 2017-12-24 NOTE — Progress Notes (Signed)
ROB-pt stated that she was doing well no complaints. Tdap administered and blood consent form signed today. Pt stated that she think she may have an UTI.

## 2017-12-24 NOTE — Progress Notes (Signed)
ROB: Patient thinks she might have a UTI.  Notes urinary frequency but only voids small amounts. UA normal today.  For 28 week labs today.  Desires to bottle, may consider breastfeeding,  desires Depo Provera for contraception. For Tdap today, signed blood consent.  Will repeat UDS as last one had insufficient urine sample.

## 2017-12-25 ENCOUNTER — Telehealth: Payer: Self-pay

## 2017-12-25 LAB — CBC
Hematocrit: 36.9 % (ref 34.0–46.6)
Hemoglobin: 12.9 g/dL (ref 11.1–15.9)
MCH: 32.7 pg (ref 26.6–33.0)
MCHC: 35 g/dL (ref 31.5–35.7)
MCV: 94 fL (ref 79–97)
Platelets: 377 10*3/uL (ref 150–450)
RBC: 3.94 x10E6/uL (ref 3.77–5.28)
RDW: 11.7 % — AB (ref 12.3–15.4)
WBC: 10.6 10*3/uL (ref 3.4–10.8)

## 2017-12-25 LAB — DRUG PROFILE, UR, 9 DRUGS (LABCORP)
AMPHETAMINES, URINE: NEGATIVE ng/mL
BARBITURATE QUANT UR: NEGATIVE ng/mL
Benzodiazepine Quant, Ur: NEGATIVE ng/mL
CANNABINOID QUANT UR: NEGATIVE ng/mL
COCAINE (METAB.): NEGATIVE ng/mL
Methadone Screen, Urine: NEGATIVE ng/mL
OPIATE QUANT UR: NEGATIVE ng/mL
PCP Quant, Ur: NEGATIVE ng/mL
PROPOXYPHENE: NEGATIVE ng/mL

## 2017-12-25 LAB — RPR: RPR Ser Ql: NONREACTIVE

## 2017-12-25 LAB — GLUCOSE, 1 HOUR GESTATIONAL: GESTATIONAL DIABETES SCREEN: 129 mg/dL (ref 65–139)

## 2017-12-25 NOTE — Telephone Encounter (Signed)
Pt was called no answer LM via voicemail  Informing pt that her u/s disk was ready and is the front desk for pick up.

## 2017-12-25 NOTE — Telephone Encounter (Signed)
Pt return phone call to office and is aware that her u/s disk is at the office and available for pick up.

## 2018-01-12 ENCOUNTER — Encounter: Payer: Self-pay | Admitting: Obstetrics and Gynecology

## 2018-01-12 ENCOUNTER — Ambulatory Visit (INDEPENDENT_AMBULATORY_CARE_PROVIDER_SITE_OTHER): Payer: Medicaid Other | Admitting: Obstetrics and Gynecology

## 2018-01-12 VITALS — BP 120/82 | HR 94 | Wt 180.1 lb

## 2018-01-12 DIAGNOSIS — N3001 Acute cystitis with hematuria: Secondary | ICD-10-CM

## 2018-01-12 DIAGNOSIS — Z3482 Encounter for supervision of other normal pregnancy, second trimester: Secondary | ICD-10-CM

## 2018-01-12 LAB — POCT URINALYSIS DIPSTICK OB
Bilirubin, UA: NEGATIVE
Glucose, UA: NEGATIVE
KETONES UA: NEGATIVE
Nitrite, UA: NEGATIVE
PH UA: 6.5 (ref 5.0–8.0)
POC,PROTEIN,UA: NEGATIVE
Spec Grav, UA: 1.015 (ref 1.010–1.025)
UROBILINOGEN UA: 0.2 U/dL

## 2018-01-12 MED ORDER — NITROFURANTOIN MONOHYD MACRO 100 MG PO CAPS: 100.0000 mg | ORAL_CAPSULE | Freq: Two times a day (BID) | ORAL | 1 refills | Status: DC

## 2018-01-12 NOTE — Progress Notes (Signed)
ROB: Patient has occasional cramping in the midline.  Denies bleeding reports daily fetal movement.  UA consistent with UTI.  Macrobid given.

## 2018-01-14 NOTE — L&D Delivery Note (Signed)
Delivery Summary for Michele Bailey  Labor Events:   Preterm labor:   Rupture date: 01/31/2018  Rupture time: 4:00 AM  Rupture type: Spontaneous Possible ROM - for evaluation  Fluid Color:   Induction:   Augmentation:   Complications:   Cervical ripening:          Delivery:   Episiotomy:   Lacerations:   Repair suture:   Repair # of packets:   Blood loss (ml):    Information for the patient's newborn:  Shirely, Toren [[id number]]    Delivery 02/08/2018 7:00 PM by  Vaginal, Spontaneous Sex:  female Gestational Age: [redacted]w[redacted]d Delivery Clinician:   Living?:         APGARS  One minute Five minutes Ten minutes  Skin color:        Heart rate:        Grimace:        Muscle tone:        Breathing:        Totals: 8  9      Presentation/position:      Resuscitation:   Cord information:    Disposition of cord blood:     Blood gases sent?  Complications:   Placenta: Delivered:       appearance Newborn Measurements: Weight: 5 lb 2.2 oz (2330 g)  Height: 17.72"  Head circumference:    Chest circumference:    Other providers:    Additional  information: Forceps:   Vacuum:   Breech:   Observed anomalies        Delivery Note At 7:00 PM a viable and healthy female was delivered via Vaginal, Spontaneous (Presentation: Vertex; LOA position).  APGAR: 8, 9; weight 5 lb 2.2 oz (2330 g).   Placenta status: spontaneously removed, intact.  Cord: 3-vessel with the following complications: none.  Cord pH: not obtained.  Anesthesia:  Epidural received ~ 1 hour prior to delivery. Episiotomy: None Lacerations: None Suture Repair: None Est. Blood Loss (mL):  350 ml  Mom to postpartum.  Baby to [facility].   [name], MD [facility] 02/08/2018 7:52 PM

## 2018-01-20 ENCOUNTER — Telehealth: Payer: Self-pay | Admitting: Obstetrics and Gynecology

## 2018-01-20 NOTE — Telephone Encounter (Signed)
Patient called and stated she may a possible UTI, and  Would like to schedule an appointment if possible. Please advise.
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
## 2018-02-01 ENCOUNTER — Other Ambulatory Visit: Payer: Self-pay

## 2018-02-01 ENCOUNTER — Inpatient Hospital Stay
Admission: EM | Admit: 2018-02-01 | Discharge: 2018-02-10 | DRG: 806 | Disposition: A | Discharge status: Home / Self Care | Payer: Medicaid Other | Attending: Obstetrics and Gynecology | Admitting: Obstetrics and Gynecology

## 2018-02-01 DIAGNOSIS — Z3A33 33 weeks gestation of pregnancy: Secondary | ICD-10-CM | POA: Diagnosis not present

## 2018-02-01 DIAGNOSIS — O9832 Other infections with a predominantly sexual mode of transmission complicating childbirth: Secondary | ICD-10-CM | POA: Diagnosis present

## 2018-02-01 DIAGNOSIS — O1403 Mild to moderate pre-eclampsia, third trimester: Secondary | ICD-10-CM | POA: Diagnosis not present

## 2018-02-01 DIAGNOSIS — Z3A34 34 weeks gestation of pregnancy: Secondary | ICD-10-CM | POA: Diagnosis not present

## 2018-02-01 DIAGNOSIS — O42913 Preterm premature rupture of membranes, unspecified as to length of time between rupture and onset of labor, third trimester: Principal | ICD-10-CM

## 2018-02-01 DIAGNOSIS — O42113 Preterm premature rupture of membranes, onset of labor more than 24 hours following rupture, third trimester: Secondary | ICD-10-CM | POA: Diagnosis not present

## 2018-02-01 DIAGNOSIS — O1404 Mild to moderate pre-eclampsia, complicating childbirth: Secondary | ICD-10-CM | POA: Diagnosis present

## 2018-02-01 DIAGNOSIS — A6000 Herpesviral infection of urogenital system, unspecified: Secondary | ICD-10-CM | POA: Diagnosis present

## 2018-02-01 DIAGNOSIS — O42919 Preterm premature rupture of membranes, unspecified as to length of time between rupture and onset of labor, unspecified trimester: Secondary | ICD-10-CM | POA: Diagnosis present

## 2018-02-01 DIAGNOSIS — O4290 Premature rupture of membranes, unspecified as to length of time between rupture and onset of labor, unspecified weeks of gestation: Secondary | ICD-10-CM

## 2018-02-01 LAB — CBC
HCT: 36.2 % (ref 36.0–46.0)
Hemoglobin: 12.2 g/dL (ref 12.0–15.0)
MCH: 32.2 pg (ref 26.0–34.0)
MCHC: 33.7 g/dL (ref 30.0–36.0)
MCV: 95.5 fL (ref 80.0–100.0)
Platelets: 357 10*3/uL (ref 150–400)
RBC: 3.79 MIL/uL — ABNORMAL LOW (ref 3.87–5.11)
RDW: 12.4 % (ref 11.5–15.5)
WBC: 10 10*3/uL (ref 4.0–10.5)
nRBC: 0 % (ref 0.0–0.2)

## 2018-02-01 LAB — URINE DRUG SCREEN, QUALITATIVE (ARMC ONLY)
Amphetamines, Ur Screen: NOT DETECTED
Barbiturates, Ur Screen: NOT DETECTED
Benzodiazepine, Ur Scrn: NOT DETECTED
Cannabinoid 50 Ng, Ur ~~LOC~~: NOT DETECTED
Cocaine Metabolite,Ur ~~LOC~~: NOT DETECTED
MDMA (Ecstasy)Ur Screen: NOT DETECTED
Methadone Scn, Ur: NOT DETECTED
Opiate, Ur Screen: NOT DETECTED
Phencyclidine (PCP) Ur S: NOT DETECTED
Tricyclic, Ur Screen: NOT DETECTED

## 2018-02-01 LAB — CREATININE, SERUM
Creatinine, Ser: 0.57 mg/dL (ref 0.44–1.00)
GFR calc Af Amer: >60 mL/min (ref >60)
GFR calc non Af Amer: >60 mL/min (ref >60)

## 2018-02-01 LAB — ROM PLUS (ARMC ONLY): Rom Plus: POSITIVE

## 2018-02-01 LAB — TYPE AND SCREEN
ABO/RH(D): B POS
ANTIBODY SCREEN: NEGATIVE

## 2018-02-01 LAB — CHLAMYDIA/NGC RT PCR (ARMC ONLY)
Chlamydia Tr: NOT DETECTED
N gonorrhoeae: NOT DETECTED

## 2018-02-01 LAB — GROUP B STREP BY PCR: GROUP B STREP BY PCR: NEGATIVE

## 2018-02-01 MED ORDER — OXYTOCIN 10 UNIT/ML IJ SOLN
INTRAMUSCULAR | Status: AC
  Filled 2018-02-01: qty 2

## 2018-02-01 MED ORDER — MAGNESIUM SULFATE BOLUS VIA INFUSION
4.0000 g | Freq: Once | INTRAVENOUS | Status: AC
  Administered 2018-02-01: 4 g via INTRAVENOUS
  Filled 2018-02-01: qty 500

## 2018-02-01 MED ORDER — SODIUM CHLORIDE 0.9 % IV SOLN
2.0000 g | Freq: Four times a day (QID) | INTRAVENOUS | Status: DC
  Administered 2018-02-01 – 2018-02-02 (×6): 2 g via INTRAVENOUS
  Filled 2018-02-01 (×7): qty 2000
  Filled 2018-02-01: qty 2
  Filled 2018-02-01: qty 2000

## 2018-02-01 MED ORDER — BETAMETHASONE SOD PHOS & ACET 6 (3-3) MG/ML IJ SUSP
12.0000 mg | Freq: Once | INTRAMUSCULAR | Status: AC
  Administered 2018-02-01: 12 mg via INTRAMUSCULAR

## 2018-02-01 MED ORDER — SODIUM CHLORIDE 0.9 % IV SOLN
500.0000 mg | INTRAVENOUS | Status: AC
  Administered 2018-02-01 (×2): 500 mg via INTRAVENOUS
  Filled 2018-02-01 (×2): qty 500

## 2018-02-01 MED ORDER — MAGNESIUM SULFATE 40 G IN LACTATED RINGERS - SIMPLE
INTRAVENOUS | Status: AC
  Administered 2018-02-01: 4 g via INTRAVENOUS
  Filled 2018-02-01: qty 500

## 2018-02-01 MED ORDER — VALACYCLOVIR HCL 500 MG PO TABS
500.0000 mg | ORAL_TABLET | Freq: Two times a day (BID) | ORAL | Status: DC
  Administered 2018-02-01 – 2018-02-07 (×14): 500 mg via ORAL
  Filled 2018-02-01 (×17): qty 1

## 2018-02-01 MED ORDER — OXYTOCIN 40 UNITS IN NORMAL SALINE INFUSION - SIMPLE MED
INTRAVENOUS | Status: AC
  Filled 2018-02-01: qty 1000

## 2018-02-01 MED ORDER — PRENATAL MULTIVITAMIN CH
1.0000 | ORAL_TABLET | Freq: Every day | ORAL | Status: DC
  Administered 2018-02-01 – 2018-02-07 (×5): 1 via ORAL
  Filled 2018-02-01 (×7): qty 1

## 2018-02-01 MED ORDER — MISOPROSTOL 200 MCG PO TABS
ORAL_TABLET | ORAL | Status: AC
  Filled 2018-02-01: qty 4

## 2018-02-01 MED ORDER — LIDOCAINE HCL (PF) 1 % IJ SOLN
INTRAMUSCULAR | Status: AC
  Filled 2018-02-01: qty 30

## 2018-02-01 MED ORDER — ACETAMINOPHEN 325 MG PO TABS
650.0000 mg | ORAL_TABLET | ORAL | Status: DC | PRN
  Administered 2018-02-02 – 2018-02-06 (×5): 650 mg via ORAL
  Administered 2018-02-06: 325 mg via ORAL
  Filled 2018-02-01 (×6): qty 2

## 2018-02-01 MED ORDER — ENOXAPARIN SODIUM 40 MG/0.4ML ~~LOC~~ SOLN
40.0000 mg | SUBCUTANEOUS | Status: DC
  Administered 2018-02-01 – 2018-02-07 (×7): 40 mg via SUBCUTANEOUS
  Filled 2018-02-01 (×7): qty 0.4

## 2018-02-01 MED ORDER — AMMONIA AROMATIC IN INHA
RESPIRATORY_TRACT | Status: AC
  Filled 2018-02-01: qty 10

## 2018-02-01 MED ORDER — MAGNESIUM SULFATE 40 G IN LACTATED RINGERS - SIMPLE
2.0000 g/h | INTRAVENOUS | Status: DC
  Administered 2018-02-01 – 2018-02-02 (×2): 2 g/h via INTRAVENOUS
  Filled 2018-02-01: qty 500

## 2018-02-01 MED ORDER — AZITHROMYCIN 1 G PO PACK: 1.0000 g | PACK | Freq: Once | ORAL | Status: DC

## 2018-02-01 MED ORDER — DEXTROSE IN LACTATED RINGERS 5 % IV SOLN
INTRAVENOUS | Status: DC
  Administered 2018-02-01 – 2018-02-03 (×4): via INTRAVENOUS

## 2018-02-01 MED ORDER — CALCIUM CARBONATE ANTACID 500 MG PO CHEW
2.0000 | CHEWABLE_TABLET | ORAL | Status: DC | PRN
  Administered 2018-02-02 – 2018-02-06 (×3): 400 mg via ORAL
  Filled 2018-02-01 (×3): qty 2

## 2018-02-01 MED ORDER — ZOLPIDEM TARTRATE 5 MG PO TABS
5.0000 mg | ORAL_TABLET | Freq: Every evening | ORAL | Status: DC | PRN
  Administered 2018-02-02 – 2018-02-07 (×5): 5 mg via ORAL
  Filled 2018-02-01 (×5): qty 1

## 2018-02-01 MED ORDER — BETAMETHASONE SOD PHOS & ACET 6 (3-3) MG/ML IJ SUSP
12.0000 mg | INTRAMUSCULAR | Status: DC
  Administered 2018-02-02: 12 mg via INTRAMUSCULAR
  Filled 2018-02-01: qty 2

## 2018-02-01 MED ORDER — CALCIUM GLUCONATE 10 % IV SOLN
INTRAVENOUS | Status: AC
  Filled 2018-02-01: qty 10
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
## 2018-02-02 DIAGNOSIS — O42913 Preterm premature rupture of membranes, unspecified as to length of time between rupture and onset of labor, third trimester: Secondary | ICD-10-CM

## 2018-02-02 DIAGNOSIS — Z3A33 33 weeks gestation of pregnancy: Secondary | ICD-10-CM

## 2018-02-02 MED ORDER — SODIUM CHLORIDE 0.9 % IV SOLN
2.0000 g | Freq: Four times a day (QID) | INTRAVENOUS | Status: DC
  Administered 2018-02-02 – 2018-02-08 (×21): 2 g via INTRAVENOUS
  Filled 2018-02-02: qty 2
  Filled 2018-02-02 (×2): qty 2000
  Filled 2018-02-02: qty 2
  Filled 2018-02-02: qty 2000
  Filled 2018-02-02: qty 2
  Filled 2018-02-02: qty 2000
  Filled 2018-02-02 (×2): qty 2
  Filled 2018-02-02: qty 2000
  Filled 2018-02-02 (×10): qty 2
  Filled 2018-02-02: qty 2000
  Filled 2018-02-02 (×3): qty 2

## 2018-02-02 MED ORDER — SODIUM CHLORIDE FLUSH 0.9 % IV SOLN
INTRAVENOUS | Status: AC
  Filled 2018-02-02: qty 10

## 2018-02-02 MED ORDER — DOCUSATE SODIUM 100 MG PO CAPS
100.0000 mg | ORAL_CAPSULE | Freq: Two times a day (BID) | ORAL | Status: DC
  Administered 2018-02-02 – 2018-02-07 (×11): 100 mg via ORAL
  Filled 2018-02-02 (×11): qty 1

## 2018-02-02 MED ORDER — SODIUM CHLORIDE 0.9 % IV SOLN
INTRAVENOUS | Status: DC | PRN
  Administered 2018-02-02 – 2018-02-05 (×6): 250 mL via INTRAVENOUS
  Administered 2018-02-07 – 2018-02-08 (×4): 500 mL via INTRAVENOUS

## 2018-02-02 MED ORDER — PRENATAL MULTIVITAMIN CH
1.0000 | ORAL_TABLET | Freq: Every day | ORAL | Status: DC
  Administered 2018-02-04 – 2018-02-06 (×3): 1 via ORAL

## 2018-02-02 NOTE — Progress Notes (Signed)
  Antenatal Progress Note  Subjective:     Patient ID: Delinda Malan Wilbourne is a 23 y.o. female [redacted]w[redacted]d, Estimated Date of Delivery: 03/22/18 by patient's last menstrual period 06/15/2017 (consistent with first trimester sono).  She was admitted for PPROM.  HD# 2.   Subjective:  Patient denies complaints today.   Review of Systems Denies contractions, fevers, chills, abdominal pain, vaginal bleeding, and reports good fetal movement.     Objective:   Vitals:   02/02/18 1015 02/02/18 1020 02/02/18 1025 02/02/18 1028  BP:    124/76  Pulse:    93  Resp:      Temp:      TempSrc:      SpO2: 98% 98% 99%   Weight:      Height:        General appearance: alert and no distress Lungs: clear to auscultation bilaterally Heart: regular rate and rhythm, S1, S2 normal, no murmur, click, rub or gallop Abdomen: soft, non-tender; bowel sounds normal; no masses,  no organomegaly Pelvic: deferred.  Per last exam at admission, was 3.5/60/-3 Extremities: extremities normal, atraumatic, no cyanosis or edema   FHT: baseline 135 bpm, accels present/absent, decels present/absent.  Variability: moderate Toco: no contractions   Labs:  Rapid GBS negative GC/CT negative Tox screen negative  Lab Results  Component Value Date   WBC 10.0 02/01/2018   HGB 12.2 02/01/2018   HCT 36.2 02/01/2018   MCV 95.5 02/01/2018   PLT 357 02/01/2018    Lab Results  Component Value Date   ABORH B POS 02/01/2018    Assessment:  23 y.o. female [redacted]w[redacted]d, Estimated Date of Delivery: 03/22/18 with:  1. PPROM  2. HSV II history  Plan:   1. Continue antibiotics for latency. 2. Continue course of antenatal steroids. Second dose due this morning.  3. Continue Magnesium sulfate for now 4. Continue HSV prophylaxis.  5. Continue fetal monitoring for now. Can transfer to antepartum later today if she continues to remain stable.    [name], MD [facility]

## 2018-02-02 NOTE — Lactation Note (Signed)
Lactation Consultation Note  Patient Name: Michele Bailey Date: 02/02/2018   Mom is undelivered at 72 weeks.  Mom reporting that she wants to breast feed and would like to talk with lactation.  Mom concerned that if she delivers early that she will not be able to breast feed.  Discussed in detail that we would need to get her pumping as soon as possible, preferrably in first couple of hours after delivery to stimulate breast to begin producing milk.  Explained supply and demand and normal course of lactation.  Discussed getting DEBP from [facility] if she went home before baby was discharged from [facility]. Reassured mom that lactation and nurses caring for her would reinforce information needed to help her supply breast milk for her baby if had to go to [facility].  Lactation name and number written on white board and encouraged to call with any further questions or concerns.  Maternal Data    Feeding    LATCH Score                   Interventions    Lactation Tools Discussed/Used     Consult Status      Michele Bailey 02/02/2018, 9:51 PM

## 2018-02-03 ENCOUNTER — Encounter: Payer: Self-pay | Admitting: Obstetrics and Gynecology

## 2018-02-03 LAB — CBC
HCT: 33.1 % — ABNORMAL LOW (ref 36.0–46.0)
Hemoglobin: 10.9 g/dL — ABNORMAL LOW (ref 12.0–15.0)
MCH: 32.3 pg (ref 26.0–34.0)
MCHC: 32.9 g/dL (ref 30.0–36.0)
MCV: 98.2 fL (ref 80.0–100.0)
Platelets: 314 10*3/uL (ref 150–400)
RBC: 3.37 MIL/uL — ABNORMAL LOW (ref 3.87–5.11)
RDW: 12.6 % (ref 11.5–15.5)
WBC: 13.1 10*3/uL — ABNORMAL HIGH (ref 4.0–10.5)
nRBC: 0.2 % (ref 0.0–0.2)

## 2018-02-03 LAB — COMPREHENSIVE METABOLIC PANEL
ALT: 15 U/L (ref 0–44)
AST: 22 U/L (ref 15–41)
Albumin: 2.7 g/dL — ABNORMAL LOW (ref 3.5–5.0)
Alkaline Phosphatase: 118 U/L (ref 38–126)
Anion gap: 8 (ref 5–15)
BUN: 10 mg/dL (ref 6–20)
CO2: 22 mmol/L (ref 22–32)
CREATININE: 0.48 mg/dL (ref 0.44–1.00)
Calcium: 9 mg/dL (ref 8.9–10.3)
Chloride: 107 mmol/L (ref 98–111)
GFR calc Af Amer: >60 mL/min (ref >60)
GFR calc non Af Amer: >60 mL/min (ref >60)
Glucose, Bld: 91 mg/dL (ref 70–99)
Potassium: 3.7 mmol/L (ref 3.5–5.1)
Sodium: 137 mmol/L (ref 135–145)
Total Bilirubin: 0.4 mg/dL (ref 0.3–1.2)
Total Protein: 6.3 g/dL — ABNORMAL LOW (ref 6.5–8.1)

## 2018-02-03 LAB — URIC ACID: Uric Acid, Serum: 3.8 mg/dL (ref 2.5–7.1)

## 2018-02-03 MED ORDER — LABETALOL HCL 5 MG/ML IV SOLN
20.0000 mg | INTRAVENOUS | Status: DC | PRN
  Administered 2018-02-06: 20 mg via INTRAVENOUS
  Filled 2018-02-03 (×3): qty 4

## 2018-02-03 MED ORDER — HYDRALAZINE HCL 20 MG/ML IJ SOLN: 10.0000 mg | INTRAMUSCULAR | Status: DC | PRN

## 2018-02-03 MED ORDER — LABETALOL HCL 5 MG/ML IV SOLN
80.0000 mg | INTRAVENOUS | Status: DC | PRN
  Filled 2018-02-03: qty 16

## 2018-02-03 MED ORDER — LABETALOL HCL 5 MG/ML IV SOLN
40.0000 mg | INTRAVENOUS | Status: DC | PRN
  Filled 2018-02-03: qty 8

## 2018-02-03 MED ORDER — HYDROXYZINE HCL 25 MG PO TABS
25.0000 mg | ORAL_TABLET | Freq: Every evening | ORAL | Status: DC | PRN
  Filled 2018-02-03 (×2): qty 1

## 2018-02-03 MED ORDER — FAMOTIDINE 20 MG PO TABS
20.0000 mg | ORAL_TABLET | Freq: Every day | ORAL | Status: DC
  Administered 2018-02-03 – 2018-02-07 (×5): 20 mg via ORAL
  Filled 2018-02-03 (×5): qty 1

## 2018-02-03 NOTE — Progress Notes (Signed)
Progress Note:   Notified by nurse that patient is complaining of chest tightness, and that her last several BPs were elevated.  Came to assess patient. Patient complains of chest tightness, worsening over the course of the day. Chest pain is described as having a "T-shaped distribution" and is 8/10. Is also noting upper back pain (4/10). She states that inhaling is actually relieving the tightness.  Denies any stressors or anxiety currently. The feeling occurs at rest or when she is moving. She does note that she can feel some palpitations occasionally. Also noting some mild swelling of her legs. Vitals reviewed below.    Vitals:   02/03/18 2116 02/03/18 2125 02/03/18 2126 02/03/18 2212  BP: (!) 146/103 (!) 148/98 (!) 156/101 (!) 142/101  Pulse: 61 (!) 57 60 (!) 57  Resp:  20  16  Temp:      TempSrc:      SpO2: 100% 100% 100% 99%  Weight:      Height:        Will order pre-eclampsia labs. Discontinued IVF.  EKG performed, normal except mild bradycardia (56 bpm). Exam reveals normal heart and lung sounds, trace non-pitting edema.  Patient appears comfortable at rest, no signs of increased work of breathing, watching TV with her partner.   Instructed on elevating feet, given dose of Pepcid, can have Vistaril and Tylenol this evening to help rest. Discussed possibility of developing pre-eclampsia and explained disease process. If chest pain continues, can consider a dose of a diuretic.  Will start monitoring I/Os. If pre-eclampsia present, will send to L&D for induction as patient is s/p full course of antenatal steroids.    [name], MD [facility]

## 2018-02-03 NOTE — Progress Notes (Addendum)
NT notified RN of elevated BP pressures (143/104, 146/103). RN gave pt her pepcid at 2118 and reassessed BP and found it to be 148/98, pulse 57, 100% oxygen. Pt stated she felt tightness and pressure in her chest, she states this pain is 8/10 . RN auscultated her lungs and found them to be clear. Pt also states new back pain that is 4/10. RN had pt continue to practice her incentive spirometer and elevate her feet and relax in bed to help with her back pain. RN left voicemail for MD at 2130. RN awaiting response
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
## 2018-02-03 NOTE — Progress Notes (Signed)
Pt brought from MB 340 for daily NST.

## 2018-02-04 LAB — PROTEIN / CREATININE RATIO, URINE
Creatinine, Urine: 52 mg/dL
Protein Creatinine Ratio: 0.19 mg/mg{Cre} — ABNORMAL HIGH (ref 0.00–0.15)
Total Protein, Urine: 10 mg/dL

## 2018-02-04 MED ORDER — TERBUTALINE SULFATE 1 MG/ML IJ SOLN
0.2500 mg | Freq: Once | INTRAMUSCULAR | Status: AC
  Administered 2018-02-04: 0.25 mg via SUBCUTANEOUS
  Filled 2018-02-04: qty 1

## 2018-02-04 NOTE — Progress Notes (Signed)
Pt. Presented to L/D from M/B for daily NST. She complains on pelvic cramping, 2/10 and bloody show. Positive fetal movement. Vitals stable. Last BP 145/96. Denies PIH symptoms. Patient stable. Will continue to monitor.

## 2018-02-04 NOTE — Progress Notes (Signed)
Pt here from Mother Baby Unit for daily NST. Cat I FHR tracing. No ctx noted by Toco or palpation. Provider notified and given ok to return pt to Mother Baby Unit.
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
## 2018-02-04 NOTE — Progress Notes (Signed)
Patient complaining of chest heaviness, 7 out of 10, and dull headache 3 out of 10. Patient said her chest feels better when she sits up and her head ache is much better than it was earlier this morning. Pt requested tylenol to help. Tylenol given and encouraged patient to sit up in bed and use incentive spirometer as much as possible. Lung sounds WDL. Patient also states that she has bloody show when she goes to the bathroom. RN instructed patient to go to the restroom so she could see. There was just a scant amount of bloody show on the tissue and in the hat. Patient instructed to inform RN if there is any change in the amount of bloody show. Patient said she doesn't feel tightening of her belly but does have occassional "period cramps"  Patients urine output has been more than adequate (see flow sheet). 11:39 BP 116/104, HR 63. MD notified per order and informed of all things going on. Dr. Amalia Hailey said to follow the preeclamptic focused order set and to let him know if there is any changes. BP recheck at 1205 BP 156/106. Will not treat patient at this time per order set but will keep a close check on patient and BP.    Patients reflexes +1, absence of clonus, denies upper epigastric pain, no swelling, denies visual disturbances.
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
## 2018-02-05 DIAGNOSIS — O1403 Mild to moderate pre-eclampsia, third trimester: Secondary | ICD-10-CM | POA: Diagnosis not present

## 2018-02-05 NOTE — Clinical Social Work Note (Signed)
CSW was able to touch base with patient this morning while father of baby slept in the other bed in the room. Patient was very bright in affect and was amenable to conversation. Patient informed CSW that it is difficult being in the hospital on bed rest but that her significant other and her family are very supportive. Her mother is assisting with her 24 year old while she is a patient. CSW brought up history of anxiety and depression and patient stated that "all of that" was a very long time ago. She states she is great now and has no concerns or issues. CSW encouraged her to call CSW should any needs arise. Shela Leff MSW,LCSW [phone]

## 2018-02-05 NOTE — Progress Notes (Signed)
Patient ID: Michele Bailey, female   DOB: [birth year], 23 y.o.   MRN: [id number]     Subjective:    No complaint today.  Feels much better than yesterday.  No chest heaviness, no H/A, no contractions.  Objective:    NST: yesterday PM - Reactive - no contractions.  Labs: No results found for this or any previous visit (from the past 24 hour(s)).   Assessment:    PPROM - stable - baby reassuring - no contractions - BP's stable  Plan:    CCM  Discussed delivery timing.  Pt considering 34weeks, but we dicussed possibly going beyond that if she desires and remains stable.  R/B discussed questions answered.  Finis Bud, M.D. 02/05/2018 7:11 AM

## 2018-02-06 ENCOUNTER — Inpatient Hospital Stay: Payer: Medicaid Other

## 2018-02-06 LAB — CBC WITH DIFFERENTIAL/PLATELET
Abs Immature Granulocytes: 0.13 10*3/uL — ABNORMAL HIGH (ref 0.00–0.07)
Basophils Absolute: 0 10*3/uL (ref 0.0–0.1)
Basophils Relative: 0 %
Eosinophils Absolute: 0.2 10*3/uL (ref 0.0–0.5)
Eosinophils Relative: 2 %
HCT: 35.8 % — ABNORMAL LOW (ref 36.0–46.0)
Hemoglobin: 12 g/dL (ref 12.0–15.0)
Immature Granulocytes: 1 %
Lymphocytes Relative: 21 %
Lymphs Abs: 2.1 10*3/uL (ref 0.7–4.0)
MCH: 32.7 pg (ref 26.0–34.0)
MCHC: 33.5 g/dL (ref 30.0–36.0)
MCV: 97.5 fL (ref 80.0–100.0)
Monocytes Absolute: 0.6 10*3/uL (ref 0.1–1.0)
Monocytes Relative: 6 %
Neutro Abs: 6.9 10*3/uL (ref 1.7–7.7)
Neutrophils Relative %: 70 %
Platelets: 327 10*3/uL (ref 150–400)
RBC: 3.67 MIL/uL — ABNORMAL LOW (ref 3.87–5.11)
RDW: 12.2 % (ref 11.5–15.5)
WBC: 10 10*3/uL (ref 4.0–10.5)
nRBC: 0.3 % — ABNORMAL HIGH (ref 0.0–0.2)

## 2018-02-06 LAB — PROTEIN / CREATININE RATIO, URINE
Creatinine, Urine: 46 mg/dL
Protein Creatinine Ratio: 0.72 mg/mg{Cre} — ABNORMAL HIGH (ref 0.00–0.15)
Total Protein, Urine: 33 mg/dL
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
## 2018-02-06 NOTE — OB Triage Note (Signed)
Pt brought over from Mother Baby Unit for daily NST. Monitors applied and assessing. Initial FHT 170 will continue to monitor.

## 2018-02-06 NOTE — OB Triage Note (Signed)
Dr. Marcelline Mates notified of Reactive NST. Provider okay to send pt back to Mother Baby Unit. Pt scheduled for IOL Sunday.
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
## 2018-02-07 NOTE — Plan of Care (Signed)
Vs stable except increased BP; no BPs in the "treatable range" this shift                  (no BPs > or = 160/110); no headache this shift; no blurry vision or epigastric pain this shift; a little edema lower extremities; iv antibiotic every 6 hours; up to bathroom only; tolerating regular diet; NST every shift

## 2018-02-08 ENCOUNTER — Inpatient Hospital Stay: Payer: Medicaid Other | Admitting: Anesthesiology

## 2018-02-08 DIAGNOSIS — Z3A34 34 weeks gestation of pregnancy: Secondary | ICD-10-CM

## 2018-02-08 DIAGNOSIS — O1403 Mild to moderate pre-eclampsia, third trimester: Secondary | ICD-10-CM

## 2018-02-08 DIAGNOSIS — O42113 Preterm premature rupture of membranes, onset of labor more than 24 hours following rupture, third trimester: Secondary | ICD-10-CM

## 2018-02-08 LAB — TYPE AND SCREEN
ABO/RH(D): B POS
Antibody Screen: NEGATIVE

## 2018-02-08 LAB — CBC
HCT: 34.9 % — ABNORMAL LOW (ref 36.0–46.0)
HEMOGLOBIN: 11.9 g/dL — AB (ref 12.0–15.0)
MCH: 32.2 pg (ref 26.0–34.0)
MCHC: 34.1 g/dL (ref 30.0–36.0)
MCV: 94.3 fL (ref 80.0–100.0)
Platelets: 336 10*3/uL (ref 150–400)
RBC: 3.7 MIL/uL — ABNORMAL LOW (ref 3.87–5.11)
RDW: 12.4 % (ref 11.5–15.5)
WBC: 11.3 10*3/uL — ABNORMAL HIGH (ref 4.0–10.5)
nRBC: 0 % (ref 0.0–0.2)

## 2018-02-08 LAB — CREATININE, SERUM
Creatinine, Ser: 0.5 mg/dL (ref 0.44–1.00)
GFR calc Af Amer: >60 mL/min (ref >60)
GFR calc non Af Amer: >60 mL/min (ref >60)

## 2018-02-08 LAB — RAPID HIV SCREEN (HIV 1/2 AB+AG)
HIV 1/2 Antibodies: NONREACTIVE
HIV-1 P24 Antigen - HIV24: NONREACTIVE

## 2018-02-08 MED ORDER — OXYTOCIN 40 UNITS IN NORMAL SALINE INFUSION - SIMPLE MED
1.0000 m[IU]/min | INTRAVENOUS | Status: DC
  Administered 2018-02-08: 1 m[IU]/min via INTRAVENOUS

## 2018-02-08 MED ORDER — BENZOCAINE-MENTHOL 20-0.5 % EX AERO: 1.0000 "application " | INHALATION_SPRAY | CUTANEOUS | Status: DC | PRN

## 2018-02-08 MED ORDER — COCONUT OIL OIL
1.0000 "application " | TOPICAL_OIL | Status: DC | PRN
  Filled 2018-02-08: qty 120

## 2018-02-08 MED ORDER — MISOPROSTOL 200 MCG PO TABS
ORAL_TABLET | ORAL | Status: AC
  Administered 2018-02-08: 50 ug via VAGINAL
  Filled 2018-02-08: qty 4

## 2018-02-08 MED ORDER — DIPHENHYDRAMINE HCL 50 MG/ML IJ SOLN: 12.5000 mg | INTRAMUSCULAR | Status: DC | PRN

## 2018-02-08 MED ORDER — TERBUTALINE SULFATE 1 MG/ML IJ SOLN: 0.2500 mg | Freq: Once | INTRAMUSCULAR | Status: DC | PRN

## 2018-02-08 MED ORDER — LIDOCAINE HCL (PF) 1 % IJ SOLN
INTRAMUSCULAR | Status: DC | PRN
  Administered 2018-02-08: 1 mL

## 2018-02-08 MED ORDER — DIPHENHYDRAMINE HCL 25 MG PO CAPS: 25.0000 mg | ORAL_CAPSULE | Freq: Four times a day (QID) | ORAL | Status: DC | PRN

## 2018-02-08 MED ORDER — MISOPROSTOL 25 MCG QUARTER TABLET
50.0000 ug | ORAL_TABLET | ORAL | Status: DC | PRN
  Administered 2018-02-08: 50 ug via ORAL

## 2018-02-08 MED ORDER — LACTATED RINGERS IV SOLN
INTRAVENOUS | Status: DC
  Administered 2018-02-08: 07:00:00 via INTRAVENOUS

## 2018-02-08 MED ORDER — ONDANSETRON HCL 4 MG PO TABS: 4.0000 mg | ORAL_TABLET | ORAL | Status: DC | PRN

## 2018-02-08 MED ORDER — BUTORPHANOL TARTRATE 1 MG/ML IJ SOLN: 1.0000 mg | INTRAMUSCULAR | Status: DC | PRN

## 2018-02-08 MED ORDER — MISOPROSTOL 50MCG HALF TABLET
50.0000 ug | ORAL_TABLET | ORAL | Status: DC | PRN
  Administered 2018-02-08: 50 ug via VAGINAL
  Filled 2018-02-08 (×2): qty 1

## 2018-02-08 MED ORDER — OXYTOCIN 40 UNITS IN NORMAL SALINE INFUSION - SIMPLE MED
2.5000 [IU]/h | INTRAVENOUS | Status: DC
  Filled 2018-02-08: qty 1000

## 2018-02-08 MED ORDER — LACTATED RINGERS IV SOLN: 500.0000 mL | INTRAVENOUS | Status: DC | PRN

## 2018-02-08 MED ORDER — SOD CITRATE-CITRIC ACID 500-334 MG/5ML PO SOLN: 30.0000 mL | ORAL | Status: DC | PRN

## 2018-02-08 MED ORDER — LIDOCAINE HCL (PF) 1 % IJ SOLN: 30.0000 mL | INTRAMUSCULAR | Status: DC | PRN

## 2018-02-08 MED ORDER — ACETAMINOPHEN 325 MG PO TABS
650.0000 mg | ORAL_TABLET | ORAL | Status: DC | PRN
  Administered 2018-02-09 (×2): 650 mg via ORAL
  Filled 2018-02-08 (×2): qty 2

## 2018-02-08 MED ORDER — DIBUCAINE 1 % RE OINT: 1.0000 "application " | TOPICAL_OINTMENT | RECTAL | Status: DC | PRN

## 2018-02-08 MED ORDER — ONDANSETRON HCL 4 MG/2ML IJ SOLN: 4.0000 mg | Freq: Four times a day (QID) | INTRAMUSCULAR | Status: DC | PRN

## 2018-02-08 MED ORDER — ZOLPIDEM TARTRATE 5 MG PO TABS: 5.0000 mg | ORAL_TABLET | Freq: Every evening | ORAL | Status: DC | PRN

## 2018-02-08 MED ORDER — EPHEDRINE 5 MG/ML INJ
10.0000 mg | INTRAVENOUS | Status: DC | PRN
  Filled 2018-02-08: qty 2

## 2018-02-08 MED ORDER — SIMETHICONE 80 MG PO CHEW: 80.0000 mg | CHEWABLE_TABLET | ORAL | Status: DC | PRN

## 2018-02-08 MED ORDER — PHENYLEPHRINE 40 MCG/ML (10ML) SYRINGE FOR IV PUSH (FOR BLOOD PRESSURE SUPPORT)
80.0000 ug | PREFILLED_SYRINGE | INTRAVENOUS | Status: DC | PRN
  Filled 2018-02-08: qty 10

## 2018-02-08 MED ORDER — SODIUM CHLORIDE 0.9 % IV SOLN
INTRAVENOUS | Status: DC | PRN
  Administered 2018-02-08 (×3): 5 mL via EPIDURAL

## 2018-02-08 MED ORDER — OXYCODONE-ACETAMINOPHEN 5-325 MG PO TABS: 2.0000 | ORAL_TABLET | ORAL | Status: DC | PRN

## 2018-02-08 MED ORDER — IBUPROFEN 800 MG PO TABS
800.0000 mg | ORAL_TABLET | Freq: Four times a day (QID) | ORAL | Status: DC
  Administered 2018-02-08 – 2018-02-10 (×7): 800 mg via ORAL
  Filled 2018-02-08 (×7): qty 1

## 2018-02-08 MED ORDER — FENTANYL 2.5 MCG/ML W/ROPIVACAINE 0.15% IN NS 100 ML EPIDURAL (ARMC)
EPIDURAL | Status: AC
  Filled 2018-02-08: qty 100

## 2018-02-08 MED ORDER — SENNOSIDES-DOCUSATE SODIUM 8.6-50 MG PO TABS
2.0000 | ORAL_TABLET | ORAL | Status: DC
  Administered 2018-02-09 (×2): 2 via ORAL
  Filled 2018-02-08 (×2): qty 2

## 2018-02-08 MED ORDER — LACTATED RINGERS IV SOLN: 500.0000 mL | Freq: Once | INTRAVENOUS | Status: DC

## 2018-02-08 MED ORDER — LIDOCAINE HCL (PF) 1 % IJ SOLN
INTRAMUSCULAR | Status: AC
  Filled 2018-02-08: qty 30

## 2018-02-08 MED ORDER — OXYCODONE-ACETAMINOPHEN 5-325 MG PO TABS: 1.0000 | ORAL_TABLET | ORAL | Status: DC | PRN

## 2018-02-08 MED ORDER — OXYTOCIN 10 UNIT/ML IJ SOLN
INTRAMUSCULAR | Status: AC
  Filled 2018-02-08: qty 2

## 2018-02-08 MED ORDER — WITCH HAZEL-GLYCERIN EX PADS: 1.0000 "application " | MEDICATED_PAD | CUTANEOUS | Status: DC | PRN

## 2018-02-08 MED ORDER — ACETAMINOPHEN 325 MG PO TABS: 650.0000 mg | ORAL_TABLET | ORAL | Status: DC | PRN

## 2018-02-08 MED ORDER — ONDANSETRON HCL 4 MG/2ML IJ SOLN: 4.0000 mg | INTRAMUSCULAR | Status: DC | PRN

## 2018-02-08 MED ORDER — FERROUS SULFATE 325 (65 FE) MG PO TABS
325.0000 mg | ORAL_TABLET | Freq: Two times a day (BID) | ORAL | Status: DC
  Administered 2018-02-09 – 2018-02-10 (×3): 325 mg via ORAL
  Filled 2018-02-08 (×3): qty 1

## 2018-02-08 MED ORDER — AMMONIA AROMATIC IN INHA
RESPIRATORY_TRACT | Status: AC
  Filled 2018-02-08: qty 10

## 2018-02-08 MED ORDER — OXYTOCIN BOLUS FROM INFUSION
500.0000 mL | Freq: Once | INTRAVENOUS | Status: AC
  Administered 2018-02-08: 500 mL via INTRAVENOUS

## 2018-02-08 MED ORDER — PRENATAL MULTIVITAMIN CH
1.0000 | ORAL_TABLET | Freq: Every day | ORAL | Status: DC
  Administered 2018-02-09 – 2018-02-10 (×2): 1 via ORAL
  Filled 2018-02-08 (×2): qty 1

## 2018-02-08 MED ORDER — FENTANYL 2.5 MCG/ML W/ROPIVACAINE 0.15% IN NS 100 ML EPIDURAL (ARMC)
12.0000 mL/h | EPIDURAL | Status: DC
  Administered 2018-02-08: 12 mL/h via EPIDURAL

## 2018-02-08 NOTE — Anesthesia Procedure Notes (Signed)
Epidural Patient location during procedure: OB Start time: 02/08/2018 5:50 PM End time: 02/08/2018 6:13 PM  Staffing Anesthesiologist: Durenda Hurt, MD Performed: anesthesiologist   Preanesthetic Checklist Completed: patient identified, site marked, surgical consent, pre-op evaluation, timeout performed, IV checked, risks and benefits discussed and monitors and equipment checked  Epidural Patient position: sitting Prep: ChloraPrep Patient monitoring: heart rate, continuous pulse ox and blood pressure Approach: midline Location: L4-L5 Injection technique: LOR saline  Needle:  Needle type: Tuohy  Needle gauge: 18 G Needle length: 9 cm and 9 Needle insertion depth: 6 cm Catheter type: closed end flexible Catheter size: 20 Guage Catheter at skin depth: 10 cm Test dose: negative and Other  Assessment Events: blood not aspirated, injection not painful, no injection resistance, negative IV test and no paresthesia  Additional Notes   Patient tolerated the insertion well without complications.Reason for block:procedure for pain

## 2018-02-08 NOTE — Progress Notes (Addendum)
Intrapartum Progress Note  S: Patient noting strong contractions, just completed epidural placement.   O: Blood pressure (!) 141/107, pulse 82, temperature 97.7 F (36.5 C), temperature source Oral, resp. rate 18, height 5\' 5"  (1.651 m), weight 81.6 kg, last menstrual period 06/15/2017, SpO2 98 %. Gen App: NAD, uncomfortable with contractions Abdomen: soft, gravid FHT: baseline 150 bpm.  Accels present.  Decels absent . Minimal to moderate in degree variability.   Tocometer: uterine irritability with occasional ctx.  Cervix: 6/60-70/0 Extremities: Nontender, no edema.  Pitocin: 4 mIU  Labs:  No new labs  Assessment:  1: SIUP at [redacted]w[redacted]d 2. Prolonged premature rupture of membranes 3. H/o HSV, no current infection 4. GBS negative  Plan:  1. Continue IOL for prolonged PPROM. Continue Pitocin. 2. Continue HSV prophylaxis with Acyclovir. No evidence of active outbreak.  3. Continue to monitor for s/s of infection 4. S/p epidural placement.  5. Anticipate delivery soon.      [name], MD 02/08/2018 6:21 PM

## 2018-02-08 NOTE — Progress Notes (Signed)
Intrapartum Progress Note  S: Patient doing well, no complaints. Denies fevers, chills. Does note more leaking of fluid this morning.   O: Blood pressure 120/84, pulse 77, temperature 98.4 F (36.9 C), temperature source Oral, resp. rate 18, height 5\' 5"  (1.651 m), weight 81.6 kg, last menstrual period 06/15/2017, SpO2 98 %. Gen App: NAD, comfortable Abdomen: soft, gravid FHT: baseline 150 bpm.  Accels present.  Decels absent (however 1 subtle late deceleration noted at 0650 this morning). moderate in degree variability.   Tocometer: uterine irritability with occasional ctx.  Cervix: 3.5/50-60/-2 Extremities: Nontender, no edema.  Pitocin: None  Labs:  Lab Results  Component Value Date   WBC 11.3 (H) 02/08/2018   HGB 11.9 (L) 02/08/2018   HCT 34.9 (L) 02/08/2018   MCV 94.3 02/08/2018   PLT 336 02/08/2018   Lab Results  Component Value Date   ABORH B POS 02/08/2018      Assessment:  1: SIUP at [redacted]w[redacted]d 2. Prolonged premature rupture of membranes 3. H/o HSV, no current infection 4. GBS negative  Plan:  1. Continue IOL for prolonged PPROM.  Is s/p 7 days of antibiotics for latency and full course of antenatal steroids. Just received 2nd dose of Cytotec 2. Continue HSV prophylaxis with Acyclovir. No evidence of active outbreak.  3. Continue to monitor for s/s of infection    [name], MD 02/08/2018 10:33 AM

## 2018-02-08 NOTE — Progress Notes (Signed)
Intrapartum Progress Note  S: Patient notes she is beginning to feel contractions, but still mild  O: Blood pressure (!) 132/92, pulse (!) 103, temperature 97.7 F (36.5 C), temperature source Oral, resp. rate 18, height 5\' 5"  (1.651 m), weight 81.6 kg, last menstrual period 06/15/2017, SpO2 98 %. Gen App: NAD, comfortable Abdomen: soft, gravid FHT: baseline 145 bpm.  Accels present.  Decels absent . Moderate in degree variability.   Tocometer: uterine irritability with occasional ctx.  Cervix: 4/60/-2 Extremities: Nontender, no edema.  Pitocin: None  Labs:  No new labs  Assessment:  1: SIUP at [redacted]w[redacted]d 2. Prolonged premature rupture of membranes 3. H/o HSV, no current infection 4. GBS negative  Plan:  1. Continue IOL for prolonged PPROM.  Is s/p 2 doses of Cytotec.  Will initiate high dose Pitocin regimen  2. Continue HSV prophylaxis with Acyclovir. No evidence of active outbreak.  3. Continue to monitor for s/s of infection 4. Can have epidrural if desired.     [name], MD 02/08/2018 3:36 PM

## 2018-02-08 NOTE — Anesthesia Preprocedure Evaluation (Signed)
Anesthesia Evaluation  Patient identified by MRN, date of birth, ID band Patient awake    Reviewed: Allergy & Precautions, H&P , NPO status , Patient's Chart, lab work & pertinent test results  Airway Mallampati: I  TM Distance: >3 FB     Dental  (+) Teeth Intact   Pulmonary neg pulmonary ROS, former smoker,           Cardiovascular Exercise Tolerance: Good hypertension (gHTN),      Neuro/Psych PSYCHIATRIC DISORDERS Anxiety Depression    GI/Hepatic GERD  ,  Endo/Other    Renal/GU   negative genitourinary   Musculoskeletal   Abdominal   Peds  Hematology negative hematology ROS (+)   Anesthesia Other Findings Past Medical History: 02/04/2015: Acid reflux No date: Acid reflux No date: Anxiety No date: Depression 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  Past Surgical History: No date: INDUCED ABORTION  BMI    Body Mass Index:  29.95 kg/m      Reproductive/Obstetrics (+) Pregnancy                             Anesthesia Physical Anesthesia Plan  ASA: II  Anesthesia Plan: Epidural   Post-op Pain Management:    Induction:   PONV Risk Score and Plan:   Airway Management Planned:   Additional Equipment:   Intra-op Plan:   Post-operative Plan:   Informed Consent: I have reviewed the patients History and Physical, chart, labs and discussed the procedure including the risks, benefits and alternatives for the proposed anesthesia with the patient or authorized representative who has indicated his/her understanding and acceptance.       Plan Discussed with: Anesthesiologist  Anesthesia Plan Comments:         Anesthesia Quick Evaluation

## 2018-02-09 LAB — CBC
HCT: 33.1 % — ABNORMAL LOW (ref 36.0–46.0)
Hemoglobin: 11 g/dL — ABNORMAL LOW (ref 12.0–15.0)
MCH: 32.2 pg (ref 26.0–34.0)
MCHC: 33.2 g/dL (ref 30.0–36.0)
MCV: 96.8 fL (ref 80.0–100.0)
NRBC: 0 % (ref 0.0–0.2)
Platelets: 299 10*3/uL (ref 150–400)
RBC: 3.42 MIL/uL — ABNORMAL LOW (ref 3.87–5.11)
RDW: 12.6 % (ref 11.5–15.5)
WBC: 16 10*3/uL — ABNORMAL HIGH (ref 4.0–10.5)

## 2018-02-09 NOTE — Progress Notes (Signed)
Patient ID: Michele Bailey, female   DOB: 1995/05/19, 23 y.o.   MRN: [id number]  Progress Note - Vaginal Delivery  Michele Bailey is a 23 y.o. [phone] now PP day 1 s/p Vaginal, Spontaneous .   Subjective:  The patient reports no complaints  Objective:  Vital signs in last 24 hours: Temp:  [97.7 F (36.5 C)-98.6 F (37 C)] 98 F (36.7 C) (01/27 0818) Pulse Rate:  [59-103] 88 (01/27 0818) Resp:  [18-20] 20 (01/27 0818) BP: (120-148)/(81-109) 139/91 (01/27 0818) SpO2:  [98 %-100 %] 100 % (01/27 0818)  Physical Exam:  General: alert, cooperative and no distress Lochia: appropriate Uterine Fundus: firm DVT Evaluation: No evidence of DVT seen on physical exam.  Data Review Recent Labs    02/08/18 0632 02/09/18 0412  HGB 11.9* 11.0*  HCT 34.9* 33.1*    Assessment/Plan: Active Problems:   Preterm premature rupture of membranes   Mild pre-eclampsia in third trimester   BP's improving - no sever range pressures Excellent PP recovery.  -- Continue routine PP care.    Finis Bud, M.D. 02/09/2018 8:19 AM

## 2018-02-09 NOTE — Anesthesia Postprocedure Evaluation (Signed)
Anesthesia Post Note  Patient: Michele Bailey  Procedure(s) Performed: AN AD [place]  Patient location during evaluation: Mother Baby Anesthesia Type: Epidural Level of consciousness: awake and alert Pain management: pain level controlled Vital Signs Assessment: post-procedure vital signs reviewed and stable Respiratory status: spontaneous breathing, nonlabored ventilation and respiratory function stable Cardiovascular status: stable Postop Assessment: no headache, no backache and epidural receding Anesthetic complications: no     Last Vitals:  Vitals:   02/09/18 0100 02/09/18 0400  BP: 125/82 (!) 122/91  Pulse: 79 78  Resp: 18 18  Temp: 36.5 C 36.8 C  SpO2: 100% 98%    Last Pain:  Vitals:   02/09/18 0400  TempSrc: Axillary  PainSc:                  Hedda Slade

## 2018-02-09 NOTE — Lactation Note (Signed)
This note was copied from a baby's chart. Mom was going to put baby to breast for 3 pm feeding and lactation was planning to assist/observe.  Mom did not return for feeding.

## 2018-02-09 NOTE — Lactation Note (Signed)
This note was copied from a baby's chart. Lactation Consultation Note  Patient Name: Michele Bailey [id number] Date: 02/09/2018 Reason for consult: Initial assessment;1st time breastfeeding;NICU baby   Maternal Data Has patient been taught Hand Expression?: Yes Does the patient have breastfeeding experience prior to this delivery?: Yes  Feeding    LATCH Score Latch: Grasps breast easily, tongue down, lips flanged, rhythmical sucking.  Audible Swallowing: A few with stimulation  Type of Nipple: Everted at rest and after stimulation  Comfort (Breast/Nipple): Soft / non-tender  Hold (Positioning): Assistance needed to correctly position infant at breast and maintain latch.  LATCH Score: 8  Interventions Interventions: Breast feeding basics reviewed;Assisted with latch;Skin to skin;Position options  Lactation Tools Discussed/Used     Consult Status Consult Status: Follow-up Date: 02/09/18 Follow-up type: In-patient  MOB states that she breastfeed her now 23yo for "a few weeks" but wants to feed pt colostrum especially while he is in SCN. LC went over: hand expression, pumping duration/frequency, establishing a milk supply, latch and positioning, as well as infant feeding cues during one-on-one session.  Baby was able to latch on during his first attempt and nursing without taking very many breaks for approx. 104mins.  Mom's breasts and nipples WDL after feeding and LC will be present for next feeding.   Marnee Spring 02/09/2018, 12:36 PM

## 2018-02-10 LAB — RPR: RPR Ser Ql: NONREACTIVE

## 2018-02-10 NOTE — Discharge Summary (Signed)
                               Discharge Summary  Date of Admission: 02/01/2018  Date of Discharge: 02/10/2018  Admitting Diagnosis: Premature rupture of membrane at [redacted]w[redacted]d   Mode of Delivery: normal spontaneous vaginal delivery                 Discharge Diagnosis: No other diagnosis and Preeclampsia (mild)   Intrapartum Procedures: GBS prophylaxis, pitocin augmentation and steroids, magnesium, antibiotics.   Post partum procedures:   Complications: none                      Discharge Day SOAP Note:  Progress Note - Vaginal Delivery  Michele Bailey is a 23 y.o. [phone] now PP day 2 s/p Vaginal, Spontaneous . Delivery was complicated by preterm labor and mild pre-eclampsia  Subjective  The patient has the following complaints: has no unusual complaints  Pain is controlled with current medications.   Patient is urinating without difficulty.  She is ambulating well.     Objective  Vital signs: BP 113/74 (BP Location: Left Arm)   Pulse 84   Temp 98.2 F (36.8 C) (Oral)   Resp 18   Ht 5\' 5"  (1.651 m)   Wt 81.6 kg   LMP 06/15/2017   SpO2 99%   Breastfeeding Unknown   BMI 29.95 kg/m   Physical Exam: Gen: NAD Fundus Fundal Tone: Firm  Lochia Amount: Scant  Perineum Appearance: Intact     Data Review Labs: CBC Latest Ref Rng & Units 02/09/2018 02/08/2018 02/06/2018  WBC 4.0 - 10.5 K/uL 16.0(H) 11.3(H) 10.0  Hemoglobin 12.0 - 15.0 g/dL 11.0(L) 11.9(L) 12.0  Hematocrit 36.0 - 46.0 % 33.1(L) 34.9(L) 35.8(L)  Platelets 150 - 400 K/uL 299 336 327   B POS  Assessment/Plan  Active Problems:   Preterm premature rupture of membranes   Mild pre-eclampsia in third trimester    Plan for discharge today.   Discharge Instructions: Per After Visit Summary. Activity: Advance as tolerated. Pelvic rest for 6 weeks.  Also refer to After Visit Summary Diet: Regular Medications: Allergies as of 02/10/2018      Reactions   Augmentin [amoxicillin-pot Clavulanate] Nausea  Only   Willing to take med if necessary with nausea med      Medication List    STOP taking these medications   nitrofurantoin (macrocrystal-monohydrate) 100 MG capsule Commonly known as:  MACROBID     TAKE these medications   prenatal multivitamin Tabs tablet Take 1 tablet by mouth daily at 12 noon.      Outpatient follow up:  Follow-up Information    [name], MD Follow up in 1 week(s).   Specialties:  Obstetrics and Gynecology, Radiology Contact information: [address] [place] [id number] [phone]          Postpartum contraception: Will discuss at first office visit post-partum  Discharged Condition: good  Discharged to: home  Newborn Data: Disposition:NICU  Apgars: APGAR (1 MIN): 8   APGAR (5 MINS): 9   APGAR (10 MINS):    Baby Feeding: Bottle and Breast    Finis Bud, M.D. 02/10/2018 12:15 PM

## 2018-02-10 NOTE — Progress Notes (Signed)
Pt discharged, infant in SCN.  Discharge instructions, prescriptions and follow up appointment given to and reviewed with pt. Pt verbalized understanding. Escorted out by auxillary.

## 2018-02-17 ENCOUNTER — Ambulatory Visit (INDEPENDENT_AMBULATORY_CARE_PROVIDER_SITE_OTHER): Payer: Medicaid Other | Admitting: Obstetrics and Gynecology

## 2018-02-17 VITALS — BP 121/85 | HR 92 | Ht 65.0 in | Wt 165.0 lb

## 2018-02-17 DIAGNOSIS — I10 Essential (primary) hypertension: Secondary | ICD-10-CM

## 2018-02-17 NOTE — Progress Notes (Signed)
Pt is here for bp check, was admitted to [facility] for preterm labor, mild pre-eclampsia in third trimester  BP 121/85, P 92  I spoke with Dr. Amalia Hailey about pts bp stated it looked normal and encouraged pt to make 6 week PPV, pt voiced understanding

## 2018-03-24 ENCOUNTER — Encounter: Payer: Medicaid Other | Admitting: Obstetrics and Gynecology

## 2018-08-20 ENCOUNTER — Other Ambulatory Visit: Payer: Self-pay

## 2018-08-20 ENCOUNTER — Encounter: Payer: Self-pay | Admitting: Physician Assistant

## 2018-08-20 ENCOUNTER — Ambulatory Visit (INDEPENDENT_AMBULATORY_CARE_PROVIDER_SITE_OTHER): Payer: Medicaid Other | Admitting: Physician Assistant

## 2018-08-20 VITALS — BP 142/95 | HR 109 | Temp 98.8°F | Resp 16 | Wt 193.0 lb

## 2018-08-20 DIAGNOSIS — F53 Postpartum depression: Secondary | ICD-10-CM

## 2018-08-20 DIAGNOSIS — J3489 Other specified disorders of nose and nasal sinuses: Secondary | ICD-10-CM

## 2018-08-20 DIAGNOSIS — B373 Candidiasis of vulva and vagina: Secondary | ICD-10-CM | POA: Diagnosis not present

## 2018-08-20 DIAGNOSIS — N760 Acute vaginitis: Secondary | ICD-10-CM

## 2018-08-20 DIAGNOSIS — I10 Essential (primary) hypertension: Secondary | ICD-10-CM

## 2018-08-20 DIAGNOSIS — O99345 Other mental disorders complicating the puerperium: Secondary | ICD-10-CM

## 2018-08-20 DIAGNOSIS — B9689 Other specified bacterial agents as the cause of diseases classified elsewhere: Secondary | ICD-10-CM

## 2018-08-20 DIAGNOSIS — E6609 Other obesity due to excess calories: Secondary | ICD-10-CM

## 2018-08-20 DIAGNOSIS — B3731 Acute candidiasis of vulva and vagina: Secondary | ICD-10-CM

## 2018-08-20 DIAGNOSIS — Z6832 Body mass index (BMI) 32.0-32.9, adult: Secondary | ICD-10-CM

## 2018-08-20 MED ORDER — SERTRALINE HCL 50 MG PO TABS: 50.0000 mg | ORAL_TABLET | Freq: Every day | ORAL | 3 refills | Status: DC

## 2018-08-20 MED ORDER — METRONIDAZOLE 500 MG PO TABS: 500.0000 mg | ORAL_TABLET | Freq: Two times a day (BID) | ORAL | 0 refills | Status: DC

## 2018-08-20 MED ORDER — FLUCONAZOLE 150 MG PO TABS: 150.0000 mg | ORAL_TABLET | Freq: Once | ORAL | 0 refills | Status: AC

## 2018-08-20 MED ORDER — TRIAMTERENE-HCTZ 37.5-25 MG PO TABS: 1.0000 | ORAL_TABLET | Freq: Every day | ORAL | 3 refills | Status: DC

## 2018-08-20 MED ORDER — PSEUDOEPHEDRINE-GUAIFENESIN 30-100 MG/5ML PO SYRP: 5.0000 mL | ORAL_SOLUTION | ORAL | 0 refills | Status: DC | PRN

## 2018-08-20 NOTE — Progress Notes (Signed)
Patient: Michele Bailey Female    DOB: 1995/11/01   22 y.o.   MRN: [id number] Visit Date: 08/20/2018  Today's Provider: Mar Daring, PA-C   Chief Complaint  Patient presents with  . Rash   Subjective:     HPI  Patient presents today with multiple complaints. 1) she is having vaginal itching and irritation with a thick, white discharge that has no apparent odor. This has been coming and going for the last few months.  2) She also has complaints of thick postnasal drainage. She reports she is already using an OTC allergy medication and nasal spray. She is requesting something for the mucus and nighttime cough. 3) Has noticed her last few doctors visits her BP has been elevated. No headache, visual changes, SOB, chest pain, palpitations, lower extremity swelling.  4) Having worsening depression. Has noticed since having her kids that her depression has worsened. She has tried to manage on her own but reports feeling more down and anxious recently. She denies any withdrawal or not feeling close to her kids. Having more anxiety than depression per patient.   Allergies  Allergen Reactions  . Augmentin [Amoxicillin-Pot Clavulanate] Nausea Only    Willing to take med if necessary with nausea med     Current Outpatient Medications:  .  Prenatal Vit-Fe Fumarate-FA (PRENATAL MULTIVITAMIN) TABS tablet, Take 1 tablet by mouth daily at 12 noon., Disp: , Rfl:   Review of Systems  Constitutional: Positive for fatigue.  HENT: Positive for congestion and postnasal drip. Negative for sinus pressure, sinus pain, sneezing and sore throat.   Respiratory: Positive for cough. Negative for chest tightness, shortness of breath and wheezing.   Cardiovascular: Negative for chest pain, palpitations and leg swelling.  Gastrointestinal: Negative for abdominal pain, constipation, diarrhea and nausea.  Genitourinary: Positive for vaginal discharge.  Musculoskeletal: Negative for back pain.   Skin: Positive for rash.  Neurological: Negative for dizziness, weakness, light-headedness, numbness and headaches.  Psychiatric/Behavioral: Positive for dysphoric mood. The patient is nervous/anxious.     Social History   Tobacco Use  . Smoking status: Former Smoker    Packs/day: 0.25  . Smokeless tobacco: Never Used  Substance Use Topics  . Alcohol use: No      Objective:   BP (!) 142/95 (BP Location: Right Arm, Patient Position: Sitting, Cuff Size: Large)   Pulse (!) 109   Temp 98.8 F (37.1 C) (Oral)   Resp 16   Wt 193 lb (87.5 kg)   BMI 32.12 kg/m  Vitals:   08/20/18 1451  BP: (!) 142/95  Pulse: (!) 109  Resp: 16  Temp: 98.8 F (37.1 C)  TempSrc: Oral  Weight: 193 lb (87.5 kg)     Physical Exam Vitals signs reviewed.  Constitutional:      General: She is not in acute distress.    Appearance: Normal appearance. She is well-developed. She is obese. She is not ill-appearing or diaphoretic.  HENT:     Head: Normocephalic and atraumatic.     Right Ear: Hearing, tympanic membrane, ear canal and external ear normal.     Left Ear: Hearing, tympanic membrane, ear canal and external ear normal.     Nose:     Right Sinus: Maxillary sinus tenderness and frontal sinus tenderness present.     Left Sinus: Maxillary sinus tenderness and frontal sinus tenderness present.     Mouth/Throat:     Pharynx: Uvula midline. No oropharyngeal exudate.  Neck:  Musculoskeletal: Normal range of motion and neck supple.     Thyroid: No thyromegaly.     Trachea: No tracheal deviation.  Cardiovascular:     Rate and Rhythm: Normal rate and regular rhythm.     Pulses: Normal pulses.     Heart sounds: Normal heart sounds. No murmur. No friction rub. No gallop.   Pulmonary:     Effort: Pulmonary effort is normal. No respiratory distress.     Breath sounds: Normal breath sounds. No stridor. No wheezing or rales.  Abdominal:     General: Abdomen is flat. Bowel sounds are normal. There  is no distension.     Palpations: Abdomen is soft. There is no mass.     Tenderness: There is no abdominal tenderness. There is no guarding.     Hernia: No hernia is present. There is no hernia in the left inguinal area or right inguinal area.  Genitourinary:    General: Normal vulva.     Exam position: Supine.     Pubic Area: No rash.      Tanner stage (genital): 5.     Labia:        Right: No rash, tenderness, lesion or injury.        Left: No rash, tenderness, lesion or injury.      Vagina: Vaginal discharge (thick, white, foul smelling) present. No erythema.     Cervix: Normal.     Uterus: Normal.      Adnexa: Right adnexa normal and left adnexa normal.       Right: No mass or tenderness.         Left: No mass or tenderness.    Lymphadenopathy:     Cervical: No cervical adenopathy.     Lower Body: No right inguinal adenopathy. No left inguinal adenopathy.  Neurological:     Mental Status: She is alert.     No results found for any visits on 08/20/18.     Assessment & Plan    1. BV (bacterial vaginosis) Worsening. Suspect BV. Will treat with metronidazole as below. Call if not improving.  - metroNIDAZOLE (FLAGYL) 500 MG tablet; Take 1 tablet (500 mg total) by mouth 2 (two) times daily.  Dispense: 14 tablet; Refill: 0  2. Vulvovaginitis due to yeast Some mild redness and irritation of the external labia majora fold. Will treat with diflucan as below for possible secondary yeast infection.  - fluconazole (DIFLUCAN) 150 MG tablet; Take 1 tablet (150 mg total) by mouth once for 1 dose.  Dispense: 1 tablet; Refill: 0  3. Essential hypertension Elevated today and has been elevated at home. Will start Maxzide as below. I will see her back in 4-6 weeks for BP recheck and check BMP.  - triamterene-hydrochlorothiazide (MAXZIDE-25) 37.5-25 MG tablet; Take 1 tablet by mouth daily.  Dispense: 30 tablet; Refill: 3  4. Thick nasal mucus Worsening. Using OTC allergy medication and  flonase. Will add Tussin PE as below to help thin mucus and hopefully dry up some.  - pseudoephedrine-guaifenesin (TUSSIN PE) 30-100 MG/5ML SYRP; Take 5 mLs by mouth every 4 (four) hours as needed.  Dispense: 120 mL; Refill: 0  5. Class 1 obesity due to excess calories with serious comorbidity and body mass index (BMI) of 32.0 to 32.9 in adult Counseled patient on healthy lifestyle modifications including dieting and exercise.   6. Postpartum depression Worsening recently. Will start Sertraline as below. I will re-evaluate in 4-6 weeks when she returns for BP check.  -  sertraline (ZOLOFT) 50 MG tablet; Take 1 tablet (50 mg total) by mouth daily.  Dispense: 30 tablet; Refill: 3 d    Mar Daring, PA-C  [facility]

## 2018-08-20 NOTE — Patient Instructions (Signed)
Hydrochlorothiazide, HCTZ; Triamterene tablets or capsules What is this medicine? HYDROCHLOROTHIAZIDE; TRIAMTERENE (hye droe klor oh THYE a zide; trye AM ter een) is a diuretic. It helps you make more urine and lose the extra water from your body. This medicine is used to treat high blood pressure and edema or swelling from excess water. This medicine may be used for other purposes; ask your health care provider or pharmacist if you have questions. COMMON BRAND NAME(S): Dyazide, Maxzide What should I tell my health care provider before I take this medicine? They need to know if you have any of these conditions:  diabetes  immune system problems, like lupus  kidney disease or stones  liver disease  small amount of urine or difficulty passing urine  an unusual or allergic reaction to triamterene, hydrochlorothiazide, sulfa drugs, other medicines, foods, dyes, or preservatives  pregnant or trying to get pregnant  breast-feeding How should I use this medicine? Take this medicine by mouth with a glass of water. Follow the directions on your prescription label. Take your medicine at regular intervals. Do not take it more often than directed. Do not stop taking except on your doctor's advice. Remember that you will need to pass urine frequently after taking this medicine. Do not take your doses at a time of day that will cause you problems. Do not take at bedtime. Talk to your pediatrician regarding the use of this medicine in children. Special care may be needed. Overdosage: If you think you have taken too much of this medicine contact a poison control center or emergency room at once. NOTE: This medicine is only for you. Do not share this medicine with others. What if I miss a dose? If you miss a dose, take it as soon as you can. If it is almost time for your next dose, take only that dose. Do not take double or extra doses. What may interact with this medicine? Do not take this medicine  with any of the following medications:  cidofovir  dofetilide  eplerenone  potassium supplements  tranylcypromine This medicine may also interact with the following medications:  certain medicines for blood pressure, heart disease like benazepril, lisinopril, losartan, valsartan  lithium  medicines for diabetes  medicines that relax muscles for surgery  NSAIDs, medicines for pain and inflammation, like ibuprofen or naproxen  other diuretics  penicillin G potassium This list may not describe all possible interactions. Give your health care provider a list of all the medicines, herbs, non-prescription drugs, or dietary supplements you use. Also tell them if you smoke, drink alcohol, or use illegal drugs. Some items may interact with your medicine. What should I watch for while using this medicine? Visit your doctor or health care professional for regular check ups. You will need lab work done before you start this medicine and regularly while you are taking it. Check your blood pressure regularly. Ask your health care professional what your blood pressure should be, and when you should contact them. This medicine may increase blood sugar. Ask your healthcare provider if changes in diet or medicines are needed if you have diabetes. You may need to be on a special diet while taking this medicine. Ask your doctor. Also, ask how many glasses of fluid you need to drink a day. You must not get dehydrated. You may get drowsy or dizzy. Do not drive, use machinery, or do anything that needs mental alertness until you know how this medicine affects you. Do not stand or sit  up quickly, especially if you are an older patient. This reduces the risk of dizzy or fainting spells. Alcohol may interfere with the effect of this medicine. Avoid or limit alcoholic drinks. This medicine can make you more sensitive to the sun. Keep out of the sun. If you cannot avoid being in the sun, wear protective clothing  and use sunscreen. Do not use sun lamps or tanning beds/booths. What side effects may I notice from receiving this medicine? Side effects that you should report to your doctor or health care professional as soon as possible:  allergic reactions such as skin rash or itching, hives, swelling of the lips, mouth, tongue, or throat  changes in vision  eye pain  fast or irregular heartbeat, chest pain  feeling faint or dizzy  gout attack  muscle pain or cramps  numbness or tingling in hands, feet, or lips  pain or difficulty when passing urine  redness, blistering, peeling or loosening of the skin, including inside the mouth   signs and symptoms of high blood sugar such as being more thirsty or hungry or having to urinate more than normal. You may also feel very tired or have blurry vision.  shortness of breath  unusually weak Side effects that usually do not require medical attention (report to your doctor or health care professional if they continue or are bothersome):  change in sex drive or performance  dry mouth  headache  stomach upset This list may not describe all possible side effects. Call your doctor for medical advice about side effects. You may report side effects to FDA at 1-800-FDA-1088. Where should I keep my medicine? Keep out of the reach of children. Store at room temperature between 15 and 30 degrees C (59 and 86 degrees F). Protect from light. Throw away any unused medicine after the expiration date. NOTE: This sheet is a summary. It may not cover all possible information. If you have questions about this medicine, talk to your doctor, pharmacist, or health care provider.  2020 Elsevier/Gold Standard (2017-10-22 10:16:53)

## 2018-09-10 ENCOUNTER — Ambulatory Visit: Payer: Self-pay | Admitting: Physician Assistant

## 2018-09-10 NOTE — Progress Notes (Deleted)
       Patient: Michele Bailey Female    DOB: 09-Jul-1995   22 y.o.   MRN: [id number] Visit Date: 09/10/2018  Today's Provider: Mar Daring, PA-C   No chief complaint on file.  Subjective:     HPI  Hypertension, follow-up:  BP Readings from Last 3 Encounters:  08/20/18 (!) 142/95  02/17/18 121/85  02/10/18 113/74    She was last seen for hypertension 3 weeks ago.  BP at that visit was 142/95. Management changes since that visit include start [place]. She reports {excellent/good/fair/poor:19665} compliance with treatment. She {ACTION; IS/IS [id number] having side effects. *** She {is/is not:9024} exercising. She {is/is not:9024} adherent to low salt diet.   Outside blood pressures are ***. She is experiencing {Symptoms; cardiac:12860}.  Patient denies {Symptoms; cardiac:12860}.   Cardiovascular risk factors include {cv risk factors:510}.  Use of agents associated with hypertension: {bp agents assoc with hypertension:511::"none"}.     Weight trend: {trend:16658} Wt Readings from Last 3 Encounters:  08/20/18 193 lb (87.5 kg)  02/17/18 165 lb (74.8 kg)  02/01/18 180 lb (81.6 kg)    Current diet: {diet habits:16563}  ------------------------------------------------------------------------  Depression, Follow-up  She  was last seen for this 3 weeks ago. Changes made at last visit include start Sertraline 50 mg tablet.   She reports {excellent/good/fair/poor:19665} compliance with treatment. She {ACTION; IS/IS [id number] having side effects. ***  She reports {DESC; GOOD/FAIR/POOR:18685} tolerance of treatment. Current symptoms include: {Symptoms; depression:1002} She feels she is {improved/worse/unchanged:3041574} since last visit.  ------------------------------------------------------------------------     Allergies  Allergen Reactions  . Augmentin [Amoxicillin-Pot Clavulanate] Nausea Only    Willing to take med if necessary with nausea med      Current Outpatient Medications:  .  metroNIDAZOLE (FLAGYL) 500 MG tablet, Take 1 tablet (500 mg total) by mouth 2 (two) times daily., Disp: 14 tablet, Rfl: 0 .  Prenatal Vit-Fe Fumarate-FA (PRENATAL MULTIVITAMIN) TABS tablet, Take 1 tablet by mouth daily at 12 noon., Disp: , Rfl:  .  pseudoephedrine-guaifenesin (TUSSIN PE) 30-100 MG/5ML SYRP, Take 5 mLs by mouth every 4 (four) hours as needed., Disp: 120 mL, Rfl: 0 .  sertraline (ZOLOFT) 50 MG tablet, Take 1 tablet (50 mg total) by mouth daily., Disp: 30 tablet, Rfl: 3 .  triamterene-hydrochlorothiazide (MAXZIDE-25) 37.5-25 MG tablet, Take 1 tablet by mouth daily., Disp: 30 tablet, Rfl: 3  Review of Systems  Social History   Tobacco Use  . Smoking status: Former Smoker    Packs/day: 0.25  . Smokeless tobacco: Never Used  Substance Use Topics  . Alcohol use: No      Objective:   There were no vitals taken for this visit. There were no vitals filed for this visit.   Physical Exam   No results found for any visits on 09/10/18.     Assessment & Davidson, PA-C  [facility]

## 2018-09-30 ENCOUNTER — Ambulatory Visit: Payer: Self-pay

## 2018-10-15 ENCOUNTER — Telehealth: Payer: Self-pay | Admitting: Physician Assistant

## 2018-10-15 NOTE — Telephone Encounter (Signed)
Yes this is fine.

## 2018-10-15 NOTE — Telephone Encounter (Signed)
Please advise 

## 2018-10-15 NOTE — Telephone Encounter (Signed)
Pt calling about tomorrow's visit.  She is wanting to change the visit to an in-office visit. She states she is not having any other symptoms besides her tongue and throat has thrush like symptoms.  She also wants a depo shot if she can come in.  She is 2 months behind on getting this.  Pt asking for a call back asap.  Thanks, [employer]

## 2018-10-15 NOTE — Progress Notes (Signed)
Patient: Michele Bailey Female    DOB: [birth year]   22 y.o.   MRN: [id number] Visit Date: 10/16/2018  Today's Provider: Mar Daring, PA-C   No chief complaint on file.  Subjective:     HPI   Patient has white coating in her mouth and throat x2 days. Patient states she no another symptoms. Patient states she has been using a mouth wash for sore mouth and coconut oil, which has helped mildly. Denies any inhalers but has had antibiotics recently.   Patient also wants to discuss starting back on depo-provera injections for birth control.  Also having trouble sleeping. Has trouble falling asleep. Has racing thoughts and worry. States it can take 2-3 hours to fall asleep.  Still has cough when she lies down. This also effects her ability to sleep.   Allergies  Allergen Reactions  . Augmentin [Amoxicillin-Pot Clavulanate] Nausea Only    Willing to take med if necessary with nausea med     Current Outpatient Medications:  .  Prenatal Vit-Fe Fumarate-FA (PRENATAL MULTIVITAMIN) TABS tablet, Take 1 tablet by mouth daily at 12 noon., Disp: , Rfl:  .  sertraline (ZOLOFT) 50 MG tablet, Take 1 tablet (50 mg total) by mouth daily., Disp: 30 tablet, Rfl: 3  Current Facility-Administered Medications:  .  medroxyPROGESTERone (DEPO-PROVERA) injection 150 mg, 150 mg, Intramuscular, Once, [employer] Veasey M, PA-C  Review of Systems  Constitutional: Negative for appetite change, chills, fatigue and fever.  Respiratory: Negative for chest tightness and shortness of breath.   Cardiovascular: Negative for chest pain and palpitations.  Gastrointestinal: Negative for abdominal pain, nausea and vomiting.  Neurological: Negative for dizziness and weakness.    Social History   Tobacco Use  . Smoking status: Former Smoker    Packs/day: 0.25  . Smokeless tobacco: Never Used  Substance Use Topics  . Alcohol use: No      Objective:   BP 121/86 (BP Location: Right Arm, Patient  Position: Sitting, Cuff Size: Large)   Pulse 91   Temp (!) 97.3 F (36.3 C) (Other (Comment))   Resp 16   Wt 192 lb (87.1 kg)   SpO2 98%   BMI 32.20 kg/m  Vitals:   10/16/18 1014  BP: 121/86  Pulse: 91  Resp: 16  Temp: (!) 97.3 F (36.3 C)  TempSrc: Other (Comment)  SpO2: 98%  Weight: 192 lb (87.1 kg)  Body mass index is 32.2 kg/m.   Physical Exam Vitals signs reviewed.  Constitutional:      General: She is not in acute distress.    Appearance: Normal appearance. She is well-developed. She is not ill-appearing or diaphoretic.  HENT:     Head: Normocephalic and atraumatic.     Mouth/Throat:     Mouth: Mucous membranes are moist.     Tongue: Lesions (white patchy coating that scrapes off leaving erythematous base) present.  Neck:     Musculoskeletal: Normal range of motion and neck supple.     Thyroid: No thyromegaly.     Vascular: No JVD.     Trachea: No tracheal deviation.  Cardiovascular:     Rate and Rhythm: Normal rate and regular rhythm.     Heart sounds: Normal heart sounds. No murmur. No friction rub. No gallop.   Pulmonary:     Effort: Pulmonary effort is normal. No respiratory distress.     Breath sounds: Normal breath sounds. No wheezing or rales.  Lymphadenopathy:     Cervical:  No cervical adenopathy.  Neurological:     Mental Status: She is alert.  Psychiatric:        Mood and Affect: Mood is anxious.      Results for orders placed or performed in visit on 10/16/18  POCT urine pregnancy  Result Value Ref Range   Preg Test, Ur Negative Negative       Assessment & Plan     1. Encounter for surveillance of injectable contraceptive Urine pregnancy is negative. Depo provera restarted. Return in 3 months for next injection.  - POCT urine pregnancy - medroxyPROGESTERone (DEPO-PROVERA) injection 150 mg  2. GAD (generalized anxiety disorder) Increase sertraline to 100mg . Add clonazepam for prn use at bedtime. Return in 3 months to see how  improving.  - sertraline (ZOLOFT) 100 MG tablet; Take 1 tablet (100 mg total) by mouth daily.  Dispense: 90 tablet; Refill: 3 - clonazePAM (KLONOPIN) 0.5 MG tablet; Take 1 tablet (0.5 mg total) by mouth at bedtime.  Dispense: 90 tablet; Refill: 1  3. Difficulty sleeping See above medical treatment plan. - clonazePAM (KLONOPIN) 0.5 MG tablet; Take 1 tablet (0.5 mg total) by mouth at bedtime.  Dispense: 90 tablet; Refill: 1  4. Post-nasal drainage Has nighttime cough, worse with lying down. Reports drainage and tickle. Will try flonase as below. Advised to use saline nasal rinses and OTC antihistamine (zyrtec, allegra, claritin, xyzal).  - fluticasone (FLONASE) 50 MCG/ACT nasal spray; Place 2 sprays into both nostrils daily.  Dispense: 16 g; Refill: 6  5. Cough For use prn at bedtime.  - guaiFENesin-dextromethorphan (ROBITUSSIN DM) 100-10 MG/5ML syrup; Take 5 mLs by mouth every 4 (four) hours as needed for cough.  Dispense: 118 mL; Refill: 0  6. Gastroesophageal reflux disease without esophagitis Discussed using OTC pepcid prn and starting a probiotic (align recommended).   7. Thrush Noted of the mouth, most likely from metronidazole. Diflucan given as below.  - fluconazole (DIFLUCAN) 150 MG tablet; Take 1 tablet (150 mg total) by mouth once for 1 dose. May repeat in 48-72 hrs if needed  Dispense: 2 tablet; Refill: 0   Mar Daring, PA-C  [facility]

## 2018-10-16 ENCOUNTER — Ambulatory Visit (INDEPENDENT_AMBULATORY_CARE_PROVIDER_SITE_OTHER): Payer: Medicaid Other | Admitting: Physician Assistant

## 2018-10-16 ENCOUNTER — Other Ambulatory Visit: Payer: Self-pay

## 2018-10-16 ENCOUNTER — Encounter: Payer: Self-pay | Admitting: Physician Assistant

## 2018-10-16 VITALS — BP 121/86 | HR 91 | Temp 97.3°F | Resp 16 | Wt 192.0 lb

## 2018-10-16 DIAGNOSIS — R0982 Postnasal drip: Secondary | ICD-10-CM

## 2018-10-16 DIAGNOSIS — G479 Sleep disorder, unspecified: Secondary | ICD-10-CM | POA: Diagnosis not present

## 2018-10-16 DIAGNOSIS — R059 Cough, unspecified: Secondary | ICD-10-CM

## 2018-10-16 DIAGNOSIS — Z30019 Encounter for initial prescription of contraceptives, unspecified: Secondary | ICD-10-CM | POA: Diagnosis not present

## 2018-10-16 DIAGNOSIS — B370 Candidal stomatitis: Secondary | ICD-10-CM

## 2018-10-16 DIAGNOSIS — Z3042 Encounter for surveillance of injectable contraceptive: Secondary | ICD-10-CM | POA: Diagnosis not present

## 2018-10-16 DIAGNOSIS — K219 Gastro-esophageal reflux disease without esophagitis: Secondary | ICD-10-CM

## 2018-10-16 DIAGNOSIS — R05 Cough: Secondary | ICD-10-CM

## 2018-10-16 DIAGNOSIS — F411 Generalized anxiety disorder: Secondary | ICD-10-CM

## 2018-10-16 LAB — POCT URINE PREGNANCY: Preg Test, Ur: NEGATIVE

## 2018-10-16 MED ORDER — SERTRALINE HCL 100 MG PO TABS: 100.0000 mg | ORAL_TABLET | Freq: Every day | ORAL | 3 refills | Status: DC

## 2018-10-16 MED ORDER — MEDROXYPROGESTERONE ACETATE 150 MG/ML IM SUSP: 150.0000 mg | Freq: Once | INTRAMUSCULAR | Status: DC

## 2018-10-16 MED ORDER — MEDROXYPROGESTERONE ACETATE 150 MG/ML IM SUSP: 150.0000 mg | Freq: Once | INTRAMUSCULAR | 0 refills | Status: DC

## 2018-10-16 MED ORDER — FLUTICASONE PROPIONATE 50 MCG/ACT NA SUSP: 2.0000 | Freq: Every day | NASAL | 6 refills | Status: DC

## 2018-10-16 MED ORDER — GUAIFENESIN-DM 100-10 MG/5ML PO SYRP: 5.0000 mL | ORAL_SOLUTION | ORAL | 0 refills | Status: DC | PRN

## 2018-10-16 MED ORDER — CLONAZEPAM 0.5 MG PO TABS: 0.5000 mg | ORAL_TABLET | Freq: Every day | ORAL | 1 refills | Status: DC

## 2018-10-16 MED ORDER — MEDROXYPROGESTERONE ACETATE 150 MG/ML IM SUSP
150.0000 mg | Freq: Once | INTRAMUSCULAR | Status: AC
  Administered 2018-10-16: 150 mg via INTRAMUSCULAR

## 2018-10-16 MED ORDER — FLUCONAZOLE 150 MG PO TABS: 150.0000 mg | ORAL_TABLET | Freq: Once | ORAL | 0 refills | Status: AC

## 2018-10-16 NOTE — Patient Instructions (Signed)
Medroxyprogesterone injection [Contraceptive] What is this medicine? MEDROXYPROGESTERONE (me DROX ee proe JES te rone) contraceptive injections prevent pregnancy. They provide effective birth control for 3 months. Depo-subQ Provera 104 is also used for treating pain related to endometriosis. This medicine may be used for other purposes; ask your health care provider or pharmacist if you have questions. COMMON BRAND NAME(S): Depo-Provera, Depo-subQ Provera 104 What should I tell my health care provider before I take this medicine? They need to know if you have any of these conditions:  frequently drink alcohol  asthma  blood vessel disease or a history of a blood clot in the lungs or legs  bone disease such as osteoporosis  breast cancer  diabetes  eating disorder (anorexia nervosa or bulimia)  high blood pressure  HIV infection or AIDS  kidney disease  liver disease  mental depression  migraine  seizures (convulsions)  stroke  tobacco smoker  vaginal bleeding  an unusual or allergic reaction to medroxyprogesterone, other hormones, medicines, foods, dyes, or preservatives  pregnant or trying to get pregnant  breast-feeding How should I use this medicine? Depo-Provera Contraceptive injection is given into a muscle. Depo-subQ Provera 104 injection is given under the skin. These injections are given by a health care professional. You must not be pregnant before getting an injection. The injection is usually given during the first 5 days after the start of a menstrual period or 6 weeks after delivery of a baby. Talk to your pediatrician regarding the use of this medicine in children. Special care may be needed. These injections have been used in female children who have started having menstrual periods. Overdosage: If you think you have taken too much of this medicine contact a poison control center or emergency room at once. NOTE: This medicine is only for you. Do not  share this medicine with others. What if I miss a dose? Try not to miss a dose. You must get an injection once every 3 months to maintain birth control. If you cannot keep an appointment, call and reschedule it. If you wait longer than 13 weeks between Depo-Provera contraceptive injections or longer than 14 weeks between Depo-subQ Provera 104 injections, you could get pregnant. Use another method for birth control if you miss your appointment. You may also need a pregnancy test before receiving another injection. What may interact with this medicine? Do not take this medicine with any of the following medications:  bosentan This medicine may also interact with the following medications:  aminoglutethimide  antibiotics or medicines for infections, especially rifampin, rifabutin, rifapentine, and griseofulvin  aprepitant  barbiturate medicines such as phenobarbital or primidone  bexarotene  carbamazepine  medicines for seizures like ethotoin, felbamate, oxcarbazepine, phenytoin, topiramate  modafinil  St. John's wort This list may not describe all possible interactions. Give your health care provider a list of all the medicines, herbs, non-prescription drugs, or dietary supplements you use. Also tell them if you smoke, drink alcohol, or use illegal drugs. Some items may interact with your medicine. What should I watch for while using this medicine? This drug does not protect you against HIV infection (AIDS) or other sexually transmitted diseases. Use of this product may cause you to lose calcium from your bones. Loss of calcium may cause weak bones (osteoporosis). Only use this product for more than 2 years if other forms of birth control are not right for you. The longer you use this product for birth control the more likely you will be at risk   for weak bones. Ask your health care professional how you can keep strong bones. You may have a change in bleeding pattern or irregular periods.  Many females stop having periods while taking this drug. If you have received your injections on time, your chance of being pregnant is very low. If you think you may be pregnant, see your health care professional as soon as possible. Tell your health care professional if you want to get pregnant within the next year. The effect of this medicine may last a long time after you get your last injection. What side effects may I notice from receiving this medicine? Side effects that you should report to your doctor or health care professional as soon as possible:  allergic reactions like skin rash, itching or hives, swelling of the face, lips, or tongue  breast tenderness or discharge  breathing problems  changes in vision  depression  feeling faint or lightheaded, falls  fever  pain in the abdomen, chest, groin, or leg  problems with balance, talking, walking  unusually weak or tired  yellowing of the eyes or skin Side effects that usually do not require medical attention (report to your doctor or health care professional if they continue or are bothersome):  acne  fluid retention and swelling  headache  irregular periods, spotting, or absent periods  temporary pain, itching, or skin reaction at site where injected  weight gain This list may not describe all possible side effects. Call your doctor for medical advice about side effects. You may report side effects to FDA at 1-800-FDA-1088. Where should I keep my medicine? This does not apply. The injection will be given to you by a health care professional. NOTE: This sheet is a summary. It may not cover all possible information. If you have questions about this medicine, talk to your doctor, pharmacist, or health care provider.  2020 Elsevier/Gold Standard (2008-01-22 18:37:56)  

## 2018-10-20 ENCOUNTER — Other Ambulatory Visit: Payer: Self-pay

## 2018-10-20 DIAGNOSIS — Z20822 Contact with and (suspected) exposure to covid-19: Secondary | ICD-10-CM

## 2018-10-22 ENCOUNTER — Telehealth: Payer: Self-pay | Admitting: General Practice

## 2018-10-22 LAB — NOVEL CORONAVIRUS, NAA: SARS-CoV-2, NAA: NOT DETECTED

## 2018-10-22 NOTE — Telephone Encounter (Signed)
Pt called in for covid result.  °Advised of Not Detected result.  °

## 2018-11-25 NOTE — Progress Notes (Signed)
Patient: Michele Bailey Female    DOB: 1995-08-17   22 y.o.   MRN: [id number] Visit Date: 11/26/2018  Today's Provider: Mar Daring, PA-C   Chief Complaint  Patient presents with  . Breast Problem   Subjective:     HPI  Breast Lump: Patient presents for evaluation of a breast Lump of right breast. Change was noted 3 years ago. Reports it has been present since the birth of her child. Patient does routinely do self breast exams.  Patient has noted a change on breast exam. Breast cancer risk factors include none.   Age of menarche was 24.   Last menstrual period was 2 months ago (currently on Depo).  Age of first live birth was 23 years old. Patient did breast feed. Patient denies nipple discharge. Patient denies to previous breast biopsy. Patient denies a personal history of breast cancer.   Allergies  Allergen Reactions  . Augmentin [Amoxicillin-Pot Clavulanate] Nausea Only    Willing to take med if necessary with nausea med     Current Outpatient Medications:  .  clonazePAM (KLONOPIN) 0.5 MG tablet, Take 1 tablet (0.5 mg total) by mouth at bedtime., Disp: 90 tablet, Rfl: 1 .  fluticasone (FLONASE) 50 MCG/ACT nasal spray, Place 2 sprays into both nostrils daily., Disp: 16 g, Rfl: 6 .  guaiFENesin-dextromethorphan (ROBITUSSIN DM) 100-10 MG/5ML syrup, Take 5 mLs by mouth every 4 (four) hours as needed for cough., Disp: 118 mL, Rfl: 0 .  Prenatal Vit-Fe Fumarate-FA (PRENATAL MULTIVITAMIN) TABS tablet, Take 1 tablet by mouth daily at 12 noon., Disp: , Rfl:  .  sertraline (ZOLOFT) 100 MG tablet, Take 1 tablet (100 mg total) by mouth daily., Disp: 90 tablet, Rfl: 3  Review of Systems  Constitutional: Negative for appetite change, chills, fatigue and fever.  Respiratory: Negative for chest tightness and shortness of breath.   Cardiovascular: Negative for chest pain and palpitations.  Gastrointestinal: Negative for abdominal pain, nausea and vomiting.  Neurological:  Negative for dizziness and weakness.    Social History   Tobacco Use  . Smoking status: Former Smoker    Packs/day: 0.25  . Smokeless tobacco: Never Used  Substance Use Topics  . Alcohol use: No      Objective:   BP 114/68 (BP Location: Left Arm, Patient Position: Sitting, Cuff Size: Large)   Pulse 97   Temp (!) 96.6 F (35.9 C) (Temporal)   Resp 16   Wt 193 lb (87.5 kg)   SpO2 99% Comment: room air  BMI 32.37 kg/m  Vitals:   11/26/18 1503  BP: 114/68  Pulse: 97  Resp: 16  Temp: (!) 96.6 F (35.9 C)  TempSrc: Temporal  SpO2: 99%  Weight: 193 lb (87.5 kg)  Body mass index is 32.37 kg/m.   Physical Exam Vitals signs reviewed.  Constitutional:      General: She is not in acute distress.    Appearance: Normal appearance. She is well-developed. She is obese. She is not ill-appearing.  HENT:     Head: Normocephalic and atraumatic.  Neck:     Musculoskeletal: Normal range of motion and neck supple.  Pulmonary:     Effort: Pulmonary effort is normal. No respiratory distress.  Chest:     Breasts:        Right: Tenderness present. No inverted nipple, nipple discharge or skin change.        Left: Normal. No inverted nipple, mass, nipple discharge, skin change or  tenderness.    Lymphadenopathy:     Upper Body:     Right upper body: No supraclavicular, axillary or pectoral adenopathy.     Left upper body: No supraclavicular, axillary or pectoral adenopathy.  Neurological:     Mental Status: She is alert.  Psychiatric:        Mood and Affect: Mood normal.        Behavior: Behavior normal.        Thought Content: Thought content normal.        Judgment: Judgment normal.      No results found for any visits on 11/26/18.     Assessment & Plan    1. Breast tenderness Will get [place] as below to r/o any worrisome mass, even though not highly likely. Advised patient to use warm compresses over the area. Kelfex will be given in case it is a blocked duct. I will f/u  pending results.  - cephALEXin (KEFLEX) 500 MG capsule; Take 1 capsule (500 mg total) by mouth 2 (two) times daily.  Dispense: 14 capsule; Refill: 0 - US BREAST LTD UNI RIGHT INC AXILLA; Future  2. Seasonal allergic rhinitis due to pollen Still complains of waking up with congestion and cough despite using flonase and zyrtec. Will add Singulair as below. Call if not improving or worsening.  - montelukast (SINGULAIR) 10 MG tablet; Take 1 tablet (10 mg total) by mouth at bedtime.  Dispense: 30 tablet; Refill: [place], PA-C  [facility]

## 2018-11-26 ENCOUNTER — Encounter: Payer: Self-pay | Admitting: Physician Assistant

## 2018-11-26 ENCOUNTER — Ambulatory Visit (INDEPENDENT_AMBULATORY_CARE_PROVIDER_SITE_OTHER): Payer: Medicaid Other | Admitting: Physician Assistant

## 2018-11-26 ENCOUNTER — Other Ambulatory Visit: Payer: Self-pay

## 2018-11-26 VITALS — BP 114/68 | HR 97 | Temp 96.6°F | Resp 16 | Wt 193.0 lb

## 2018-11-26 DIAGNOSIS — N644 Mastodynia: Secondary | ICD-10-CM

## 2018-11-26 DIAGNOSIS — J301 Allergic rhinitis due to pollen: Secondary | ICD-10-CM | POA: Diagnosis not present

## 2018-11-26 MED ORDER — MONTELUKAST SODIUM 10 MG PO TABS: 10.0000 mg | ORAL_TABLET | Freq: Every day | ORAL | 3 refills | Status: DC

## 2018-11-26 MED ORDER — CEPHALEXIN 500 MG PO CAPS: 500.0000 mg | ORAL_CAPSULE | Freq: Two times a day (BID) | ORAL | 0 refills | Status: DC

## 2018-12-04 ENCOUNTER — Other Ambulatory Visit (HOSPITAL_COMMUNITY)
Admission: RE | Admit: 2018-12-04 | Discharge: 2018-12-04 | Disposition: A | Discharge status: Home / Self Care | Payer: Medicaid Other | Source: Ambulatory Visit | Attending: Family Medicine | Admitting: Family Medicine

## 2018-12-04 ENCOUNTER — Other Ambulatory Visit: Payer: Self-pay

## 2018-12-04 ENCOUNTER — Ambulatory Visit: Payer: Medicaid Other | Admitting: Family Medicine

## 2018-12-04 ENCOUNTER — Ambulatory Visit (INDEPENDENT_AMBULATORY_CARE_PROVIDER_SITE_OTHER): Payer: Medicaid Other | Admitting: Family Medicine

## 2018-12-04 ENCOUNTER — Encounter: Payer: Self-pay | Admitting: Family Medicine

## 2018-12-04 VITALS — BP 125/88 | HR 89 | Temp 97.1°F | Wt 190.0 lb

## 2018-12-04 DIAGNOSIS — B009 Herpesviral infection, unspecified: Secondary | ICD-10-CM

## 2018-12-04 DIAGNOSIS — N898 Other specified noninflammatory disorders of vagina: Secondary | ICD-10-CM | POA: Diagnosis present

## 2018-12-04 MED ORDER — VALACYCLOVIR HCL 1 G PO TABS: 1000.0000 mg | ORAL_TABLET | Freq: Every day | ORAL | 0 refills | Status: DC

## 2018-12-04 NOTE — Patient Instructions (Signed)
Herpes Simplex Test  Why am I having this test?  The herpes simplex test is used to check for an infection with the herpes simplex virus (HSV). There are two common types of HSV:   Type 1 (HSV1) often causes cold sores on or around the mouth and sometimes on or around the eyes.   Type 2 (HSV2) is a sexually transmitted infection that causes sores in and around the genitals.  You may need to have an HSV test if:   Your health care provider thinks that you may have an HSV infection.   You have a weakened body defense system (immune system) and you have sores around your mouth or genitals that look like HSV eruptions.   You have sex with multiple partners, or your partner has genital herpes.   You are pregnant, have herpes, and are expecting to deliver a baby vaginally in the next 6-8 weeks.  What is being tested?  There are two types of herpes simplex tests:   Blood test. This test checks the sample for:  ? HSV antibodies. Antibodies are proteins that your body makes to help fight infection. This test checks whether antibodies against HSV are in your blood.  ? HSV antigens. This checks for the presence of the HSV virus (antigen) in your blood.   Culture test. This test checks for the virus in a sample of fluid from an open sore. Culture tests take several days to complete but are very accurate.  What kind of sample is taken?         Samples will be collected according to the type of tests your health care provider orders.   For the blood tests, a blood sample is usually collected by inserting a needle into a blood vessel.   For a culture test, the sample is usually collected by swabbing the fluid that is coming from an open sore during an active infection (outbreak).  How are the results reported?  Your test results will be reported as either positive or negative. For this test, normal results are:   Negative for HSV virus or antibodies in your blood.   Negative for HSV virus in cultured fluid.  Sometimes,  the test results may report that a condition is present when it is not present (false-positive result).  Sometimes, the test results may report that a condition is not present when it is present (false-negative result).  What do the results mean?   A positive result may indicate that you have an active HSV infection. The presence of HSV1 or HSV2 antigens or antibodies in your blood may indicate an active HSV infection.   A negative result means that you do not have HSV1 or HSV2 virus in your blood. This may mean that you do not have HSV infection.  Talk with your health care provider about what your results mean.  Questions to ask your health care provider  Ask your health care provider, or the department that is doing the test:   When will my results be ready?   How will I get my results?   What are my treatment options?   What other tests do I need?   What are my next steps?  Summary   You may have this test if your health care provider suspects that you have a herpes simplex virus (HSV) infection.   Type 1 (HSV1) often causes cold sores on or around the mouth and sometimes on or around the eyes. Type 2 (HSV2)   active HSV infection. A negative result means that you probably do not have an active infection. Talk with your health care provider about what your results mean. This information is not intended to replace advice given to you by your health care provider. Make sure you discuss any questions you have with your health care provider. Document Released: 02/03/2004 Document Revised: 12/13/2016 Document Reviewed: 11/19/2016 Elsevier Patient Education  2020 Elsevier Inc.  

## 2018-12-04 NOTE — Progress Notes (Signed)
H      Patient: Michele Bailey Female    DOB: 16-Jul-1995   23 y.o.   MRN: [id number] Visit Date: 12/04/2018  Today's Provider: Lavon Paganini, MD   No chief complaint on file.  Subjective:     Vaginal Discharge The patient's primary symptoms include vaginal discharge. The patient's pertinent negatives include no pelvic pain. This is a recurrent problem. The current episode started in the past 7 days. The problem has been gradually worsening. Pertinent negatives include no dysuria, flank pain, frequency, hematuria or urgency. The vaginal discharge was white. There has been no bleeding. Treatments tried: Nystatin cream.    She has noticed external bumps that are painful when she urinates.  She is worried about herpes.  She states she had a similar issue about 5 years ago and was told that she had herpes, but no test was done at that time.  She did take prophylactic acyclovir at the end of her pregnancy before delivery.  She only has 1 female sexual partner.  He has not had any rash, but he does get cold sores intermittently.  She has a history of recurrent BV.  Allergies  Allergen Reactions  . Augmentin [Amoxicillin-Pot Clavulanate] Nausea Only    Willing to take med if necessary with nausea med     Current Outpatient Medications:  .  cephALEXin (KEFLEX) 500 MG capsule, Take 1 capsule (500 mg total) by mouth 2 (two) times daily., Disp: 14 capsule, Rfl: 0 .  clonazePAM (KLONOPIN) 0.5 MG tablet, Take 1 tablet (0.5 mg total) by mouth at bedtime., Disp: 90 tablet, Rfl: 1 .  fluticasone (FLONASE) 50 MCG/ACT nasal spray, Place 2 sprays into both nostrils daily., Disp: 16 g, Rfl: 6 .  montelukast (SINGULAIR) 10 MG tablet, Take 1 tablet (10 mg total) by mouth at bedtime., Disp: 30 tablet, Rfl: 3 .  Prenatal Vit-Fe Fumarate-FA (PRENATAL MULTIVITAMIN) TABS tablet, Take 1 tablet by mouth daily at 12 noon., Disp: , Rfl:  .  sertraline (ZOLOFT) 100 MG tablet, Take 1 tablet (100 mg total) by mouth  daily., Disp: 90 tablet, Rfl: 3 .  guaiFENesin-dextromethorphan (ROBITUSSIN DM) 100-10 MG/5ML syrup, Take 5 mLs by mouth every 4 (four) hours as needed for cough., Disp: 118 mL, Rfl: 0  Review of Systems  Constitutional: Negative.   Respiratory: Negative.   Cardiovascular: Negative.   Gastrointestinal: Negative.   Genitourinary: Positive for vaginal discharge. Negative for decreased urine volume, difficulty urinating, dyspareunia, dysuria, enuresis, flank pain, frequency, genital sores, hematuria, menstrual problem, pelvic pain, urgency, vaginal bleeding and vaginal pain.  Neurological: Negative.   Psychiatric/Behavioral: Negative.     Social History   Tobacco Use  . Smoking status: Former Smoker    Packs/day: 0.25  . Smokeless tobacco: Never Used  Substance Use Topics  . Alcohol use: No      Objective:   BP 125/88 (BP Location: Left Arm, Patient Position: Sitting, Cuff Size: Large)   Pulse 89   Temp (!) 97.1 F (36.2 C) (Temporal)   Wt 190 lb (86.2 kg)   BMI 31.86 kg/m  Vitals:   12/04/18 1130  BP: 125/88  Pulse: 89  Temp: (!) 97.1 F (36.2 C)  TempSrc: Temporal  Weight: 190 lb (86.2 kg)  Body mass index is 31.86 kg/m.   Physical Exam Vitals signs reviewed.  Constitutional:      General: She is not in acute distress.    Appearance: She is well-developed.  HENT:     Head: Normocephalic  and atraumatic.  Eyes:     General: No scleral icterus.    Conjunctiva/sclera: Conjunctivae normal.  Cardiovascular:     Rate and Rhythm: Normal rate and regular rhythm.  Pulmonary:     Effort: Pulmonary effort is normal. No respiratory distress.  Genitourinary:    Comments: GYN:  External genitalia with scattered vesicular rash on erythematous base over labia minora and labia majora as well as perineum.  Vaginal mucosa pink, moist, normal rugae.  Nonfriable cervix without lesions, + discharge, no bleeding noted on speculum exam.     Skin:    General: Skin is warm and dry.      Capillary Refill: Capillary refill takes less than 2 seconds.     Findings: No rash.  Neurological:     Mental Status: She is alert and oriented to person, place, and time.  Psychiatric:        Behavior: Behavior normal.      No results found for any visits on 12/04/18.     Assessment & Plan   1. Vaginal discharge -Patient has a recurrent issue with bacterial vaginosis -Swab was collected today and we will test for BV, yeast, gonorrhea, chlamydia, trichomonas -Discussed avoiding unprotected sex while results are pending -Treatment pending results - Cervicovaginal ancillary only  2. Herpes -Patient with recurrent episodes of a similar rash that appears to be herpetic in nature -Swab taken today for culture -Avoid unprotected sex -Treat with Valtrex -Discussed return precautions - Herpes simplex virus culture   Meds ordered this encounter  Medications  . valACYclovir (VALTREX) 1000 MG tablet    Sig: Take 1 tablet (1,000 mg total) by mouth daily.    Dispense:  5 tablet    Refill:  0     Return if symptoms worsen or fail to improve.   The entirety of the information documented in the History of Present Illness, Review of Systems and Physical Exam were personally obtained by me. Portions of this information were initially documented by Ashley Royalty, CMA and reviewed by me for thoroughness and accuracy.    , Dionne Bucy, MD MPH [facility]

## 2018-12-07 ENCOUNTER — Other Ambulatory Visit: Payer: Self-pay | Admitting: Physician Assistant

## 2018-12-07 ENCOUNTER — Telehealth: Payer: Self-pay

## 2018-12-07 LAB — SPECIMEN STATUS REPORT

## 2018-12-07 LAB — CERVICOVAGINAL ANCILLARY ONLY
Bacterial Vaginitis (gardnerella): POSITIVE — AB
Candida Glabrata: NEGATIVE
Candida Vaginitis: POSITIVE — AB
Chlamydia: NEGATIVE
Comment: NEGATIVE
Comment: NEGATIVE
Comment: NEGATIVE
Comment: NEGATIVE
Comment: NEGATIVE
Comment: NORMAL
Neisseria Gonorrhea: NEGATIVE
Trichomonas: NEGATIVE

## 2018-12-07 LAB — HERPES SIMPLEX VIRUS CULTURE

## 2018-12-07 NOTE — Telephone Encounter (Signed)
Pt also requested a prescription for Valtrex for her Partner.   Thanks,   -Mickel Baas

## 2018-12-07 NOTE — Telephone Encounter (Signed)
Does he have lesions?  If not, no need to treat. Avoid sexual intercourse until rash resolves.

## 2018-12-07 NOTE — Telephone Encounter (Signed)
LMTCB, okay for [facility] to advise patient.  

## 2018-12-07 NOTE — Telephone Encounter (Signed)
Pt advised.   Thanks,   -Schelly Chuba  

## 2018-12-07 NOTE — Telephone Encounter (Signed)
Pt saw dr b on 12-04-2018 and would like a refill on metronidazole and valtrex. [facility] [place] [facility]. Pt also would like dr b to return her call

## 2018-12-07 NOTE — Telephone Encounter (Signed)
-----   Message from Virginia Crews, MD sent at 12/07/2018 12:33 PM EST ----- Swab positive for HSV 1.  This is typically the type of herpes that gives cold sores, but it can be transferred genitally with oral sex.  Continue Valtrex as prescribed.

## 2018-12-08 ENCOUNTER — Inpatient Hospital Stay: Admission: RE | Admit: 2018-12-08 | Payer: Medicaid Other | Source: Ambulatory Visit

## 2018-12-08 ENCOUNTER — Telehealth: Payer: Self-pay

## 2018-12-08 NOTE — Telephone Encounter (Signed)
NA

## 2018-12-08 NOTE — Telephone Encounter (Signed)
-----   Message from Virginia Crews, MD sent at 12/08/2018  8:28 AM EST ----- STD screening is negative, but she is positive for BV and yeast.  If she is still having symptoms of discharge, we can treat with Flagyl 500 mg twice daily x7 days followed by fluconazole once.  Okay to E prescribe these if she states she is still having symptoms.

## 2018-12-08 NOTE — Telephone Encounter (Signed)
LMTCB-OK for the [facility] to give results.

## 2018-12-08 NOTE — Telephone Encounter (Signed)
Have sent a mychart message also.

## 2018-12-15 ENCOUNTER — Other Ambulatory Visit: Payer: Self-pay | Admitting: Physician Assistant

## 2018-12-15 MED ORDER — VALACYCLOVIR HCL 1 G PO TABS: 1000.0000 mg | ORAL_TABLET | Freq: Every day | ORAL | 5 refills | Status: DC

## 2018-12-15 NOTE — Telephone Encounter (Signed)
From [facility] 

## 2018-12-15 NOTE — Telephone Encounter (Signed)
Medication Refill: valACYclovir (VALTREX) 1000 MG tablet [id number]   Pharmacy:  [id number] - [place], [place] [phone] (Phone) [phone] (Fax)   Pt would like a call back regarding medication from the nurse.   Please advise

## 2018-12-15 NOTE — Telephone Encounter (Signed)
Pt called again for status on refill request/ I advised Pt of request time and she stated she would call back tomorrow and this medication is needed asap

## 2018-12-21 ENCOUNTER — Other Ambulatory Visit: Payer: Medicaid Other

## 2019-01-04 ENCOUNTER — Ambulatory Visit (INDEPENDENT_AMBULATORY_CARE_PROVIDER_SITE_OTHER): Payer: Medicaid Other | Admitting: Physician Assistant

## 2019-01-04 ENCOUNTER — Other Ambulatory Visit: Payer: Self-pay

## 2019-01-04 ENCOUNTER — Ambulatory Visit: Payer: Self-pay | Admitting: Physician Assistant

## 2019-01-04 DIAGNOSIS — Z3042 Encounter for surveillance of injectable contraceptive: Secondary | ICD-10-CM | POA: Diagnosis not present

## 2019-01-04 MED ORDER — MEDROXYPROGESTERONE ACETATE 150 MG/ML IM SUSP
150.0000 mg | Freq: Once | INTRAMUSCULAR | Status: AC
  Administered 2019-01-04: 150 mg via INTRAMUSCULAR

## 2019-01-04 NOTE — Progress Notes (Signed)
       Patient: Michele Bailey Female    DOB: 1995-03-14   22 y.o.   MRN: [id number] Visit Date: 01/04/2019  Today's Provider: Mar Daring, PA-C   Chief Complaint  Patient presents with  . Depo Injection   Subjective:    Patient is a 23 y.o. Female here for her Depo Injection only.   Allergies  Allergen Reactions  . Augmentin [Amoxicillin-Pot Clavulanate] Nausea Only    Willing to take med if necessary with nausea med     Current Outpatient Medications:  .  cephALEXin (KEFLEX) 500 MG capsule, Take 1 capsule (500 mg total) by mouth 2 (two) times daily., Disp: 14 capsule, Rfl: 0 .  clonazePAM (KLONOPIN) 0.5 MG tablet, Take 1 tablet (0.5 mg total) by mouth at bedtime., Disp: 90 tablet, Rfl: 1 .  fluticasone (FLONASE) 50 MCG/ACT nasal spray, Place 2 sprays into both nostrils daily., Disp: 16 g, Rfl: 6 .  guaiFENesin-dextromethorphan (ROBITUSSIN DM) 100-10 MG/5ML syrup, Take 5 mLs by mouth every 4 (four) hours as needed for cough., Disp: 118 mL, Rfl: 0 .  montelukast (SINGULAIR) 10 MG tablet, Take 1 tablet (10 mg total) by mouth at bedtime., Disp: 30 tablet, Rfl: 3 .  Prenatal Vit-Fe Fumarate-FA (PRENATAL MULTIVITAMIN) TABS tablet, Take 1 tablet by mouth daily at 12 noon., Disp: , Rfl:  .  sertraline (ZOLOFT) 100 MG tablet, Take 1 tablet (100 mg total) by mouth daily., Disp: 90 tablet, Rfl: 3 .  valACYclovir (VALTREX) 1000 MG tablet, Take 1 tablet (1,000 mg total) by mouth daily., Disp: 30 tablet, Rfl: 5  Review of Systems  Social History   Tobacco Use  . Smoking status: Former Smoker    Packs/day: 0.25  . Smokeless tobacco: Never Used  Substance Use Topics  . Alcohol use: No      Objective:   There were no vitals taken for this visit. There were no vitals filed for this visit.There is no height or weight on file to calculate BMI.   Physical Exam   No results found for any visits on 01/04/19.     Assessment & Elbert, PA-C    [facility]

## 2019-02-17 ENCOUNTER — Ambulatory Visit: Payer: Medicaid Other | Admitting: Family Medicine

## 2019-04-12 ENCOUNTER — Other Ambulatory Visit (HOSPITAL_COMMUNITY)
Admission: RE | Admit: 2019-04-12 | Discharge: 2019-04-12 | Disposition: A | Discharge status: Home / Self Care | Payer: Medicaid Other | Source: Ambulatory Visit | Attending: Physician Assistant | Admitting: Physician Assistant

## 2019-04-12 ENCOUNTER — Encounter: Payer: Self-pay | Admitting: Physician Assistant

## 2019-04-12 ENCOUNTER — Ambulatory Visit: Payer: Medicaid Other | Admitting: Physician Assistant

## 2019-04-12 ENCOUNTER — Other Ambulatory Visit: Payer: Self-pay

## 2019-04-12 VITALS — BP 126/85 | HR 60 | Temp 97.3°F | Resp 16 | Wt 201.8 lb

## 2019-04-12 DIAGNOSIS — N760 Acute vaginitis: Secondary | ICD-10-CM | POA: Diagnosis not present

## 2019-04-12 DIAGNOSIS — N898 Other specified noninflammatory disorders of vagina: Secondary | ICD-10-CM | POA: Insufficient documentation

## 2019-04-12 DIAGNOSIS — B9689 Other specified bacterial agents as the cause of diseases classified elsewhere: Secondary | ICD-10-CM | POA: Diagnosis not present

## 2019-04-12 DIAGNOSIS — Z3009 Encounter for other general counseling and advice on contraception: Secondary | ICD-10-CM | POA: Diagnosis not present

## 2019-04-12 DIAGNOSIS — L700 Acne vulgaris: Secondary | ICD-10-CM | POA: Diagnosis not present

## 2019-04-12 DIAGNOSIS — B379 Candidiasis, unspecified: Secondary | ICD-10-CM

## 2019-04-12 DIAGNOSIS — Z6833 Body mass index (BMI) 33.0-33.9, adult: Secondary | ICD-10-CM

## 2019-04-12 DIAGNOSIS — L710 Perioral dermatitis: Secondary | ICD-10-CM | POA: Diagnosis not present

## 2019-04-12 DIAGNOSIS — Z3202 Encounter for pregnancy test, result negative: Secondary | ICD-10-CM

## 2019-04-12 DIAGNOSIS — E6609 Other obesity due to excess calories: Secondary | ICD-10-CM

## 2019-04-12 LAB — POCT URINE PREGNANCY: Preg Test, Ur: NEGATIVE

## 2019-04-12 MED ORDER — MEDROXYPROGESTERONE ACETATE 150 MG/ML IM SUSP
150.0000 mg | Freq: Once | INTRAMUSCULAR | Status: AC
  Administered 2019-04-12: 150 mg via INTRAMUSCULAR

## 2019-04-12 MED ORDER — MUPIROCIN CALCIUM 2 % EX CREA: 1.0000 "application " | TOPICAL_CREAM | Freq: Two times a day (BID) | CUTANEOUS | 0 refills | Status: DC

## 2019-04-12 MED ORDER — TRETINOIN MICROSPHERE 0.04 % EX GEL: Freq: Every day | CUTANEOUS | 0 refills | Status: DC

## 2019-04-12 NOTE — Patient Instructions (Signed)
Tretinoin topical gel What is this medicine? TRETINOIN (TRET i noe in) is a naturally occurring form of vitamin A. It is used on the skin to treat mild to moderate acne. This medicine may be used for other purposes; ask your health care provider or pharmacist if you have questions. COMMON BRAND NAME(S): Atralin, AVITA, Retin-A, Retin-A Micro, Tretin-X What should I tell my health care provider before I take this medicine? They need to know if you have any of these conditions:  eczema  excessive sensitivity to the sun  sunburn  an unusual or allergic reaction to fish (Atralin only), tretinoin, vitamin A, other medicines, foods, dyes, or preservatives  pregnant or trying to get pregnant  breast-feeding How should I use this medicine? This medicine is for external use only. Do not take by mouth. Follow the directions on the prescription label. Gently wash your face with a mild, non-medicated soap before use. Pat the skin dry. Wait 20 to 30 minutes for your skin to dry before use in order to minimize the possibility of skin irritation. Apply enough medicine to cover the affected area and rub in gently. Avoid applying this medicine to your eyes, ears, nostrils, angles of the nose, and mouth. Do not use more often than your doctor or health care professional has recommended. Using too much of this medicine may irritate or increase the irritation of your skin, and will not give faster or better results. Talk to your pediatrician or health care professional regarding the use of this medicine in children. While this drug may be prescribed for selected conditions, precautions do apply. Overdosage: If you think you have taken too much of this medicine contact a poison control center or emergency room at once. NOTE: This medicine is only for you. Do not share this medicine with others. What if I miss a dose? If you miss a dose, skip that dose and continue with your regular schedule. Do not use extra  doses, or use for a longer period of time than directed by your doctor or health care professional. What may interact with this medicine?  medicines or other preparations that may dry your skin such as benzoyl peroxide or salicylic acid  medicines that increase your sensitivity to sunlight such as tetracycline or sulfa drugs This list may not describe all possible interactions. Give your health care provider a list of all the medicines, herbs, non-prescription drugs, or dietary supplements you use. Also tell them if you smoke, drink alcohol, or use illegal drugs. Some items may interact with your medicine. What should I watch for while using this medicine? Your acne may get worse initially and should then start to improve. It may take 2 to 12 weeks before you see the full effect. Do not wash your face more than 2 or 3 times a day, unless directed by your doctor or health care professional. Do not use the following products on the same areas that you are treating with this medicine, unless otherwise directed by your doctor or health care professional: other topical agents with a strong skin drying effect such as products with a high alcohol content, astringents, spices, the peel of lime or other citrus, medicated soaps or shampoos, permanent wave solutions, electrolysis, hair removers or waxes, or any other preparations or processes that might dry or irritate your skin. This medicine can make you more sensitive to the sun. Keep out of the sun. If you cannot avoid being in the sun, wear protective clothing and use sunscreen.  Do not use sun lamps or tanning beds/booths. Avoid cold weather and wind as much as possible, and use clothing to protect you from the weather. Skin treated with this medicine may dry out or get wind burned more easily. What side effects may I notice from receiving this medicine? Side effects that you should report to your doctor or health care professional as soon as  possible:  darkening or lightening of the treated areas  severe burning, itching, crusting, or swelling of the treated areas Side effects that usually do not require medical attention (report to your doctor or health care professional if they continue or are bothersome):  increased sensitivity to the sun  itching  mild stinging  red, inflamed, and irritated skin, the skin may peel after a few days This list may not describe all possible side effects. Call your doctor for medical advice about side effects. You may report side effects to FDA at 1-800-FDA-1088. Where should I keep my medicine? Keep out of the reach of children. Store at room temperature between 15 and 30 degrees C (59 and 86 degrees F). Do not freeze. Keep away from heat and flame. Protect from light. Throw away any unused medicine after the expiration date. NOTE: This sheet is a summary. It may not cover all possible information. If you have questions about this medicine, talk to your doctor, pharmacist, or health care provider.  2020 Elsevier/Gold Standard (2007-09-28 15:44:21)

## 2019-04-12 NOTE — Progress Notes (Signed)
Patient: Michele Bailey Female    DOB: [birth year]   23 y.o.   MRN: [id number] Visit Date: 04/13/2019  Today's Provider: Mar Daring, PA-C   Chief Complaint  Patient presents with  . Contraception   Subjective:     HPI  Patient is here for depo-provera injection. She is late because she was not sure if she had wanted to continue depo. Last Depo Provera shot was 01/04/2019. She started having acne on her chin and around her mouth that she felt may be secondary to depo. She has been trying multiple OTC medications for acne without resolution. She is interested in continuing depo but starting a medicated face wash for acne.   She is also having some vaginal irritation and discharge. Reports this most commonly happens after intercourse with her SO (one partner relationship). They have been together for 10 years and have 2 children together. They do not use protection and use depo-provera IM for contraception.  Allergies  Allergen Reactions  . Augmentin [Amoxicillin-Pot Clavulanate] Nausea Only    Willing to take med if necessary with nausea med     Current Outpatient Medications:  .  Prenatal Vit-Fe Fumarate-FA (PRENATAL MULTIVITAMIN) TABS tablet, Take 1 tablet by mouth daily at 12 noon., Disp: , Rfl:  .  valACYclovir (VALTREX) 1000 MG tablet, Take 1 tablet (1,000 mg total) by mouth daily., Disp: 30 tablet, Rfl: 5 .  clonazePAM (KLONOPIN) 0.5 MG tablet, Take 1 tablet (0.5 mg total) by mouth at bedtime. (Patient not taking: Reported on 04/12/2019), Disp: 90 tablet, Rfl: 1 .  fluticasone (FLONASE) 50 MCG/ACT nasal spray, Place 2 sprays into both nostrils daily. (Patient not taking: Reported on 04/12/2019), Disp: 16 g, Rfl: 6 .  montelukast (SINGULAIR) 10 MG tablet, Take 1 tablet (10 mg total) by mouth at bedtime. (Patient not taking: Reported on 04/12/2019), Disp: 30 tablet, Rfl: 3 .  mupirocin cream (BACTROBAN) 2 %, Apply 1 application topically 2 (two) times daily., Disp: 15  g, Rfl: 0 .  sertraline (ZOLOFT) 100 MG tablet, Take 1 tablet (100 mg total) by mouth daily. (Patient not taking: Reported on 04/12/2019), Disp: 90 tablet, Rfl: 3 .  tretinoin microspheres (RETIN-A MICRO) 0.04 % gel, Apply topically at bedtime., Disp: 45 g, Rfl: 0  Review of Systems  Constitutional: Negative.   Respiratory: Negative.   Cardiovascular: Negative.   Gastrointestinal: Negative.   Genitourinary: Positive for vaginal discharge and vaginal pain. Negative for dyspareunia, dysuria, frequency, genital sores, pelvic pain and urgency.  Skin:       Acne    Social History   Tobacco Use  . Smoking status: Former Smoker    Packs/day: 0.25  . Smokeless tobacco: Never Used  Substance Use Topics  . Alcohol use: No      Objective:   BP 126/85 (BP Location: Left Arm, Patient Position: Sitting, Cuff Size: Large)   Pulse 60   Temp (!) 97.3 F (36.3 C) (Temporal)   Resp 16   Wt 201 lb 12.8 oz (91.5 kg)   BMI 33.84 kg/m  Vitals:   04/12/19 1803  BP: 126/85  Pulse: 60  Resp: 16  Temp: (!) 97.3 F (36.3 C)  TempSrc: Temporal  Weight: 201 lb 12.8 oz (91.5 kg)  Body mass index is 33.84 kg/m.   Physical Exam Vitals reviewed.  Constitutional:      General: She is not in acute distress.    Appearance: Normal appearance. She is well-developed. She is  obese. She is not ill-appearing or diaphoretic.  Cardiovascular:     Rate and Rhythm: Normal rate and regular rhythm.     Pulses: Normal pulses.     Heart sounds: Normal heart sounds. No murmur. No friction rub. No gallop.   Pulmonary:     Effort: Pulmonary effort is normal. No respiratory distress.     Breath sounds: Normal breath sounds. No wheezing or rales.  Abdominal:     General: Abdomen is flat. Bowel sounds are normal. There is no distension.     Palpations: Abdomen is soft. There is no mass.     Tenderness: There is no abdominal tenderness. There is no right CVA tenderness, left CVA tenderness, guarding or rebound.       Hernia: No hernia is present. There is no hernia in the left inguinal area or right inguinal area.  Genitourinary:    General: Normal vulva.     Exam position: Supine.     Pubic Area: No rash.      Labia:        Right: No rash, tenderness, lesion or injury.        Left: No rash, tenderness, lesion or injury.      Urethra: No prolapse or urethral swelling.     Vagina: Erythema and tenderness present. No vaginal discharge or lesions.     Cervix: Normal.     Uterus: Normal.      Adnexa: Right adnexa normal and left adnexa normal.  Musculoskeletal:     Cervical back: Normal range of motion and neck supple.  Lymphadenopathy:     Lower Body: No right inguinal adenopathy. No left inguinal adenopathy.  Skin:    Findings: Acne (chin and jaw line) present.  Neurological:     Mental Status: She is alert.      Results for orders placed or performed in visit on 04/12/19  POCT urine pregnancy  Result Value Ref Range   Preg Test, Ur Negative Negative       Assessment & Plan    1. Encounter for counseling regarding contraception Urine pregnancy negative. Depo-provera restarted. Return in 3 months.  - medroxyPROGESTERone (DEPO-PROVERA) injection 150 mg  2. Class 1 obesity due to excess calories with serious comorbidity and body mass index (BMI) of 33.0 to 33.9 in adult Counseled patient on healthy lifestyle modifications including dieting and exercise.   3. Vaginal irritation Swab collected today. Will f/u pending results. She is starting vaginal probiotics (RepHresh).  - Cervicovaginal ancillary only  4. Perioral dermatitis Acne with overlying infection. Will treat with Bactroban as below. Call if not improving or worsening.  - mupirocin cream (BACTROBAN) 2 %; Apply 1 application topically 2 (two) times daily.  Dispense: 15 g; Refill: 0  5. Acne vulgaris Will start with Retin A for acne as below. She is to call or send mychart message if not improving and will change therapy.   - tretinoin microspheres (RETIN-A MICRO) 0.04 % gel; Apply topically at bedtime.  Dispense: 45 g; Refill: 0     Mar Daring, PA-C  [facility]

## 2019-04-14 ENCOUNTER — Telehealth: Payer: Self-pay | Admitting: Physician Assistant

## 2019-04-14 DIAGNOSIS — L700 Acne vulgaris: Secondary | ICD-10-CM

## 2019-04-14 LAB — CERVICOVAGINAL ANCILLARY ONLY
Bacterial Vaginitis (gardnerella): POSITIVE — AB
Candida Glabrata: NEGATIVE
Candida Vaginitis: POSITIVE — AB
Chlamydia: NEGATIVE
Comment: NEGATIVE
Comment: NEGATIVE
Comment: NEGATIVE
Comment: NEGATIVE
Comment: NEGATIVE
Comment: NORMAL
Neisseria Gonorrhea: NEGATIVE
Trichomonas: NEGATIVE

## 2019-04-14 NOTE — Telephone Encounter (Signed)
Copied from [place] [phone]. Topic: Quick Communication - Rx Refill/Question >> Apr 14, 2019  4:56 PM Michele Bailey, [place] A wrote: Medication: tretinoin microspheres (RETIN-A MICRO) 0.04 % gel ,mupirocin cream (BACTROBAN) 2 % (Patient stated that pharmacy needs prior authorization on medication from pcp)  Has the patient contacted their pharmacy? Yes (Agent: If no, request that the patient contact the pharmacy for the refill.) (Agent: If yes, when and what did the pharmacy advise?)Contact PCP  Preferred Pharmacy (with phone number or street name): [id number] - [place], [place]  Phone:  [phone] Fax:  [phone]     Agent: Please be advised that RX refills may take up to 3 business days. We ask that you follow-up with your pharmacy.

## 2019-04-14 NOTE — Telephone Encounter (Signed)
See TE. Need PA for this medication.

## 2019-04-15 ENCOUNTER — Telehealth: Payer: Self-pay | Admitting: Physician Assistant

## 2019-04-15 MED ORDER — METRONIDAZOLE 500 MG PO TABS: 500.0000 mg | ORAL_TABLET | Freq: Two times a day (BID) | ORAL | 0 refills | Status: DC

## 2019-04-15 MED ORDER — FLUCONAZOLE 150 MG PO TABS: 150.0000 mg | ORAL_TABLET | Freq: Once | ORAL | 0 refills | Status: AC

## 2019-04-15 MED ORDER — TRETINOIN 0.01 % EX GEL: Freq: Every day | CUTANEOUS | 0 refills | Status: DC

## 2019-04-15 NOTE — Telephone Encounter (Signed)
Changed therapy

## 2019-04-15 NOTE — Telephone Encounter (Signed)
Pt called in to discuss her lab results. Pt says that she can view them on mychart but she wish to discuss them with someone. Pt also stated that in her apt provider discussed a facial ointment. Pt would like to follow up on the authorization ?

## 2019-04-15 NOTE — Telephone Encounter (Signed)
Tretinoin 0.01% approved by [place] [facility]

## 2019-04-15 NOTE — Addendum Note (Signed)
Addended by: Mar Daring on: 04/15/2019 01:08 PM   Modules accepted: Orders

## 2019-04-15 NOTE — Telephone Encounter (Signed)
LMTCB ok for the [facility] triage nurse to give results to patient if she calls back.  Also regarding the facial ointment another gel for the face was sent in today and [place] track did approved the medicine. She sent in Tretinoin 0.01%. She should call check with the pharmacy.

## 2019-04-15 NOTE — Telephone Encounter (Signed)
-----   Message from Mar Daring, PA-C sent at 04/15/2019  1:06 PM EDT ----- Vaginal swab was positive for yeast and also bacterial vaginosis. I will send in Diflucan for yeast and metronidazole for BV.

## 2019-04-16 NOTE — Telephone Encounter (Signed)
LMTCB or to view her results mychart and to view providers comment. I also Left Voicemail stating that her Tretinoin gel was approved.

## 2019-04-30 ENCOUNTER — Telehealth: Payer: Self-pay | Admitting: Physician Assistant

## 2019-04-30 NOTE — Telephone Encounter (Signed)
Patient is calling to report that face cream prescribed by Tawanna Sat did not work. Wanting an alternative medication Preferred [facility]- [facility]- [phone]

## 2019-05-13 ENCOUNTER — Other Ambulatory Visit: Payer: Self-pay

## 2019-05-13 ENCOUNTER — Ambulatory Visit (INDEPENDENT_AMBULATORY_CARE_PROVIDER_SITE_OTHER): Payer: Medicaid Other | Admitting: Physician Assistant

## 2019-05-13 ENCOUNTER — Encounter: Payer: Self-pay | Admitting: Physician Assistant

## 2019-05-13 VITALS — BP 140/86 | HR 68 | Temp 97.1°F | Wt 199.4 lb

## 2019-05-13 DIAGNOSIS — M542 Cervicalgia: Secondary | ICD-10-CM

## 2019-05-13 DIAGNOSIS — F411 Generalized anxiety disorder: Secondary | ICD-10-CM | POA: Diagnosis not present

## 2019-05-13 MED ORDER — CYCLOBENZAPRINE HCL 5 MG PO TABS: 5.0000 mg | ORAL_TABLET | Freq: Every day | ORAL | 0 refills | Status: DC

## 2019-05-13 MED ORDER — PAROXETINE HCL 10 MG PO TABS: 10.0000 mg | ORAL_TABLET | Freq: Every day | ORAL | 0 refills | Status: DC

## 2019-05-13 NOTE — Progress Notes (Signed)
Established patient visit   Patient: Michele Bailey   DOB: [birth year]   24 y.o. Female  MRN: [id number] Visit Date: 05/13/2019  Today's healthcare provider: Trinna Post, PA-C   Chief Complaint  Patient presents with  . Headache   [employer] as a scribe for Trinna Post, PA-C.,have documented all relevant documentation on the behalf of Trinna Post, PA-C,as directed by  Trinna Post, PA-C while in the presence of Trinna Post, PA-C.  Subjective    Headache  This is a new problem. The current episode started more than 1 year ago. The problem occurs intermittently. The problem has been gradually worsening. The pain is located in the occipital region. The pain radiates to the upper back. The pain quality is similar to prior headaches. The quality of the pain is described as sharp and shooting. Associated symptoms include back pain and neck pain. Associated symptoms comments: SOB and elevated heart rate on exertion ie. walking from parking lot to building. The symptoms are aggravated by activity (movement, bending down and coughing ). Treatments tried: Motrin. The treatment provided mild relief. Her past medical history is significant for hypertension (during pregnancy) and migraines in the family. There is no history of migraine headaches.      Medications: Outpatient Medications Prior to Visit  Medication Sig  . mupirocin cream (BACTROBAN) 2 % Apply 1 application topically 2 (two) times daily.  . Prenatal Vit-Fe Fumarate-FA (PRENATAL MULTIVITAMIN) TABS tablet Take 1 tablet by mouth daily at 12 noon.  . tretinoin (RETIN-A) 0.01 % gel Apply topically at bedtime.  . valACYclovir (VALTREX) 1000 MG tablet Take 1 tablet (1,000 mg total) by mouth daily.  . clonazePAM (KLONOPIN) 0.5 MG tablet Take 1 tablet (0.5 mg total) by mouth at bedtime. (Patient not taking: Reported on 04/12/2019)  . montelukast (SINGULAIR) 10 MG tablet Take 1 tablet (10 mg total) by  mouth at bedtime. (Patient not taking: Reported on 04/12/2019)  . sertraline (ZOLOFT) 100 MG tablet Take 1 tablet (100 mg total) by mouth daily. (Patient not taking: Reported on 04/12/2019)  . [DISCONTINUED] fluticasone (FLONASE) 50 MCG/ACT nasal spray Place 2 sprays into both nostrils daily. (Patient not taking: Reported on 04/12/2019)  . [DISCONTINUED] metroNIDAZOLE (FLAGYL) 500 MG tablet Take 1 tablet (500 mg total) by mouth 2 (two) times daily.   No facility-administered medications prior to visit.    Review of Systems  Constitutional: Positive for diaphoresis and fatigue.  Respiratory: Positive for shortness of breath (on exertion ).   Cardiovascular: Negative.   Musculoskeletal: Positive for back pain and neck pain.  Neurological: Positive for headaches.      Objective    BP 140/86 (BP Location: Right Arm, Patient Position: Sitting, Cuff Size: Large)   Pulse 68   Temp (!) 97.1 F (36.2 C) (Temporal)   Wt 199 lb 6.4 oz (90.4 kg)   Breastfeeding No   BMI 33.44 kg/m    Physical Exam Constitutional:      Appearance: Normal appearance.  HENT:     Head: Normocephalic and atraumatic.  Neck:     Comments: minimal tenderness on left side of neck  Cardiovascular:     Rate and Rhythm: Normal rate and regular rhythm.     Pulses: Normal pulses.     Heart sounds: Normal heart sounds.  Pulmonary:     Effort: Pulmonary effort is normal.     Breath sounds: Normal breath sounds.  Musculoskeletal:     Cervical  back: Normal range of motion.  Lymphadenopathy:     Cervical: No cervical adenopathy.  Skin:    General: Skin is warm and dry.  Neurological:     Mental Status: She is alert and oriented to person, place, and time. Mental status is at baseline.  Psychiatric:        Mood and Affect: Mood normal.        Behavior: Behavior normal.      No results found for any visits on 05/13/19.  Assessment & Plan    1. GAD (generalized anxiety disorder) Start Paxil 10 mg. Follow up  with [facility] in 6 weeks. Patient declined referral to a therapist at this time. She will contact us if she decides to proceed with referral. Check labs as below. Suspect symptoms are due to anxiety.   Will Start Paxil 10 mg mg daily Discussed potential side effects, incl GI upset, sexual dysfunction, increased anxiety, and SI Discussed that it can take 6-8 weeks to reach full efficacy Contracted for safety - no SI/HI Discussed synergistic effects of medications and therapy   - TSH - CBC with Differential - Comprehensive Metabolic Panel (CMET) - PARoxetine (PAXIL) 10 MG tablet; Take 1 tablet (10 mg total) by mouth daily.  Dispense: 90 tablet; Refill: 0  2. Neck pain  - cyclobenzaprine (FLEXERIL) 5 MG tablet; Take 1 tablet (5 mg total) by mouth at bedtime.  Dispense: 30 tablet; Refill: 0   Return in about 6 weeks (around 06/24/2019).      ITrinna Post, PA-C, have reviewed all documentation for this visit. The documentation on 05/13/19 for the exam, diagnosis, procedures, and orders are all accurate and complete.    Paulene Floor  [facility] [phone] (phone) [phone] (fax)  [place]

## 2019-05-14 LAB — CBC WITH DIFFERENTIAL/PLATELET
Basophils Absolute: 0.1 10*3/uL (ref 0.0–0.2)
Basos: 1 %
EOS (ABSOLUTE): 0.1 10*3/uL (ref 0.0–0.4)
Eos: 1 %
Hematocrit: 43 % (ref 34.0–46.6)
Hemoglobin: 14.8 g/dL (ref 11.1–15.9)
Immature Grans (Abs): 0 10*3/uL (ref 0.0–0.1)
Immature Granulocytes: 0 %
Lymphocytes Absolute: 2.8 10*3/uL (ref 0.7–3.1)
Lymphs: 27 %
MCH: 31.6 pg (ref 26.6–33.0)
MCHC: 34.4 g/dL (ref 31.5–35.7)
MCV: 92 fL (ref 79–97)
Monocytes Absolute: 0.6 10*3/uL (ref 0.1–0.9)
Monocytes: 6 %
Neutrophils Absolute: 6.7 10*3/uL (ref 1.4–7.0)
Neutrophils: 65 %
Platelets: 422 10*3/uL (ref 150–450)
RBC: 4.69 x10E6/uL (ref 3.77–5.28)
RDW: 12.6 % (ref 11.7–15.4)
WBC: 10.3 10*3/uL (ref 3.4–10.8)

## 2019-05-14 LAB — COMPREHENSIVE METABOLIC PANEL
ALT: 14 IU/L (ref 0–32)
AST: 18 IU/L (ref 0–40)
Albumin/Globulin Ratio: 1.5 (ref 1.2–2.2)
Albumin: 4.9 g/dL (ref 3.9–5.0)
Alkaline Phosphatase: 103 IU/L (ref 39–117)
BUN/Creatinine Ratio: 12 (ref 9–23)
BUN: 11 mg/dL (ref 6–20)
Bilirubin Total: 0.4 mg/dL (ref 0.0–1.2)
CO2: 19 mmol/L — ABNORMAL LOW (ref 20–29)
Calcium: 10.1 mg/dL (ref 8.7–10.2)
Chloride: 103 mmol/L (ref 96–106)
Creatinine, Ser: 0.92 mg/dL (ref 0.57–1.00)
GFR calc Af Amer: 101 mL/min/{1.73_m2} (ref >59)
GFR calc non Af Amer: 87 mL/min/{1.73_m2} (ref >59)
Globulin, Total: 3.2 g/dL (ref 1.5–4.5)
Glucose: 87 mg/dL (ref 65–99)
Potassium: 3.7 mmol/L (ref 3.5–5.2)
Sodium: 144 mmol/L (ref 134–144)
Total Protein: 8.1 g/dL (ref 6.0–8.5)

## 2019-05-14 LAB — TSH: TSH: 1.69 u[IU]/mL (ref 0.450–4.500)

## 2019-05-15 ENCOUNTER — Encounter: Payer: Self-pay | Admitting: Physician Assistant

## 2019-05-15 DIAGNOSIS — J4520 Mild intermittent asthma, uncomplicated: Secondary | ICD-10-CM

## 2019-05-19 MED ORDER — ALBUTEROL SULFATE HFA 108 (90 BASE) MCG/ACT IN AERS: 2.0000 | INHALATION_SPRAY | Freq: Four times a day (QID) | RESPIRATORY_TRACT | 1 refills | Status: DC | PRN

## 2019-05-19 NOTE — Addendum Note (Signed)
Addended by: Mar Daring on: 05/19/2019 09:23 AM   Modules accepted: Orders

## 2019-05-26 ENCOUNTER — Other Ambulatory Visit: Payer: Self-pay

## 2019-05-26 ENCOUNTER — Ambulatory Visit
Admission: RE | Admit: 2019-05-26 | Discharge: 2019-05-26 | Disposition: A | Discharge status: Home / Self Care | Payer: Medicaid Other | Source: Ambulatory Visit | Attending: Physician Assistant | Admitting: Physician Assistant

## 2019-05-26 ENCOUNTER — Ambulatory Visit
Admission: RE | Admit: 2019-05-26 | Discharge: 2019-05-26 | Disposition: A | Discharge status: Home / Self Care | Payer: Medicaid Other | Attending: Physician Assistant | Admitting: Physician Assistant

## 2019-05-26 ENCOUNTER — Ambulatory Visit: Payer: Medicaid Other | Admitting: Physician Assistant

## 2019-05-26 ENCOUNTER — Ambulatory Visit (INDEPENDENT_AMBULATORY_CARE_PROVIDER_SITE_OTHER): Payer: Medicaid Other | Admitting: Physician Assistant

## 2019-05-26 ENCOUNTER — Encounter: Payer: Self-pay | Admitting: Physician Assistant

## 2019-05-26 VITALS — BP 137/88 | HR 106 | Temp 96.8°F | Wt 194.2 lb

## 2019-05-26 DIAGNOSIS — M79642 Pain in left hand: Secondary | ICD-10-CM

## 2019-05-26 DIAGNOSIS — M79641 Pain in right hand: Secondary | ICD-10-CM | POA: Diagnosis not present

## 2019-05-26 DIAGNOSIS — M25532 Pain in left wrist: Secondary | ICD-10-CM | POA: Diagnosis not present

## 2019-05-26 DIAGNOSIS — M25522 Pain in left elbow: Secondary | ICD-10-CM

## 2019-05-26 DIAGNOSIS — S62634A Displaced fracture of distal phalanx of right ring finger, initial encounter for closed fracture: Secondary | ICD-10-CM

## 2019-05-26 DIAGNOSIS — W19XXXA Unspecified fall, initial encounter: Secondary | ICD-10-CM

## 2019-05-26 DIAGNOSIS — S59902A Unspecified injury of left elbow, initial encounter: Secondary | ICD-10-CM | POA: Diagnosis not present

## 2019-05-26 DIAGNOSIS — M79602 Pain in left arm: Secondary | ICD-10-CM

## 2019-05-26 DIAGNOSIS — S59912A Unspecified injury of left forearm, initial encounter: Secondary | ICD-10-CM | POA: Diagnosis not present

## 2019-05-26 DIAGNOSIS — S6992XA Unspecified injury of left wrist, hand and finger(s), initial encounter: Secondary | ICD-10-CM | POA: Diagnosis not present

## 2019-05-26 NOTE — Progress Notes (Signed)
Established patient visit   Patient: Michele Bailey   DOB: [birth year]   24 y.o. Female  MRN: [id number] Visit Date: 05/26/2019  Today's healthcare provider: Trinna Post, PA-C   Chief Complaint  Patient presents with  . Fall   Esperanza Heir [place],acting as a Education administrator for [employer], PA-C.,have documented all relevant documentation on the behalf of Trinna Post, PA-C,as directed by  Trinna Post, PA-C while in the presence of Trinna Post, PA-C.  Subjective    Fall Incident onset: yesterday. Fall occurred: skates. Impact surface: rocks, tree brush and dirt hills. Pain location: right ring finger, left elbow and upper arm, The symptoms are aggravated by movement and use of injured limb. She has tried nothing for the symptoms.  Patient was roller skating in a trail. Patient fell in a ditch of rocks and tree brush. Tetanus is UTD.       Medications: Outpatient Medications Prior to Visit  Medication Sig  . albuterol (VENTOLIN HFA) 108 (90 Base) MCG/ACT inhaler Inhale 2 puffs into the lungs every 6 (six) hours as needed for wheezing or shortness of breath.  . cyclobenzaprine (FLEXERIL) 5 MG tablet Take 1 tablet (5 mg total) by mouth at bedtime.  . montelukast (SINGULAIR) 10 MG tablet Take 1 tablet (10 mg total) by mouth at bedtime.  . mupirocin cream (BACTROBAN) 2 % Apply 1 application topically 2 (two) times daily.  Marland Kitchen PARoxetine (PAXIL) 10 MG tablet Take 1 tablet (10 mg total) by mouth daily.  . Prenatal Vit-Fe Fumarate-FA (PRENATAL MULTIVITAMIN) TABS tablet Take 1 tablet by mouth daily at 12 noon.  . sertraline (ZOLOFT) 100 MG tablet Take 1 tablet (100 mg total) by mouth daily.  Marland Kitchen tretinoin (RETIN-A) 0.01 % gel Apply topically at bedtime.  . valACYclovir (VALTREX) 1000 MG tablet Take 1 tablet (1,000 mg total) by mouth daily.  . clonazePAM (KLONOPIN) 0.5 MG tablet Take 1 tablet (0.5 mg total) by mouth at bedtime. (Patient not taking: Reported on 04/12/2019)    No facility-administered medications prior to visit.    Review of Systems  Constitutional: Negative.   Respiratory: Negative.   Cardiovascular: Negative.   Musculoskeletal:       Left elbow pain, right fourth finger pain       Objective    BP 137/88 (BP Location: Left Arm, Patient Position: Sitting, Cuff Size: Large)   Pulse (!) 106   Temp (!) 96.8 F (36 C) (Temporal)   Wt 194 lb 3.2 oz (88.1 kg)   BMI 32.57 kg/m    Physical Exam Constitutional:      Appearance: Normal appearance.  Cardiovascular:     Rate and Rhythm: Normal rate and regular rhythm.     Pulses: Normal pulses.     Heart sounds: Normal heart sounds.  Pulmonary:     Effort: Pulmonary effort is normal.     Breath sounds: Normal breath sounds.  Musculoskeletal:        General: Swelling and signs of injury present.     Right elbow: Normal.     Left elbow: Decreased range of motion. Tenderness present in medial epicondyle and lateral epicondyle.     Right forearm: Normal.     Left forearm: Normal.     Right wrist: Normal.     Left wrist: Normal.     Right hand: Swelling present. Decreased range of motion. Normal capillary refill.     Left hand: Normal.     Comments: Swollen  fourth right finger  bruising from the right PIP distally  Bruising on left upper arm   Skin:    General: Skin is warm and dry.  Neurological:     Mental Status: She is alert and oriented to person, place, and time. Mental status is at baseline.  Psychiatric:        Mood and Affect: Mood normal.        Behavior: Behavior normal.       No results found for any visits on 05/26/19.  Assessment & Plan    1. Fall, initial encounter Will get X-Rays depending on results may need Ortho referral. If Ortho referral is needed patient prefers [facility].   2. Right hand pain  - DG Hand Complete Right; Future  3. Left hand pain  - DG Wrist Complete Left; Future - DG Hand Complete Left; Future  4. Left elbow pain  -  DG Elbow Complete Left; Future  5. Left arm pain  - DG Forearm Left; Future   Return if symptoms worsen or fail to improve.      ITrinna Post, PA-C, have reviewed all documentation for this visit. The documentation on 05/26/19 for the exam, diagnosis, procedures, and orders are all accurate and complete.    Paulene Floor  [facility] [phone] (phone) [phone] (fax)  [place]

## 2019-05-26 NOTE — Progress Notes (Deleted)
     Established patient visit   Patient: Michele Bailey   DOB: 12/09/1995   23 y.o. Female  MRN: [id number] Visit Date: 05/26/2019  Today's healthcare provider: Trinna Post, PA-C   No chief complaint on file.  Subjective    Fall   ***  {Show patient history (optional):23778::" "}   Medications: Outpatient Medications Prior to Visit  Medication Sig  . albuterol (VENTOLIN HFA) 108 (90 Base) MCG/ACT inhaler Inhale 2 puffs into the lungs every 6 (six) hours as needed for wheezing or shortness of breath.  . clonazePAM (KLONOPIN) 0.5 MG tablet Take 1 tablet (0.5 mg total) by mouth at bedtime. (Patient not taking: Reported on 04/12/2019)  . cyclobenzaprine (FLEXERIL) 5 MG tablet Take 1 tablet (5 mg total) by mouth at bedtime.  . montelukast (SINGULAIR) 10 MG tablet Take 1 tablet (10 mg total) by mouth at bedtime. (Patient not taking: Reported on 04/12/2019)  . mupirocin cream (BACTROBAN) 2 % Apply 1 application topically 2 (two) times daily.  Marland Kitchen PARoxetine (PAXIL) 10 MG tablet Take 1 tablet (10 mg total) by mouth daily.  . Prenatal Vit-Fe Fumarate-FA (PRENATAL MULTIVITAMIN) TABS tablet Take 1 tablet by mouth daily at 12 noon.  . sertraline (ZOLOFT) 100 MG tablet Take 1 tablet (100 mg total) by mouth daily. (Patient not taking: Reported on 04/12/2019)  . tretinoin (RETIN-A) 0.01 % gel Apply topically at bedtime.  . valACYclovir (VALTREX) 1000 MG tablet Take 1 tablet (1,000 mg total) by mouth daily.   No facility-administered medications prior to visit.    Review of Systems  Constitutional: Negative.   Respiratory: Negative.   Cardiovascular: Negative.   Musculoskeletal: Negative.     {Show previous labs (optional):23779::" "}  Objective    There were no vitals taken for this visit. {Show previous vital signs (optional):23777::" "}  Physical Exam  ***  No results found for any visits on 05/26/19.  Assessment & Plan     ***  No follow-ups on file.      {provider  attestation***:1}   Paulene Floor  [facility] [phone] (phone) [phone] (fax)  [place]

## 2019-05-27 ENCOUNTER — Encounter: Payer: Self-pay | Admitting: Physician Assistant

## 2019-05-28 ENCOUNTER — Other Ambulatory Visit: Payer: Self-pay | Admitting: Physician Assistant

## 2019-05-28 DIAGNOSIS — L710 Perioral dermatitis: Secondary | ICD-10-CM

## 2019-05-28 DIAGNOSIS — L700 Acne vulgaris: Secondary | ICD-10-CM

## 2019-05-28 MED ORDER — MUPIROCIN CALCIUM 2 % EX CREA: 1.0000 "application " | TOPICAL_CREAM | Freq: Two times a day (BID) | CUTANEOUS | 0 refills | Status: DC

## 2019-05-28 MED ORDER — TRETINOIN 0.01 % EX GEL: Freq: Every day | CUTANEOUS | 0 refills | Status: DC

## 2019-06-01 ENCOUNTER — Other Ambulatory Visit: Payer: Self-pay

## 2019-06-01 ENCOUNTER — Ambulatory Visit
Admission: RE | Admit: 2019-06-01 | Discharge: 2019-06-01 | Disposition: A | Discharge status: Home / Self Care | Payer: Medicaid Other | Attending: Adult Health | Admitting: Adult Health

## 2019-06-01 ENCOUNTER — Telehealth: Payer: Self-pay

## 2019-06-01 ENCOUNTER — Ambulatory Visit
Admission: RE | Admit: 2019-06-01 | Discharge: 2019-06-01 | Disposition: A | Discharge status: Home / Self Care | Payer: Medicaid Other | Source: Ambulatory Visit | Attending: Adult Health | Admitting: Adult Health

## 2019-06-01 DIAGNOSIS — R0989 Other specified symptoms and signs involving the circulatory and respiratory systems: Secondary | ICD-10-CM | POA: Insufficient documentation

## 2019-06-01 DIAGNOSIS — S62664A Nondisplaced fracture of distal phalanx of right ring finger, initial encounter for closed fracture: Secondary | ICD-10-CM | POA: Diagnosis not present

## 2019-06-01 DIAGNOSIS — R05 Cough: Secondary | ICD-10-CM | POA: Diagnosis not present

## 2019-06-01 DIAGNOSIS — R059 Cough, unspecified: Secondary | ICD-10-CM

## 2019-06-01 NOTE — Telephone Encounter (Signed)
Copied from [place] [phone]. Topic: General - Other >> Jun 01, 2019 12:35 PM Hinda Lenis D wrote: Reason for CRM: PT is at [facility] waiting for the order / this is the note from PCP  "Also we can get a CXR to make sure lungs look ok. I will place order for [facility] outpatient imaging"

## 2019-06-02 NOTE — Progress Notes (Signed)
Chest x ray is within normal limits/ schedule follow up with a provider / PCP if symptoms persistent.

## 2019-06-24 ENCOUNTER — Encounter: Payer: Medicaid Other | Admitting: Physician Assistant

## 2019-06-24 ENCOUNTER — Other Ambulatory Visit: Payer: Self-pay

## 2019-06-24 NOTE — Progress Notes (Signed)
Patient had to leave before being seen.

## 2019-06-28 ENCOUNTER — Ambulatory Visit: Payer: Self-pay | Admitting: Physician Assistant

## 2019-06-28 NOTE — Progress Notes (Deleted)
     Established patient visit   Patient: Michele Bailey   DOB: [birth year]   24 y.o. Female  MRN: [id number] Visit Date: 06/28/2019  Today's healthcare provider: Mar Daring, PA-C   No chief complaint on file.  Subjective    HPI  Follow up for ***  The patient was last seen for this 2 months ago. Changes made at last visit include Start Paxil 10 mg  She reports {excellent/good/fair/poor:19665} compliance with treatment. She feels that condition is {improved/worse/unchanged:3041574}. She {is/is not:21021397} having side effects. ***  -----------------------------------------------------------------------------------------   {Show patient history (optional):23778::" "}   Medications: Outpatient Medications Prior to Visit  Medication Sig  . albuterol (VENTOLIN HFA) 108 (90 Base) MCG/ACT inhaler Inhale 2 puffs into the lungs every 6 (six) hours as needed for wheezing or shortness of breath.  . clonazePAM (KLONOPIN) 0.5 MG tablet Take 1 tablet (0.5 mg total) by mouth at bedtime. (Patient not taking: Reported on 04/12/2019)  . cyclobenzaprine (FLEXERIL) 5 MG tablet Take 1 tablet (5 mg total) by mouth at bedtime.  . montelukast (SINGULAIR) 10 MG tablet Take 1 tablet (10 mg total) by mouth at bedtime.  . mupirocin cream (BACTROBAN) 2 % Apply 1 application topically 2 (two) times daily.  Marland Kitchen PARoxetine (PAXIL) 10 MG tablet Take 1 tablet (10 mg total) by mouth daily.  . Prenatal Vit-Fe Fumarate-FA (PRENATAL MULTIVITAMIN) TABS tablet Take 1 tablet by mouth daily at 12 noon.  . sertraline (ZOLOFT) 100 MG tablet Take 1 tablet (100 mg total) by mouth daily.  Marland Kitchen tretinoin (RETIN-A) 0.01 % gel Apply topically at bedtime.  . valACYclovir (VALTREX) 1000 MG tablet Take 1 tablet (1,000 mg total) by mouth daily.   No facility-administered medications prior to visit.    Review of Systems  {Heme  Chem  Endocrine  Serology  Results Review (optional):23779::" "}  Objective    There  were no vitals taken for this visit. {Show previous vital signs (optional):23777::" "}  Physical Exam  ***  No results found for any visits on 06/28/19.  Assessment & Plan     ***  No follow-ups on file.      {provider attestation***:1}   Rubye Beach  [facility] [phone] (phone) [phone] (fax)  [place]

## 2019-06-30 ENCOUNTER — Telehealth: Payer: Self-pay

## 2019-06-30 NOTE — Telephone Encounter (Signed)
NA, voicemail full 

## 2019-06-30 NOTE — Telephone Encounter (Signed)
Copied from [place] [phone]. Topic: Appointment Scheduling - Scheduling Inquiry for Clinic >> Jun 30, 2019 12:51 PM Percell Belt A wrote: Reason for CRM: pt called in and stated she missed appt on the 14th for depo and would like to know if she can be worked in to get it asap or is it to late?   Best [phone]

## 2019-06-30 NOTE — Telephone Encounter (Signed)
Patient may come for Depo, last day 07/12/2019.

## 2019-06-30 NOTE — Telephone Encounter (Signed)
Apt for 07/01/2019 at 6pm  Thanks,   -Mickel Baas

## 2019-07-01 ENCOUNTER — Other Ambulatory Visit: Payer: Self-pay

## 2019-07-01 ENCOUNTER — Ambulatory Visit: Payer: Medicaid Other | Admitting: Physician Assistant

## 2019-07-01 DIAGNOSIS — Z3042 Encounter for surveillance of injectable contraceptive: Secondary | ICD-10-CM

## 2019-07-01 MED ORDER — MEDROXYPROGESTERONE ACETATE 150 MG/ML IM SUSP
150.0000 mg | Freq: Once | INTRAMUSCULAR | Status: AC
  Administered 2019-07-01: 150 mg via INTRAMUSCULAR

## 2019-07-01 NOTE — Progress Notes (Signed)
Nurse Visit: patient here for Depo Provera injection. She is to return between Sept 02 - Sept 16.

## 2019-07-02 ENCOUNTER — Ambulatory Visit: Payer: Medicaid Other | Admitting: Physician Assistant

## 2019-07-02 NOTE — Progress Notes (Deleted)
     Established patient visit   Patient: Michele Bailey   DOB: 03/17/1995   23 y.o. Female  MRN: [id number] Visit Date: 07/02/2019  Today's healthcare provider: Mar Daring, PA-C   No chief complaint on file.  Subjective    HPI Follow up for GAD  The patient was last seen for this 6 weeks ago. Changes made at last visit include start Paxil 10mg .  She reports {excellent/good/fair/poor:19665} compliance with treatment. She feels that condition is {improved/worse/unchanged:3041574}. She {is/is not:21021397} having side effects. ***  -----------------------------------------------------------------------------------------   {Show patient history (optional):23778::" "}   Medications: Outpatient Medications Prior to Visit  Medication Sig  . albuterol (VENTOLIN HFA) 108 (90 Base) MCG/ACT inhaler Inhale 2 puffs into the lungs every 6 (six) hours as needed for wheezing or shortness of breath.  . clonazePAM (KLONOPIN) 0.5 MG tablet Take 1 tablet (0.5 mg total) by mouth at bedtime. (Patient not taking: Reported on 04/12/2019)  . cyclobenzaprine (FLEXERIL) 5 MG tablet Take 1 tablet (5 mg total) by mouth at bedtime.  . montelukast (SINGULAIR) 10 MG tablet Take 1 tablet (10 mg total) by mouth at bedtime.  . mupirocin cream (BACTROBAN) 2 % Apply 1 application topically 2 (two) times daily.  Marland Kitchen PARoxetine (PAXIL) 10 MG tablet Take 1 tablet (10 mg total) by mouth daily.  . Prenatal Vit-Fe Fumarate-FA (PRENATAL MULTIVITAMIN) TABS tablet Take 1 tablet by mouth daily at 12 noon.  . sertraline (ZOLOFT) 100 MG tablet Take 1 tablet (100 mg total) by mouth daily.  Marland Kitchen tretinoin (RETIN-A) 0.01 % gel Apply topically at bedtime.  . valACYclovir (VALTREX) 1000 MG tablet Take 1 tablet (1,000 mg total) by mouth daily.   No facility-administered medications prior to visit.    Review of Systems  {Heme  Chem  Endocrine  Serology  Results Review (optional):23779::" "}  Objective    There  were no vitals taken for this visit. {Show previous vital signs (optional):23777::" "}  Physical Exam  ***  No results found for any visits on 07/02/19.  Assessment & Plan     ***  No follow-ups on file.      {provider attestation***:1}   Rubye Beach  [facility] [phone] (phone) [phone] (fax)  [place]

## 2019-07-23 ENCOUNTER — Ambulatory Visit
Admission: RE | Admit: 2019-07-23 | Discharge: 2019-07-23 | Disposition: A | Discharge status: Home / Self Care | Payer: Medicaid Other | Source: Ambulatory Visit | Attending: Physician Assistant | Admitting: Physician Assistant

## 2019-07-23 ENCOUNTER — Encounter: Payer: Self-pay | Admitting: Physician Assistant

## 2019-07-23 DIAGNOSIS — N644 Mastodynia: Secondary | ICD-10-CM | POA: Diagnosis not present

## 2019-07-23 NOTE — Telephone Encounter (Signed)
Called patient and answered questions

## 2019-07-26 ENCOUNTER — Telehealth: Payer: Self-pay

## 2019-07-26 NOTE — Telephone Encounter (Signed)
Written by Mar Daring, PA-C on 07/23/2019 5:46 PM EDT Seen by patient Michele Bailey on 07/23/2019 8:35 PM

## 2019-07-26 NOTE — Telephone Encounter (Signed)
-----   Message from Mar Daring, [place] sent at 07/23/2019  5:46 PM EDT ----- [place] was normal. They did see a thickened fibrous band that was normal in consistency and texture. No concerning features.

## 2019-08-04 NOTE — Progress Notes (Signed)
Established patient visit   Patient: Michele Bailey   DOB: [birth year]   23 y.o. Female  MRN: [id number] Visit Date: 08/05/2019  Today's healthcare provider: Trinna Post, PA-C   Chief Complaint  Patient presents with  . Elbow Pain  I,Michele Bailey M Michele Bailey,acting as a scribe for Michele Post, PA-C.,have documented all relevant documentation on the behalf of Michele Post, PA-C,as directed by  Michele Post, PA-C while in the presence of Michele Post, PA-C.  Subjective    HPI  Elbow Pain Patient presents today with left elbow pain and swelling for about 2 weeks now. This has been a chronic issue but worsened in the past two months when she fell. She was seen in this office two months prior and prescribed tylenol and muscle relaxer. Patient reports that she has taking tylenol and flexeril for the pain and it helps with the pain. She reports when using her elbow in outwards motion it does hurt a little but does not hurt when turning a door knob. Patient had left elbow x-ray in [id number] which was negative for fracture.      Medications: Outpatient Medications Prior to Visit  Medication Sig  . [DISCONTINUED] albuterol (VENTOLIN HFA) 108 (90 Base) MCG/ACT inhaler Inhale 2 puffs into the lungs every 6 (six) hours as needed for wheezing or shortness of breath.  . [DISCONTINUED] cyclobenzaprine (FLEXERIL) 5 MG tablet Take 1 tablet (5 mg total) by mouth at bedtime.  . clonazePAM (KLONOPIN) 0.5 MG tablet Take 1 tablet (0.5 mg total) by mouth at bedtime. (Patient not taking: Reported on 04/12/2019)  . montelukast (SINGULAIR) 10 MG tablet Take 1 tablet (10 mg total) by mouth at bedtime. (Patient not taking: Reported on 08/05/2019)  . mupirocin cream (BACTROBAN) 2 % Apply 1 application topically 2 (two) times daily. (Patient not taking: Reported on 08/05/2019)  . PARoxetine (PAXIL) 10 MG tablet Take 1 tablet (10 mg total) by mouth daily. (Patient not taking: Reported on 08/05/2019)  .  Prenatal Vit-Fe Fumarate-FA (PRENATAL MULTIVITAMIN) TABS tablet Take 1 tablet by mouth daily at 12 noon. (Patient not taking: Reported on 08/05/2019)  . sertraline (ZOLOFT) 100 MG tablet Take 1 tablet (100 mg total) by mouth daily.  Marland Kitchen tretinoin (RETIN-A) 0.01 % gel Apply topically at bedtime. (Patient not taking: Reported on 08/05/2019)  . valACYclovir (VALTREX) 1000 MG tablet Take 1 tablet (1,000 mg total) by mouth daily. (Patient not taking: Reported on 08/05/2019)   No facility-administered medications prior to visit.    Review of Systems  Respiratory: Negative.   Cardiovascular: Negative.   Musculoskeletal: Positive for joint swelling.  Neurological: Negative.       Objective    BP 120/80 (BP Location: Left Arm, Patient Position: Sitting, Cuff Size: Normal)   Pulse 70   Temp (!) 96.9 F (36.1 C) (Temporal)   Ht 5\' 5"  (1.651 m)   Wt (!) 203 lb 3.2 oz (92.2 kg)   SpO2 99%   BMI 33.81 kg/m    Physical Exam Constitutional:      Appearance: Normal appearance.  Musculoskeletal:     Right elbow: Normal. No tenderness.     Left elbow: Tenderness present in lateral epicondyle. No medial epicondyle tenderness.     Right wrist: Normal.     Left wrist: Normal.     Right hand: Normal.     Left hand: Tenderness present.     Comments: Pain with resisted supination.   Skin:  General: Skin is warm and dry.  Neurological:     Mental Status: She is alert and oriented to person, place, and time. Mental status is at baseline.  Psychiatric:        Mood and Affect: Mood normal.        Behavior: Behavior normal.       No results found for any visits on 08/05/19.  Assessment & Plan    1. Elbow pain, unspecified laterality  Trial of prednisone. If she would like steroid injection she can follow up with [facility] where she is a patient.   - cyclobenzaprine (FLEXERIL) 5 MG tablet; Take 1 tablet (5 mg total) by mouth at bedtime.  Dispense: 30 tablet; Refill: 0 - predniSONE  (DELTASONE) 10 MG tablet; Take tapered dose: Take 6 on day 1, 5 on day 2, 4 on day 3, etc.  Dispense: 21 tablet; Refill: 0  2. Mild intermittent asthma without complication  - albuterol (VENTOLIN HFA) 108 (90 Base) MCG/ACT inhaler; Inhale 2 puffs into the lungs every 6 (six) hours as needed for wheezing or shortness of breath.  Dispense: 18 g; Refill: 1    Return if symptoms worsen or fail to improve.      ITrinna Post, PA-C, have reviewed all documentation for this visit. The documentation on 08/06/19 for the exam, diagnosis, procedures, and orders are all accurate and complete.    Michele Bailey  [facility] [phone] (phone) [phone] (fax)  [place]

## 2019-08-05 ENCOUNTER — Encounter: Payer: Self-pay | Admitting: Physician Assistant

## 2019-08-05 ENCOUNTER — Other Ambulatory Visit: Payer: Self-pay

## 2019-08-05 ENCOUNTER — Ambulatory Visit: Payer: Medicaid Other | Admitting: Physician Assistant

## 2019-08-05 VITALS — BP 120/80 | HR 70 | Temp 96.9°F | Ht 65.0 in | Wt 203.2 lb

## 2019-08-05 DIAGNOSIS — M25529 Pain in unspecified elbow: Secondary | ICD-10-CM | POA: Diagnosis not present

## 2019-08-05 DIAGNOSIS — J4520 Mild intermittent asthma, uncomplicated: Secondary | ICD-10-CM

## 2019-08-05 MED ORDER — ALBUTEROL SULFATE HFA 108 (90 BASE) MCG/ACT IN AERS: 2.0000 | INHALATION_SPRAY | Freq: Four times a day (QID) | RESPIRATORY_TRACT | 1 refills | Status: DC | PRN

## 2019-08-05 MED ORDER — PREDNISONE 10 MG PO TABS: ORAL_TABLET | ORAL | 0 refills | Status: DC

## 2019-08-05 MED ORDER — CYCLOBENZAPRINE HCL 5 MG PO TABS: 5.0000 mg | ORAL_TABLET | Freq: Every day | ORAL | 0 refills | Status: DC

## 2019-08-23 ENCOUNTER — Encounter: Payer: Self-pay | Admitting: Physician Assistant

## 2019-08-23 DIAGNOSIS — L700 Acne vulgaris: Secondary | ICD-10-CM

## 2019-08-25 MED ORDER — CLINDAMYCIN PHOS-BENZOYL PEROX 1-5 % EX GEL: Freq: Two times a day (BID) | CUTANEOUS | 0 refills | Status: DC

## 2019-08-25 NOTE — Addendum Note (Signed)
Addended by: Mar Daring on: 08/25/2019 10:54 AM   Modules accepted: Orders

## 2019-09-16 ENCOUNTER — Ambulatory Visit: Payer: Self-pay | Admitting: Physician Assistant

## 2019-09-21 ENCOUNTER — Other Ambulatory Visit: Payer: Self-pay

## 2019-09-21 ENCOUNTER — Ambulatory Visit (INDEPENDENT_AMBULATORY_CARE_PROVIDER_SITE_OTHER): Payer: Medicaid Other | Admitting: Physician Assistant

## 2019-09-21 ENCOUNTER — Encounter: Payer: Self-pay | Admitting: Physician Assistant

## 2019-09-21 ENCOUNTER — Ambulatory Visit: Payer: Medicaid Other | Admitting: Physician Assistant

## 2019-09-21 VITALS — Temp 98.8°F

## 2019-09-21 DIAGNOSIS — Z3042 Encounter for surveillance of injectable contraceptive: Secondary | ICD-10-CM

## 2019-09-21 MED ORDER — MEDROXYPROGESTERONE ACETATE 150 MG/ML IM SUSP
150.0000 mg | Freq: Once | INTRAMUSCULAR | Status: AC
  Administered 2019-09-21: 150 mg via INTRAMUSCULAR

## 2019-09-21 NOTE — Progress Notes (Signed)
     Established patient visit   Patient: Michele Bailey   DOB: 08-Apr-1995   23 y.o. Female  MRN: [id number] Visit Date: 09/21/2019  Today's healthcare provider: Trinna Post, PA-C   Chief Complaint  Patient presents with  . Contraception   Subjective    HPI   Depo-Provera Patient presents today for depo-provera injection. Her last injection was on 07/01/2019. Patient received injection to left ventrogluteal and tolerated injection well.Patient will be due next between 12/07/19-12/21/2019.     Medications: Outpatient Medications Prior to Visit  Medication Sig  . albuterol (VENTOLIN HFA) 108 (90 Base) MCG/ACT inhaler Inhale 2 puffs into the lungs every 6 (six) hours as needed for wheezing or shortness of breath.  . clindamycin-benzoyl peroxide (BENZACLIN) gel Apply topically 2 (two) times daily.  . clonazePAM (KLONOPIN) 0.5 MG tablet Take 1 tablet (0.5 mg total) by mouth at bedtime. (Patient not taking: Reported on 04/12/2019)  . cyclobenzaprine (FLEXERIL) 5 MG tablet Take 1 tablet (5 mg total) by mouth at bedtime.  . montelukast (SINGULAIR) 10 MG tablet Take 1 tablet (10 mg total) by mouth at bedtime. (Patient not taking: Reported on 08/05/2019)  . mupirocin cream (BACTROBAN) 2 % Apply 1 application topically 2 (two) times daily. (Patient not taking: Reported on 08/05/2019)  . PARoxetine (PAXIL) 10 MG tablet Take 1 tablet (10 mg total) by mouth daily. (Patient not taking: Reported on 08/05/2019)  . predniSONE (DELTASONE) 10 MG tablet Take tapered dose: Take 6 on day 1, 5 on day 2, 4 on day 3, etc.  . Prenatal Vit-Fe Fumarate-FA (PRENATAL MULTIVITAMIN) TABS tablet Take 1 tablet by mouth daily at 12 noon. (Patient not taking: Reported on 08/05/2019)  . sertraline (ZOLOFT) 100 MG tablet Take 1 tablet (100 mg total) by mouth daily.  . valACYclovir (VALTREX) 1000 MG tablet Take 1 tablet (1,000 mg total) by mouth daily. (Patient not taking: Reported on 08/05/2019)   No  facility-administered medications prior to visit.    Review of Systems    Objective    Temp 98.8 F (37.1 C) (Oral)    Physical Exam    No results found for any visits on 09/21/19.  Assessment & Plan    1. Encounter for surveillance of injectable contraceptive  - medroxyPROGESTERone (DEPO-PROVERA) injection 150 mg   No follow-ups on file.      Patient here for depo shot only.  I did not examine the patient.  I did review his medical history, medications, and allergies and vaccine consent form.  CMA gave vaccination. Patient tolerated well.  Paulene Floor [facility] 09/28/2019 12:55 PM     [place], [place] [phone] (phone) [phone] (fax)  [place]

## 2019-12-15 ENCOUNTER — Ambulatory Visit: Payer: Self-pay | Admitting: Physician Assistant

## 2019-12-24 ENCOUNTER — Other Ambulatory Visit: Payer: Self-pay

## 2019-12-24 ENCOUNTER — Ambulatory Visit (INDEPENDENT_AMBULATORY_CARE_PROVIDER_SITE_OTHER): Payer: Medicaid Other | Admitting: Physician Assistant

## 2019-12-24 ENCOUNTER — Encounter: Payer: Self-pay | Admitting: Physician Assistant

## 2019-12-24 VITALS — BP 130/79 | HR 87 | Temp 98.5°F | Wt 220.0 lb

## 2019-12-24 DIAGNOSIS — Z3040 Encounter for surveillance of contraceptives, unspecified: Secondary | ICD-10-CM

## 2019-12-24 DIAGNOSIS — G4700 Insomnia, unspecified: Secondary | ICD-10-CM

## 2019-12-24 MED ORDER — TRAZODONE HCL 50 MG PO TABS: 25.0000 mg | ORAL_TABLET | Freq: Every evening | ORAL | 3 refills | Status: DC | PRN

## 2019-12-24 MED ORDER — NORETHINDRONE 0.35 MG PO TABS: 1.0000 | ORAL_TABLET | Freq: Every day | ORAL | 3 refills | Status: DC

## 2019-12-24 NOTE — Patient Instructions (Signed)

## 2019-12-24 NOTE — Progress Notes (Signed)
Established patient visit   Patient: Michele Bailey   DOB: Oct 20, 1995   23 y.o. Female  MRN: [id number] Visit Date: 12/24/2019  Today's healthcare provider: Trinna Post, PA-C   Chief Complaint  Patient presents with  . Contraception  I,Michele Bailey M Vitaliy Eisenhour,acting as a Education administrator for [employer], PA-C.,have documented all relevant documentation on the behalf of Trinna Post, PA-C,as directed by  Trinna Post, PA-C while in the presence of Trinna Post, PA-C.  Subjective    HPI  Contraception Patient presents today to discuss different contraception. Patient is current on Depo-Provera and had hr last injection on 09/21/2019. She wants to change birth control due to acne. Previously had IUD and doesn't want this again. Does not want vaginal ring. Interested in [place] with least hormones.   Insomnia: Chronic issue. She reports she has trouble falling asleep and staying asleep. Was prescribed Paxil 04/2019 for depression and anxiety but she stopped taking this because she says it makes her feel weird. She has taken zoloft previously as well and stopped that also. History of overdose on klonpopin 11/06/2016 one day after receiving klonopin 0.5 #20.      Medications: Outpatient Medications Prior to Visit  Medication Sig  . albuterol (VENTOLIN HFA) 108 (90 Base) MCG/ACT inhaler Inhale 2 puffs into the lungs every 6 (six) hours as needed for wheezing or shortness of breath.  . cyclobenzaprine (FLEXERIL) 5 MG tablet Take 1 tablet (5 mg total) by mouth at bedtime.  . clindamycin-benzoyl peroxide (BENZACLIN) gel Apply topically 2 (two) times daily. (Patient not taking: Reported on 12/24/2019)  . montelukast (SINGULAIR) 10 MG tablet Take 1 tablet (10 mg total) by mouth at bedtime. (Patient not taking: No sig reported)  . mupirocin cream (BACTROBAN) 2 % Apply 1 application topically 2 (two) times daily. (Patient not taking: No sig reported)  . Prenatal Vit-Fe Fumarate-FA (PRENATAL  MULTIVITAMIN) TABS tablet Take 1 tablet by mouth daily at 12 noon. (Patient not taking: No sig reported)  . valACYclovir (VALTREX) 1000 MG tablet Take 1 tablet (1,000 mg total) by mouth daily. (Patient not taking: No sig reported)  . [DISCONTINUED] clonazePAM (KLONOPIN) 0.5 MG tablet Take 1 tablet (0.5 mg total) by mouth at bedtime. (Patient not taking: No sig reported)  . [DISCONTINUED] PARoxetine (PAXIL) 10 MG tablet Take 1 tablet (10 mg total) by mouth daily. (Patient not taking: No sig reported)  . [DISCONTINUED] predniSONE (DELTASONE) 10 MG tablet Take tapered dose: Take 6 on day 1, 5 on day 2, 4 on day 3, etc. (Patient not taking: Reported on 12/24/2019)  . [DISCONTINUED] sertraline (ZOLOFT) 100 MG tablet Take 1 tablet (100 mg total) by mouth daily.   No facility-administered medications prior to visit.    Review of Systems  Constitutional: Negative.   Respiratory: Negative.   Cardiovascular: Negative.   Hematological: Negative.       Objective    BP 130/79 (BP Location: Left Arm, Patient Position: Sitting, Cuff Size: Large)   Pulse 87   Temp 98.5 F (36.9 C) (Oral)   Wt 220 lb (99.8 kg)   SpO2 100%   BMI 36.61 kg/m    Physical Exam Constitutional:      Appearance: Normal appearance. She is obese.  Skin:    General: Skin is warm and dry.  Neurological:     General: No focal deficit present.     Mental Status: She is alert and oriented to person, place, and time.  Psychiatric:        Mood and Affect: Mood normal.        Behavior: Behavior normal.       No results found for any visits on 12/24/19.  Assessment & Plan     1. Encounter for surveillance of contraceptives, unspecified contraceptive  Reports acne with depo, start as below.  - norethindrone (CAMILA) 0.35 MG tablet; Take 1 tablet (0.35 mg total) by mouth daily.  Dispense: 84 tablet; Refill: 3  2. Insomnia, unspecified type  Chronic, worsening.   - traZODone (DESYREL) 50 MG tablet; Take 0.5-1  tablets (25-50 mg total) by mouth at bedtime as needed for sleep.  Dispense: 30 tablet; Refill: 3   Return if symptoms worsen or fail to improve.      ITrinna Post, PA-C, have reviewed all documentation for this visit. The documentation on 12/28/19 for the exam, diagnosis, procedures, and orders are all accurate and complete.  The entirety of the information documented in the History of Present Illness, Review of Systems and Physical Exam were personally obtained by me. Portions of this information were initially documented by [facility] and reviewed by me for thoroughness and accuracy.     Paulene Floor  [facility] [phone] (phone) [phone] (fax)  [place]

## 2020-01-13 ENCOUNTER — Encounter: Payer: Self-pay | Admitting: Physician Assistant

## 2020-01-13 DIAGNOSIS — L700 Acne vulgaris: Secondary | ICD-10-CM

## 2020-03-12 ENCOUNTER — Encounter: Payer: Self-pay | Admitting: Physician Assistant

## 2020-03-12 DIAGNOSIS — Z30011 Encounter for initial prescription of contraceptive pills: Secondary | ICD-10-CM

## 2020-03-12 DIAGNOSIS — L700 Acne vulgaris: Secondary | ICD-10-CM

## 2020-03-13 MED ORDER — NORGESTIM-ETH ESTRAD TRIPHASIC 0.18/0.215/0.25 MG-35 MCG PO TABS: 1.0000 | ORAL_TABLET | Freq: Every day | ORAL | 11 refills | Status: DC

## 2020-03-13 MED ORDER — BENZOYL PEROXIDE WASH 5 % EX LIQD: Freq: Two times a day (BID) | CUTANEOUS | 12 refills | Status: DC

## 2020-03-23 ENCOUNTER — Encounter: Payer: Self-pay | Admitting: Physician Assistant

## 2020-03-23 DIAGNOSIS — M25529 Pain in unspecified elbow: Secondary | ICD-10-CM

## 2020-03-23 MED ORDER — CYCLOBENZAPRINE HCL 5 MG PO TABS: 5.0000 mg | ORAL_TABLET | Freq: Every day | ORAL | 0 refills | Status: DC

## 2020-04-04 ENCOUNTER — Encounter: Payer: Self-pay | Admitting: Physician Assistant

## 2020-05-30 ENCOUNTER — Encounter: Payer: Self-pay | Admitting: Family Medicine

## 2020-05-30 ENCOUNTER — Other Ambulatory Visit: Payer: Self-pay

## 2020-05-30 ENCOUNTER — Ambulatory Visit (INDEPENDENT_AMBULATORY_CARE_PROVIDER_SITE_OTHER): Payer: Medicaid Other | Admitting: Family Medicine

## 2020-05-30 VITALS — BP 134/83 | HR 73 | Resp 16 | Wt 215.0 lb

## 2020-05-30 DIAGNOSIS — L700 Acne vulgaris: Secondary | ICD-10-CM

## 2020-05-30 DIAGNOSIS — L282 Other prurigo: Secondary | ICD-10-CM

## 2020-05-30 MED ORDER — TRIAMCINOLONE ACETONIDE 0.5 % EX CREA: TOPICAL_CREAM | CUTANEOUS | 1 refills | Status: DC

## 2020-05-30 NOTE — Progress Notes (Signed)
Established patient visit   Patient: Michele Bailey   DOB: 05-23-1995   24 y.o. Female  MRN: [id number] Visit Date: 05/30/2020  Today's healthcare provider: Lelon Huh, MD   Chief Complaint  Patient presents with  . Rash   Subjective    Rash This is a new problem. Episode onset:  4 days ago. The problem has been gradually improving since onset. The affected locations include the left arm, right arm, neck, abdomen and face. The rash is characterized by redness and itchiness. She was exposed to nothing. Pertinent negatives include no fatigue, fever, shortness of breath or vomiting. Treatments tried: Calamine lotion. The treatment provided mild relief.    Patient states he has had facial acne for the past 2 years.   She has an appointment with Dr. Nehemiah Massed scheduled in June.      Medications: Outpatient Medications Prior to Visit  Medication Sig  . albuterol (VENTOLIN HFA) 108 (90 Base) MCG/ACT inhaler Inhale 2 puffs into the lungs every 6 (six) hours as needed for wheezing or shortness of breath.  . cyclobenzaprine (FLEXERIL) 5 MG tablet Take 1 tablet (5 mg total) by mouth at bedtime.  . Norgestimate-Ethinyl Estradiol Triphasic (ORTHO TRI-CYCLEN, 28,) 0.18/0.215/0.25 MG-35 MCG tablet Take 1 tablet by mouth daily.  . Prenatal Vit-Fe Fumarate-FA (PRENATAL MULTIVITAMIN) TABS tablet Take 1 tablet by mouth daily at 12 noon.  . [DISCONTINUED] benzoyl peroxide (BENZOYL PEROXIDE) 5 % external liquid Apply topically 2 (two) times daily. (Patient not taking: Reported on 05/30/2020)  . [DISCONTINUED] montelukast (SINGULAIR) 10 MG tablet Take 1 tablet (10 mg total) by mouth at bedtime. (Patient not taking: No sig reported)  . [DISCONTINUED] mupirocin cream (BACTROBAN) 2 % Apply 1 application topically 2 (two) times daily. (Patient not taking: No sig reported)  . [DISCONTINUED] traZODone (DESYREL) 50 MG tablet Take 0.5-1 tablets (25-50 mg total) by mouth at bedtime as needed for sleep.  (Patient not taking: Reported on 05/30/2020)  . [DISCONTINUED] valACYclovir (VALTREX) 1000 MG tablet Take 1 tablet (1,000 mg total) by mouth daily. (Patient not taking: No sig reported)   No facility-administered medications prior to visit.    Review of Systems  Constitutional: Positive for diaphoresis. Negative for appetite change, chills, fatigue and fever.  Eyes:       Eye twitching x 1 month  Respiratory: Negative for chest tightness and shortness of breath.   Cardiovascular: Negative for chest pain and palpitations.  Gastrointestinal: Negative for abdominal pain, nausea and vomiting.  Skin: Positive for rash.  Neurological: Negative for dizziness and weakness.       Objective    BP 134/83 (BP Location: Left Arm, Patient Position: Sitting, Cuff Size: Large)   Pulse 73   Resp 16   Wt 215 lb (97.5 kg)   BMI 35.78 kg/m     Physical Exam   Several scattered papular lesions on both arms and legs with surrounding excoriations. No burrows. Moderate fascial acne.     Assessment & Plan     1. Papular urticaria  - triamcinolone cream (KENALOG) 0.5 %; Apply to affected area 2-3 times daily  Dispense: 15 g; Refill: 1  2. Acne vulgaris She can try OTC adapalene and benzoyl peroxide. Follow up dermatology as scheduled.          The entirety of the information documented in the History of Present Illness, Review of Systems and Physical Exam were personally obtained by me. Portions of this information were initially documented by the  CMA and reviewed by me for thoroughness and accuracy.      Lelon Huh, MD  [facility] [phone] (phone) [phone] (fax)  [place]

## 2020-05-30 NOTE — Patient Instructions (Addendum)
.   You can use a combination of Adapalene cream and and benzoyl peroxide cream until you see your dermatologist in June

## 2020-06-21 ENCOUNTER — Other Ambulatory Visit: Payer: Self-pay

## 2020-06-21 ENCOUNTER — Ambulatory Visit (INDEPENDENT_AMBULATORY_CARE_PROVIDER_SITE_OTHER): Payer: Medicaid Other | Admitting: Dermatology

## 2020-06-21 DIAGNOSIS — L700 Acne vulgaris: Secondary | ICD-10-CM | POA: Diagnosis not present

## 2020-06-21 DIAGNOSIS — L0100 Impetigo, unspecified: Secondary | ICD-10-CM | POA: Diagnosis not present

## 2020-06-21 MED ORDER — DOXYCYCLINE HYCLATE 100 MG PO TABS: ORAL_TABLET | ORAL | 0 refills | Status: DC

## 2020-06-21 NOTE — Progress Notes (Signed)
   New Patient Visit  Subjective  Michele Bailey is a 25 y.o. female who presents for the following: Acne (Has been flaring since 2020 - she was using Tretinoin 0.1 gel but it dried her skin out too bad, and Mupirocin 2% ointment caused her face to itch. She is currently using OTC Differin 0.1% gel daily and a BP face wash).  The following portions of the chart were reviewed this encounter and updated as appropriate:   Tobacco  Allergies  Meds  Problems  Med Hx  Surg Hx  Fam Hx      Review of Systems:  No other skin or systemic complaints except as noted in HPI or Assessment and Plan.  Objective  Well appearing patient in no apparent distress; mood and affect are within normal limits.  A focused examination was performed including the face. Relevant physical exam findings are noted in the Assessment and Plan.  Face Crusty patches on the lower face.   Assessment & Plan  Acne vulgaris Face Chronic and persistent. Continue BP wash daily. Start CLN acne wash QD. Will re-evaluate treatment once impetigo is under control.  Impetigo Face Chronic and persistent/recurrent. Discussed with patient infectious nature - may spread to other parts of the body and other people. Wash hands thoroughly after touching and through away mask after using it for the day.  Start Doxycycline 100mg  po BID x 10 days. Then decrease to QD with evening meal. Doxycycline should be taken with food to prevent nausea. Do not lay down for 30 minutes after taking. Be cautious with sun exposure and use good sun protection while on this medication. Pregnant women should not take this medication.   Start CLN acne wash daily. Continue OTC BP wash daily.   doxycycline (VIBRA-TABS) 100 MG tablet - Face Take one tab po BID x 10 days. Then decrease to once daily thereafter. Take with food.  Return in about 5 weeks (around 07/26/2020) for impetigo follow up, discuss acne treatment.  Luther Redo, CMA, am acting as  scribe for Sarina Ser, MD .  Documentation: I have reviewed the above documentation for accuracy and completeness, and I agree with the above.  Sarina Ser, MD

## 2020-06-21 NOTE — Patient Instructions (Signed)

## 2020-06-23 ENCOUNTER — Encounter: Payer: Self-pay | Admitting: Dermatology

## 2020-07-27 ENCOUNTER — Ambulatory Visit: Payer: Medicaid Other | Admitting: Family Medicine
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
## 2020-08-02 ENCOUNTER — Ambulatory Visit: Payer: Medicaid Other | Admitting: Dermatology

## 2020-10-26 ENCOUNTER — Telehealth: Payer: Self-pay

## 2020-10-26 NOTE — Telephone Encounter (Signed)
Copied from [place] [id number]. Topic: Appointment Scheduling - Scheduling Inquiry for Clinic >> Oct 26, 2020 12:23 PM Greggory Keen D wrote: Reason for CRM: t called saying she thinks she has a uti and wants to be seen asap.  CB#  [phone]

## 2020-10-27 NOTE — Telephone Encounter (Signed)
Left message on patient's VM to seek care at an Urgent Care since we do not have any openings today.

## 2020-10-31 ENCOUNTER — Ambulatory Visit (INDEPENDENT_AMBULATORY_CARE_PROVIDER_SITE_OTHER): Payer: Medicaid Other | Admitting: Family Medicine

## 2020-10-31 ENCOUNTER — Ambulatory Visit: Payer: Medicaid Other | Admitting: Family Medicine

## 2020-10-31 ENCOUNTER — Other Ambulatory Visit: Payer: Self-pay

## 2020-10-31 ENCOUNTER — Encounter: Payer: Self-pay | Admitting: Family Medicine

## 2020-10-31 VITALS — BP 139/99 | HR 79 | Temp 96.8°F | Wt 210.9 lb

## 2020-10-31 DIAGNOSIS — R350 Frequency of micturition: Secondary | ICD-10-CM | POA: Insufficient documentation

## 2020-10-31 DIAGNOSIS — O2340 Unspecified infection of urinary tract in pregnancy, unspecified trimester: Secondary | ICD-10-CM | POA: Diagnosis not present

## 2020-10-31 DIAGNOSIS — N3090 Cystitis, unspecified without hematuria: Secondary | ICD-10-CM | POA: Insufficient documentation

## 2020-10-31 DIAGNOSIS — R030 Elevated blood-pressure reading, without diagnosis of hypertension: Secondary | ICD-10-CM | POA: Insufficient documentation

## 2020-10-31 HISTORY — DX: Elevated blood-pressure reading, without diagnosis of hypertension: R03.0

## 2020-10-31 HISTORY — DX: Cystitis, unspecified without hematuria: N30.90

## 2020-10-31 LAB — POCT URINALYSIS DIPSTICK
Bilirubin, UA: NEGATIVE
Blood, UA: NEGATIVE
Glucose, UA: NEGATIVE
Ketones, UA: NEGATIVE
Nitrite, UA: NEGATIVE
Protein, UA: NEGATIVE
Spec Grav, UA: 1.025 (ref 1.010–1.025)
Urobilinogen, UA: 0.2 E.U./dL
pH, UA: 6 (ref 5.0–8.0)

## 2020-10-31 LAB — POCT URINE PREGNANCY: Preg Test, Ur: NEGATIVE

## 2020-10-31 MED ORDER — SULFAMETHOXAZOLE-TRIMETHOPRIM 800-160 MG PO TABS: 1.0000 | ORAL_TABLET | Freq: Two times a day (BID) | ORAL | 0 refills | Status: DC

## 2020-10-31 NOTE — Assessment & Plan Note (Signed)
BP elevated today; not necessary to start medication Continue to monitor

## 2020-10-31 NOTE — Assessment & Plan Note (Signed)
Pt request for urine pregnancy screening given frequent UTI when pregnant POC pregnancy negative Patient report of missing some OCPs- encouraged to use barrier method if missed OCPs

## 2020-10-31 NOTE — Assessment & Plan Note (Signed)
Recommend increase fluid to help flush kidneys PH elevated- could be AZO use

## 2020-10-31 NOTE — Assessment & Plan Note (Signed)
5 day hx Has been taking AZO Will tx given Leukocytes and Positive CVA tenderness; continue to report s/s if they arise

## 2020-10-31 NOTE — Progress Notes (Signed)
Established patient visit   Patient: Michele Bailey   DOB: January 12, 1996   25 y.o. Female  MRN: [id number] Visit Date: 10/31/2020  Today's healthcare provider: Gwyneth Sprout, FNP   Chief Complaint  Patient presents with   Urinary Frequency   Subjective    Urinary Frequency  This is a new problem. The current episode started in the past 7 days. The problem occurs every urination. The problem has been unchanged. There has been no fever. She is Sexually active. Associated symptoms include chills, a discharge (clear in color patient reports), flank pain, frequency and hesitancy. Pertinent negatives include no hematuria, nausea, possible pregnancy, sweats, urgency or vomiting. Treatments tried: otc Azo. The treatment provided moderate relief.       Medications: Outpatient Medications Prior to Visit  Medication Sig   albuterol (VENTOLIN HFA) 108 (90 Base) MCG/ACT inhaler Inhale 2 puffs into the lungs every 6 (six) hours as needed for wheezing or shortness of breath.   cyclobenzaprine (FLEXERIL) 5 MG tablet Take 1 tablet (5 mg total) by mouth at bedtime.   Norgestimate-Ethinyl Estradiol Triphasic (ORTHO TRI-CYCLEN, 28,) 0.18/0.215/0.25 MG-35 MCG tablet Take 1 tablet by mouth daily.   Prenatal Vit-Fe Fumarate-FA (PRENATAL MULTIVITAMIN) TABS tablet Take 1 tablet by mouth daily at 12 noon.   [DISCONTINUED] doxycycline (VIBRA-TABS) 100 MG tablet Take one tab po BID x 10 days. Then decrease to once daily thereafter. Take with food.   [DISCONTINUED] triamcinolone cream (KENALOG) 0.5 % Apply to affected area 2-3 times daily   No facility-administered medications prior to visit.    Review of Systems  Constitutional:  Positive for chills.  Gastrointestinal:  Negative for nausea and vomiting.  Genitourinary:  Positive for flank pain, frequency and hesitancy. Negative for hematuria and urgency.      Objective    BP (!) 139/99   Pulse 79   Temp (!) 96.8 F (36 C) (Oral)   Wt 210 lb  14.4 oz (95.7 kg)   LMP 10/11/2020 (Exact Date)   BMI 35.10 kg/m    Physical Exam Vitals and nursing note reviewed.  Constitutional:      General: She is not in acute distress.    Appearance: Normal appearance. She is obese. She is not ill-appearing, toxic-appearing or diaphoretic.  HENT:     Head: Normocephalic and atraumatic.  Cardiovascular:     Rate and Rhythm: Normal rate and regular rhythm.     Pulses: Normal pulses.     Heart sounds: Normal heart sounds. No murmur heard.   No friction rub. No gallop.  Pulmonary:     Effort: Pulmonary effort is normal. No respiratory distress.     Breath sounds: Normal breath sounds. No stridor. No wheezing, rhonchi or rales.  Chest:     Chest wall: No tenderness.  Abdominal:     General: Bowel sounds are normal.     Palpations: Abdomen is soft.     Tenderness: There is left CVA tenderness. There is no right CVA tenderness.  Musculoskeletal:        General: No swelling, tenderness, deformity or signs of injury. Normal range of motion.     Right lower leg: No edema.     Left lower leg: No edema.  Skin:    General: Skin is warm and dry.     Capillary Refill: Capillary refill takes less than 2 seconds.     Coloration: Skin is not jaundiced or pale.     Findings: No bruising, erythema,  lesion or rash.  Neurological:     General: No focal deficit present.     Mental Status: She is alert and oriented to person, place, and time. Mental status is at baseline.     Cranial Nerves: No cranial nerve deficit.     Sensory: No sensory deficit.     Motor: No weakness.     Coordination: Coordination normal.  Psychiatric:        Mood and Affect: Mood normal.        Behavior: Behavior normal.        Thought Content: Thought content normal.        Judgment: Judgment normal.     Results for orders placed or performed in visit on 10/31/20  POCT urinalysis dipstick  Result Value Ref Range   Color, UA dark yellow    Clarity, UA clear    Glucose,  UA Negative Negative   Bilirubin, UA negative    Ketones, UA negative    Spec Grav, UA 1.025 1.010 - 1.025   Blood, UA negative    pH, UA 6.0 5.0 - 8.0   Protein, UA Negative Negative   Urobilinogen, UA 0.2 0.2 or 1.0 E.U./dL   Nitrite, UA negative    Leukocytes, UA Trace (A) Negative   Appearance     Odor    POCT urine pregnancy  Result Value Ref Range   Preg Test, Ur Negative Negative    Assessment & Plan     Problem List Items Addressed This Visit       Genitourinary   Cystitis - Primary    5 day hx Has been taking AZO Will tx given Leukocytes and Positive CVA tenderness; continue to report s/s if they arise      Relevant Medications   sulfamethoxazole-trimethoprim (BACTRIM DS) 800-160 MG tablet   Urinary tract infection in mother during pregnancy, antepartum    Pt request for urine pregnancy screening given frequent UTI when pregnant POC pregnancy negative Patient report of missing some OCPs- encouraged to use barrier method if missed OCPs      Relevant Medications   sulfamethoxazole-trimethoprim (BACTRIM DS) 800-160 MG tablet     Other   Urinary frequency    Recommend increase fluid to help flush kidneys PH elevated- could be AZO use      Relevant Orders   POCT urinalysis dipstick (Completed)   POCT urine pregnancy (Completed)   Urine Culture   Elevated blood pressure reading in office without diagnosis of hypertension    BP elevated today; not necessary to start medication Continue to monitor        Return if symptoms worsen or fail to improve.      Vonna Kotyk, FNP, have reviewed all documentation for this visit. The documentation on 10/31/20 for the exam, diagnosis, procedures, and orders are all accurate and complete.    Gwyneth Sprout, [place] [phone] (phone) [phone] (fax)  [place]

## 2020-11-03 LAB — URINE CULTURE

## 2021-02-19 ENCOUNTER — Ambulatory Visit: Payer: Medicaid Other | Admitting: Family Medicine

## 2021-04-04 ENCOUNTER — Ambulatory Visit: Payer: Medicaid Other | Admitting: Family Medicine

## 2021-04-04 NOTE — Progress Notes (Deleted)
?  ? ? ?  Established patient visit ? ? ?Patient: Michele Bailey   DOB: 1995/04/06   25 y.o. Female  MRN: [id number] ?Visit Date: 04/04/2021 ? ?Today's healthcare provider: Gwyneth Sprout, FNP  ? ?No chief complaint on file. ? ?Subjective  ?  ?HPI  ?Patient is a 26 year old female who presents requesting a pregnancy test.   ? ?Medications: ?Outpatient Medications Prior to Visit  ?Medication Sig  ? albuterol (VENTOLIN HFA) 108 (90 Base) MCG/ACT inhaler Inhale 2 puffs into the lungs every 6 (six) hours as needed for wheezing or shortness of breath.  ? cyclobenzaprine (FLEXERIL) 5 MG tablet Take 1 tablet (5 mg total) by mouth at bedtime.  ? Norgestimate-Ethinyl Estradiol Triphasic (ORTHO TRI-CYCLEN, 28,) 0.18/0.215/0.25 MG-35 MCG tablet Take 1 tablet by mouth daily.  ? Prenatal Vit-Fe Fumarate-FA (PRENATAL MULTIVITAMIN) TABS tablet Take 1 tablet by mouth daily at 12 noon.  ? sulfamethoxazole-trimethoprim (BACTRIM DS) 800-160 MG tablet Take 1 tablet by mouth 2 (two) times daily.  ? ?No facility-administered medications prior to visit.  ? ? ?Review of Systems ? ?{Labs  Heme  Chem  Endocrine  Serology  Results Review (optional):23779} ?  Objective  ?  ?There were no vitals taken for this visit. ?{Show previous vital signs (optional):23777} ? ?Physical Exam  ?*** ? ?No results found for any visits on 04/04/21. ? Assessment & Plan  ?  ? ?*** ? ?No follow-ups on file.  ?   ? ?{provider attestation***:1} ? ? ?Gwyneth Sprout, FNP  ?[place] ?[phone] (phone) ?[phone] (fax) ? ?[facility] ?

## 2021-04-18 ENCOUNTER — Telehealth: Payer: Self-pay | Admitting: *Deleted

## 2021-04-18 NOTE — Telephone Encounter (Signed)
Copied from [place] [id number]. Topic: Referral - Request for Referral ?>> Apr 18, 2021  4:48 PM Erick Blinks wrote: ?Has patient seen PCP for this complaint? No. ?*If NO, is insurance requiring patient see PCP for this issue before PCP can refer them? ?Referral for which specialty: Breast Exam  ?Preferred provider/office: [facility]  ?Reason for referral: Needs breast exam she says ? ?Please advise if patient needs appt ?

## 2021-04-19 NOTE — Telephone Encounter (Signed)
For this issue she is requesting referral for shouldn't she be evaluated in office first? Please review. KW ?

## 2021-04-19 NOTE — Telephone Encounter (Signed)
Unable to rach patient voicemail box is full, if patient returns call okay for [facility] nurse triage to advise office visit is needed for further evaluation.KW ?

## 2021-04-20 ENCOUNTER — Ambulatory Visit: Payer: Medicaid Other | Admitting: Family Medicine

## 2021-04-20 ENCOUNTER — Encounter: Payer: Self-pay | Admitting: Family Medicine

## 2021-04-20 VITALS — BP 125/86 | HR 68 | Temp 97.0°F | Resp 16 | Wt 217.1 lb

## 2021-04-20 DIAGNOSIS — N644 Mastodynia: Secondary | ICD-10-CM

## 2021-04-20 DIAGNOSIS — J4530 Mild persistent asthma, uncomplicated: Secondary | ICD-10-CM

## 2021-04-20 HISTORY — DX: Mastodynia: N64.4

## 2021-04-20 MED ORDER — IPRATROPIUM-ALBUTEROL 0.5-2.5 (3) MG/3ML IN SOLN: 3.0000 mL | Freq: Four times a day (QID) | RESPIRATORY_TRACT | 11 refills | Status: DC

## 2021-04-20 MED ORDER — SODIUM CHLORIDE 0.9 % IN NEBU: 3.0000 mL | INHALATION_SOLUTION | RESPIRATORY_TRACT | 12 refills | Status: DC | PRN

## 2021-04-20 NOTE — Assessment & Plan Note (Signed)
Acute cystic concern, with pain ?Will send for [place] ?Large arc with associated pain 4cm x 2 cm- 9-2 o clock ?

## 2021-04-20 NOTE — Progress Notes (Signed)
?  ? ?[employer] as a Education administrator for Gwyneth Sprout, FNP.,have documented all relevant documentation on the behalf of Gwyneth Sprout, FNP,as directed by  Gwyneth Sprout, FNP while in the presence of Gwyneth Sprout, FNP.  ? ?Established patient visit ? ? ?Patient: Michele Bailey   DOB: Sep 06, 1995   26 y.o. Female  MRN: [id number] ?Visit Date: 04/20/2021 ? ?Today's healthcare provider: Gwyneth Sprout, FNP  ? ?Re Introduced to nurse practitioner role and practice setting.  All questions answered.  Discussed provider/patient relationship and expectations. ? ? ?Chief Complaint  ?Patient presents with  ? Breast Pain  ?  Patient comes in office today with concerns of pain upper right breast above the areola, patient states that skin burned a little but has since subsided, patient denies any trauma to breast or feeling a mass/lump.   ? ?Subjective  ?  ?HPI ?HPI   ? ? Breast Pain   ? Additional comments: Patient comes in office today with concerns of pain upper right breast above the areola, patient states that skin burned a little but has since subsided, patient denies any trauma to breast or feeling a mass/lump.  ? ?  ?  ?Last edited by Minette Headland, CMA on 04/20/2021  3:00 PM.  ?  ?  ? ?Medications: ?Outpatient Medications Prior to Visit  ?Medication Sig  ? albuterol (VENTOLIN HFA) 108 (90 Base) MCG/ACT inhaler Inhale 2 puffs into the lungs every 6 (six) hours as needed for wheezing or shortness of breath.  ? cyclobenzaprine (FLEXERIL) 5 MG tablet Take 1 tablet (5 mg total) by mouth at bedtime.  ? Norgestimate-Ethinyl Estradiol Triphasic (ORTHO TRI-CYCLEN, 28,) 0.18/0.215/0.25 MG-35 MCG tablet Take 1 tablet by mouth daily.  ? Prenatal Vit-Fe Fumarate-FA (PRENATAL MULTIVITAMIN) TABS tablet Take 1 tablet by mouth daily at 12 noon.  ? [DISCONTINUED] sulfamethoxazole-trimethoprim (BACTRIM DS) 800-160 MG tablet Take 1 tablet by mouth 2 (two) times daily.  ? ?No facility-administered medications prior to visit.   ? ? ?Review of Systems ? ? ?  Objective  ?  ?BP 125/86   Pulse 68   Temp (!) 97 ?F (36.1 ?C) (Temporal)   Resp 16   Wt 217 lb 1.6 oz (98.5 kg)   LMP 04/04/2021 (Exact Date)   SpO2 100%   BMI 36.13 kg/m?  ? ? ?Physical Exam ?Vitals and nursing note reviewed.  ?Constitutional:   ?   General: She is not in acute distress. ?   Appearance: Normal appearance. She is obese. She is not ill-appearing, toxic-appearing or diaphoretic.  ?HENT:  ?   Head: Normocephalic and atraumatic.  ?Cardiovascular:  ?   Rate and Rhythm: Normal rate and regular rhythm.  ?   Pulses: Normal pulses.  ?   Heart sounds: Normal heart sounds. No murmur heard. ?  No friction rub. No gallop.  ?Pulmonary:  ?   Effort: Pulmonary effort is normal. No respiratory distress.  ?   Breath sounds: Normal breath sounds. No stridor. No wheezing, rhonchi or rales.  ?Chest:  ?   Chest wall: No tenderness.  ? ? ?Abdominal:  ?   General: Bowel sounds are normal.  ?   Palpations: Abdomen is soft.  ?Musculoskeletal:     ?   General: No swelling, tenderness, deformity or signs of injury. Normal range of motion.  ?   Right lower leg: No edema.  ?   Left lower leg: No edema.  ?Skin: ?   General: Skin  is warm and dry.  ?   Capillary Refill: Capillary refill takes less than 2 seconds.  ?   Coloration: Skin is not jaundiced or pale.  ?   Findings: No bruising, erythema, lesion or rash.  ?Neurological:  ?   General: No focal deficit present.  ?   Mental Status: She is alert and oriented to person, place, and time. Mental status is at baseline.  ?   Cranial Nerves: No cranial nerve deficit.  ?   Sensory: No sensory deficit.  ?   Motor: No weakness.  ?   Coordination: Coordination normal.  ?Psychiatric:     ?   Mood and Affect: Mood normal.     ?   Behavior: Behavior normal.     ?   Thought Content: Thought content normal.     ?   Judgment: Judgment normal.  ?  ? ?No results found for any visits on 04/20/21. ? Assessment & Plan  ?  ? ?Problem List Items Addressed This  Visit   ? ?  ? Other  ? Breast pain, right - Primary  ?  Acute cystic concern, with pain ?Will send for [place] ?Large arc with associated pain 4cm x 2 cm- 9-2 o clock ?  ?  ? Relevant Orders  ? US BREAST LTD UNI RIGHT INC AXILLA  ? ?Other Visit Diagnoses   ? ? Mild persistent asthma without complication      ? Relevant Medications  ? sodium chloride 0.9 % nebulizer solution  ? ipratropium-albuterol (DUONEB) 0.5-2.5 (3) MG/3ML SOLN  ? Other Relevant Orders  ? For home use only DME Nebulizer machine  ? ?  ? ?Return in about 4 weeks (around 05/18/2021) for asthma control.  ?   ? ?I, Gwyneth Sprout, FNP, have reviewed all documentation for this visit. The documentation on 04/20/21 for the exam, diagnosis, procedures, and orders are all accurate and complete. ? ?Gwyneth Sprout, FNP  ?[place] ?[phone] (phone) ?[phone] (fax) ? ?[facility] ?

## 2021-04-25 ENCOUNTER — Ambulatory Visit
Admission: RE | Admit: 2021-04-25 | Discharge: 2021-04-25 | Disposition: A | Discharge status: Home / Self Care | Payer: Medicaid Other | Source: Ambulatory Visit | Attending: Family Medicine | Admitting: Family Medicine

## 2021-04-25 DIAGNOSIS — N644 Mastodynia: Secondary | ICD-10-CM | POA: Diagnosis present

## 2021-06-20 ENCOUNTER — Ambulatory Visit: Payer: Medicaid Other | Admitting: Family Medicine

## 2021-06-20 NOTE — Progress Notes (Deleted)
      Established patient visit   Patient: Michele Bailey   DOB: Feb 17, 1995   26 y.o. Female  MRN: [id number] Visit Date: 06/20/2021  Today's healthcare provider: Gwyneth Sprout, FNP   No chief complaint on file.  Subjective    HPI  Urinary symptoms  She reports {chronicity:119221} {urinary symptoms:765916}. The current episode started {onset initial:119223} and is {progression:119226}. Patient states symptoms are {severity:119268} in intensity, occurring {frequency of symptoms:119294}. She  {recent treatment:18834} been recently treated for similar symptoms.    Associated symptoms: {Yes/No:20286} abdominal pain {Yes/No:20286} back pain  {Yes/No:20286} chills {Yes/No:20286} constipation  {Yes/No:20286} cramping {Yes/No:20286} diarrhea  {Yes/No:20286} discharge {Yes/No:20286} fever  {Yes/No:20286} hematuria {Yes/No:20286} nausea  {Yes/No:20286} vomiting    ---------------------------------------------------------------------------------------   Medications: Outpatient Medications Prior to Visit  Medication Sig   albuterol (VENTOLIN HFA) 108 (90 Base) MCG/ACT inhaler Inhale 2 puffs into the lungs every 6 (six) hours as needed for wheezing or shortness of breath.   cyclobenzaprine (FLEXERIL) 5 MG tablet Take 1 tablet (5 mg total) by mouth at bedtime.   ipratropium-albuterol (DUONEB) 0.5-2.5 (3) MG/3ML SOLN Take 3 mLs by nebulization in the morning, at noon, in the evening, and at bedtime.   Norgestimate-Ethinyl Estradiol Triphasic (ORTHO TRI-CYCLEN, 28,) 0.18/0.215/0.25 MG-35 MCG tablet Take 1 tablet by mouth daily.   Prenatal Vit-Fe Fumarate-FA (PRENATAL MULTIVITAMIN) TABS tablet Take 1 tablet by mouth daily at 12 noon.   sodium chloride 0.9 % nebulizer solution Take 3 mLs by nebulization as needed for wheezing.   No facility-administered medications prior to visit.    Review of Systems  {Labs  Heme  Chem  Endocrine  Serology  Results Review (optional):23779}    Objective    There were no vitals taken for this visit. {Show previous vital signs (optional):23777}  Physical Exam  ***  No results found for any visits on 06/20/21.  Assessment & Plan     ***  No follow-ups on file.      {provider attestation***:1}   Gwyneth Sprout, [place] [phone] (phone) [phone] (fax)  [place]

## 2021-08-20 ENCOUNTER — Other Ambulatory Visit (HOSPITAL_COMMUNITY)
Admission: RE | Admit: 2021-08-20 | Discharge: 2021-08-20 | Disposition: A | Discharge status: Home / Self Care | Payer: Medicaid Other | Source: Ambulatory Visit | Attending: Family Medicine | Admitting: Family Medicine

## 2021-08-20 ENCOUNTER — Ambulatory Visit (INDEPENDENT_AMBULATORY_CARE_PROVIDER_SITE_OTHER): Payer: Medicaid Other | Admitting: Family Medicine

## 2021-08-20 ENCOUNTER — Encounter: Payer: Self-pay | Admitting: Family Medicine

## 2021-08-20 VITALS — BP 123/83 | HR 72 | Temp 98.2°F | Resp 16 | Ht 64.0 in | Wt 218.0 lb

## 2021-08-20 DIAGNOSIS — Z3202 Encounter for pregnancy test, result negative: Secondary | ICD-10-CM | POA: Diagnosis not present

## 2021-08-20 DIAGNOSIS — R300 Dysuria: Secondary | ICD-10-CM | POA: Insufficient documentation

## 2021-08-20 DIAGNOSIS — L659 Nonscarring hair loss, unspecified: Secondary | ICD-10-CM | POA: Insufficient documentation

## 2021-08-20 DIAGNOSIS — Z7251 High risk heterosexual behavior: Secondary | ICD-10-CM | POA: Diagnosis not present

## 2021-08-20 DIAGNOSIS — Z01419 Encounter for gynecological examination (general) (routine) without abnormal findings: Secondary | ICD-10-CM | POA: Diagnosis not present

## 2021-08-20 DIAGNOSIS — F430 Acute stress reaction: Secondary | ICD-10-CM | POA: Diagnosis not present

## 2021-08-20 DIAGNOSIS — O2340 Unspecified infection of urinary tract in pregnancy, unspecified trimester: Secondary | ICD-10-CM | POA: Diagnosis not present

## 2021-08-20 DIAGNOSIS — Z13220 Encounter for screening for lipoid disorders: Secondary | ICD-10-CM | POA: Insufficient documentation

## 2021-08-20 HISTORY — DX: Nonscarring hair loss, unspecified: L65.9

## 2021-08-20 LAB — POCT URINALYSIS DIPSTICK
Blood, UA: NEGATIVE
Glucose, UA: NEGATIVE
Ketones, UA: NEGATIVE
Leukocytes, UA: NEGATIVE
Nitrite, UA: NEGATIVE
Protein, UA: NEGATIVE
Spec Grav, UA: 1.015 (ref 1.010–1.025)
Urobilinogen, UA: 0.2 E.U./dL
pH, UA: 6.5 (ref 5.0–8.0)

## 2021-08-20 LAB — POCT URINE PREGNANCY: Preg Test, Ur: NEGATIVE

## 2021-08-20 MED ORDER — CLONAZEPAM 0.5 MG PO TABS: 0.5000 mg | ORAL_TABLET | Freq: Every day | ORAL | 0 refills | Status: DC | PRN

## 2021-08-20 NOTE — Assessment & Plan Note (Signed)
Check LP recommend diet low in saturated fat and regular exercise - 30 min at least 5 times per week

## 2021-08-20 NOTE — Assessment & Plan Note (Signed)
Reports frequent unprotected sexual encounters; last one 5 days ago Associated with vaginal itching and dysuria since

## 2021-08-20 NOTE — Progress Notes (Signed)
Established patient visit   Patient: Michele Bailey   DOB: 05/30/1995   26 y.o. Female  MRN: [id number] Visit Date: 08/20/2021  Today's healthcare provider: Gwyneth Sprout, FNP  Introduced to nurse practitioner role and practice setting.  All questions answered.  Discussed provider/patient relationship and expectations.   I,Tiffany J Bragg,acting as a scribe for Gwyneth Sprout, FNP.,have documented all relevant documentation on the behalf of Gwyneth Sprout, FNP,as directed by  Gwyneth Sprout, FNP while in the presence of Gwyneth Sprout, FNP.   Chief Complaint  Patient presents with   Gynecologic Exam    Patient complains of discomfort with urination and vaginal itching starting last week. Patient states she is not on birth control and wants a pregnancy test.    Subjective    HPI HPI     Gynecologic Exam    Additional comments: Patient complains of discomfort with urination and vaginal itching starting last week. Patient states she is not on birth control and wants a pregnancy test.       Last edited by Smitty Knudsen, CMA on 08/20/2021  1:23 PM.      Medications: Outpatient Medications Prior to Visit  Medication Sig   albuterol (VENTOLIN HFA) 108 (90 Base) MCG/ACT inhaler Inhale 2 puffs into the lungs every 6 (six) hours as needed for wheezing or shortness of breath.   cyclobenzaprine (FLEXERIL) 5 MG tablet Take 1 tablet (5 mg total) by mouth at bedtime.   ipratropium-albuterol (DUONEB) 0.5-2.5 (3) MG/3ML SOLN Take 3 mLs by nebulization in the morning, at noon, in the evening, and at bedtime.   Prenatal Vit-Fe Fumarate-FA (PRENATAL MULTIVITAMIN) TABS tablet Take 1 tablet by mouth daily at 12 noon.   sodium chloride 0.9 % nebulizer solution Take 3 mLs by nebulization as needed for wheezing.   Norgestimate-Ethinyl Estradiol Triphasic (ORTHO TRI-CYCLEN, 28,) 0.18/0.215/0.25 MG-35 MCG tablet Take 1 tablet by mouth daily. (Patient not taking: Reported on 08/20/2021)   No  facility-administered medications prior to visit.   Review of Systems  Last CBC Lab Results  Component Value Date   WBC 10.3 05/13/2019   HGB 14.8 05/13/2019   HCT 43.0 05/13/2019   MCV 92 05/13/2019   MCH 31.6 05/13/2019   RDW 12.6 05/13/2019   PLT 422 60/10/9321   Last metabolic panel Lab Results  Component Value Date   GLUCOSE 87 05/13/2019   NA 144 05/13/2019   K 3.7 05/13/2019   CL 103 05/13/2019   CO2 19 (L) 05/13/2019   BUN 11 05/13/2019   CREATININE 0.92 05/13/2019   GFRNONAA 87 05/13/2019   CALCIUM 10.1 05/13/2019   PHOS 4.0 10/03/2016   PROT 8.1 05/13/2019   ALBUMIN 4.9 05/13/2019   LABGLOB 3.2 05/13/2019   AGRATIO 1.5 05/13/2019   BILITOT 0.4 05/13/2019   ALKPHOS 103 05/13/2019   AST 18 05/13/2019   ALT 14 05/13/2019   ANIONGAP 8 02/03/2018   Last thyroid functions Lab Results  Component Value Date   TSH 1.690 05/13/2019     Objective    BP 123/83 (BP Location: Left Arm, Patient Position: Sitting, Cuff Size: Normal)   Pulse 72   Temp 98.2 F (36.8 C) (Oral)   Resp 16   Ht '5\' 4"'$  (1.626 m)   Wt 218 lb (98.9 kg)   LMP 07/30/2021 (Exact Date)   SpO2 98%   BMI 37.42 kg/m   BP Readings from Last 3 Encounters:  08/20/21 123/83  04/20/21  125/86  10/31/20 (!) 139/99   Wt Readings from Last 3 Encounters:  08/20/21 218 lb (98.9 kg)  04/20/21 217 lb 1.6 oz (98.5 kg)  10/31/20 210 lb 14.4 oz (95.7 kg)   SpO2 Readings from Last 3 Encounters:  08/20/21 98%  04/20/21 100%  12/24/19 100%   Physical Exam Vitals and nursing note reviewed.  Constitutional:      General: She is not in acute distress.    Appearance: Normal appearance. She is obese. She is not ill-appearing, toxic-appearing or diaphoretic.  HENT:     Head: Normocephalic and atraumatic.  Cardiovascular:     Rate and Rhythm: Normal rate and regular rhythm.     Pulses: Normal pulses.  Pulmonary:     Effort: Pulmonary effort is normal.  Abdominal:     General: Bowel sounds are  normal.     Palpations: Abdomen is soft.  Genitourinary:    General: Normal vulva.     Tanner stage (genital): 5.     Vagina: Vaginal discharge present.     Cervix: Normal and dilated.     Comments: Denied internal exam Musculoskeletal:        General: No swelling, tenderness, deformity or signs of injury. Normal range of motion.     Right lower leg: No edema.     Left lower leg: No edema.  Skin:    General: Skin is warm and dry.     Capillary Refill: Capillary refill takes less than 2 seconds.     Coloration: Skin is not jaundiced or pale.     Findings: No bruising, erythema, lesion or rash.  Neurological:     General: No focal deficit present.     Mental Status: She is alert and oriented to person, place, and time. Mental status is at baseline.     Cranial Nerves: No cranial nerve deficit.     Sensory: No sensory deficit.     Motor: No weakness.     Coordination: Coordination normal.  Psychiatric:        Mood and Affect: Mood normal.        Behavior: Behavior normal.        Thought Content: Thought content normal.        Judgment: Judgment normal.      Results for orders placed or performed in visit on 08/20/21  POCT Urinalysis Dipstick  Result Value Ref Range   Color, UA yellow    Clarity, UA clear    Glucose, UA Negative Negative   Bilirubin, UA small    Ketones, UA negative    Spec Grav, UA 1.015 1.010 - 1.025   Blood, UA negative    pH, UA 6.5 5.0 - 8.0   Protein, UA Negative Negative   Urobilinogen, UA 0.2 0.2 or 1.0 E.U./dL   Nitrite, UA negative    Leukocytes, UA Negative Negative   Appearance     Odor    POCT urine pregnancy  Result Value Ref Range   Preg Test, Ur Negative Negative    Assessment & Plan     Problem List Items Addressed This Visit       Genitourinary   RESOLVED: Urinary tract infection in mother during pregnancy, antepartum     Other   Dysuria - Primary    Acute, reports change in vaginal discharge and recent fish like  odor Recommend use of STI with PAP results       Relevant Orders   POCT Urinalysis Dipstick (Completed)  POCT urine pregnancy (Completed)   Urine Culture   Encounter for annual routine gynecological examination    Repeat PAP per pt request       Relevant Orders   Cytology - PAP   Hair loss    Acute on chronic, check vitamin labs and thyroid endorses excess stress lately      Relevant Orders   Comprehensive Metabolic Panel (CMET)   CBC with Differential/Platelet   TSH + free T4   Vitamin D (25 hydroxy)   B12 and Folate Panel   Screening, lipid    Check LP recommend diet low in saturated fat and regular exercise - 30 min at least 5 times per week       Relevant Orders   Lipid panel   Stress reaction    Reports new stressors related to starting new job, child starting school, and moving 30# klonopin provided; PDMP reviewed Encourage patient to start daily medication and seek therapy       Relevant Medications   clonazePAM (KLONOPIN) 0.5 MG tablet   Unprotected sex    Reports frequent unprotected sexual encounters; last one 5 days ago Associated with vaginal itching and dysuria since       Relevant Orders   POCT urine pregnancy (Completed)     Return in about 6 months (around 02/20/2022) for annual examination.      Vonna Kotyk, FNP, have reviewed all documentation for this visit. The documentation on 08/20/21 for the exam, diagnosis, procedures, and orders are all accurate and complete.    Gwyneth Sprout, [place] [phone] (phone) [phone] (fax)  [place]

## 2021-08-20 NOTE — Assessment & Plan Note (Deleted)
Reports concerns for UTI during pregnancy; has had unprotected intercourse 5 days prior; previously not using contraceptive

## 2021-08-20 NOTE — Assessment & Plan Note (Signed)
Acute on chronic, check vitamin labs and thyroid endorses excess stress lately

## 2021-08-20 NOTE — Assessment & Plan Note (Signed)
Reports new stressors related to starting new job, child starting school, and moving 30# klonopin provided; PDMP reviewed Encourage patient to start daily medication and seek therapy

## 2021-08-20 NOTE — Assessment & Plan Note (Signed)
Acute, reports change in vaginal discharge and recent fish like odor Recommend use of STI with PAP results

## 2021-08-20 NOTE — Assessment & Plan Note (Signed)
Repeat PAP per pt request

## 2021-08-21 ENCOUNTER — Telehealth: Payer: Self-pay

## 2021-08-21 LAB — TSH+FREE T4
Free T4: 0.99 ng/dL (ref 0.82–1.77)
TSH: 1.34 u[IU]/mL (ref 0.450–4.500)

## 2021-08-21 LAB — COMPREHENSIVE METABOLIC PANEL
ALT: 15 IU/L (ref 0–32)
AST: 21 IU/L (ref 0–40)
Albumin/Globulin Ratio: 1.7 (ref 1.2–2.2)
Albumin: 4.4 g/dL (ref 4.0–5.0)
Alkaline Phosphatase: 98 IU/L (ref 44–121)
BUN/Creatinine Ratio: 13 (ref 9–23)
BUN: 11 mg/dL (ref 6–20)
Bilirubin Total: 0.3 mg/dL (ref 0.0–1.2)
CO2: 23 mmol/L (ref 20–29)
Calcium: 9.5 mg/dL (ref 8.7–10.2)
Chloride: 105 mmol/L (ref 96–106)
Creatinine, Ser: 0.84 mg/dL (ref 0.57–1.00)
Globulin, Total: 2.6 g/dL (ref 1.5–4.5)
Glucose: 81 mg/dL (ref 70–99)
Potassium: 4.1 mmol/L (ref 3.5–5.2)
Sodium: 140 mmol/L (ref 134–144)
Total Protein: 7 g/dL (ref 6.0–8.5)
eGFR: 98 mL/min/{1.73_m2} (ref >59)

## 2021-08-21 LAB — CBC WITH DIFFERENTIAL/PLATELET
Basophils Absolute: 0.1 10*3/uL (ref 0.0–0.2)
Basos: 1 %
EOS (ABSOLUTE): 0.3 10*3/uL (ref 0.0–0.4)
Eos: 3 %
Hematocrit: 40.3 % (ref 34.0–46.6)
Hemoglobin: 13.8 g/dL (ref 11.1–15.9)
Immature Grans (Abs): 0 10*3/uL (ref 0.0–0.1)
Immature Granulocytes: 0 %
Lymphocytes Absolute: 2.9 10*3/uL (ref 0.7–3.1)
Lymphs: 30 %
MCH: 31.6 pg (ref 26.6–33.0)
MCHC: 34.2 g/dL (ref 31.5–35.7)
MCV: 92 fL (ref 79–97)
Monocytes Absolute: 0.7 10*3/uL (ref 0.1–0.9)
Monocytes: 7 %
Neutrophils Absolute: 5.9 10*3/uL (ref 1.4–7.0)
Neutrophils: 59 %
Platelets: 375 10*3/uL (ref 150–450)
RBC: 4.37 x10E6/uL (ref 3.77–5.28)
RDW: 11.6 % — ABNORMAL LOW (ref 11.7–15.4)
WBC: 9.9 10*3/uL (ref 3.4–10.8)

## 2021-08-21 LAB — LIPID PANEL
Chol/HDL Ratio: 3.1 ratio (ref 0.0–4.4)
Cholesterol, Total: 131 mg/dL (ref 100–199)
HDL: 42 mg/dL (ref >39)
LDL Chol Calc (NIH): 70 mg/dL (ref 0–99)
Triglycerides: 100 mg/dL (ref 0–149)
VLDL Cholesterol Cal: 19 mg/dL (ref 5–40)

## 2021-08-21 LAB — B12 AND FOLATE PANEL
Folate: 6.2 ng/mL (ref >3.0)
Vitamin B-12: 339 pg/mL (ref 232–1245)

## 2021-08-21 LAB — VITAMIN D 25 HYDROXY (VIT D DEFICIENCY, FRACTURES): Vit D, 25-Hydroxy: 45.2 ng/mL (ref 30.0–100.0)

## 2021-08-21 NOTE — Progress Notes (Signed)
Labs have been reviewed and are normal and/or stable.  Please let us know if you have any questions.  Thank you, Charlisha Market T Kahlan Engebretson, FNP  [facility] [address] [place], [place] [address] [phone] (phone) [phone] (fax) [facility] 

## 2021-08-21 NOTE — Telephone Encounter (Signed)
Pt called for labs results. Shared provider's note. Pt has forgotten "MyChart" User name and password. Pt would like a new link sent to her.   Gwyneth Sprout, FNP  08/21/2021  7:38 AM EDT     Labs have been reviewed and are normal and/or stable.   Please let us know if you have any questions.   Thank you, Gwyneth Sprout, [address] [place], [place] [address] [phone] (phone) [phone] (fax) [place]

## 2021-08-21 NOTE — Telephone Encounter (Signed)
Spoke with patient had gave her number to mychart so they can help her get into her chart.

## 2021-08-23 LAB — CYTOLOGY - PAP
Chlamydia: NEGATIVE
Comment: NEGATIVE
Comment: NEGATIVE
Comment: NEGATIVE
Comment: NORMAL
Diagnosis: NEGATIVE
HSV1: NEGATIVE
HSV2: NEGATIVE
High risk HPV: NEGATIVE
Neisseria Gonorrhea: NEGATIVE

## 2021-08-23 NOTE — Progress Notes (Signed)
Normal, negative PAP. Repeat in 3 years.  Gwyneth Sprout, [address] [place], [place] [address] [phone] (phone) [phone] (fax) [place]

## 2021-08-24 ENCOUNTER — Other Ambulatory Visit: Payer: Self-pay | Admitting: Family Medicine

## 2021-08-24 DIAGNOSIS — N760 Acute vaginitis: Secondary | ICD-10-CM | POA: Insufficient documentation

## 2021-08-24 DIAGNOSIS — A498 Other bacterial infections of unspecified site: Secondary | ICD-10-CM

## 2021-08-24 DIAGNOSIS — B9689 Other specified bacterial agents as the cause of diseases classified elsewhere: Secondary | ICD-10-CM | POA: Insufficient documentation

## 2021-08-24 DIAGNOSIS — B3731 Acute candidiasis of vulva and vagina: Secondary | ICD-10-CM | POA: Insufficient documentation

## 2021-08-24 HISTORY — DX: Other bacterial infections of unspecified site: A49.8

## 2021-08-24 LAB — URINE CULTURE

## 2021-08-24 LAB — SPECIMEN STATUS REPORT

## 2021-08-24 MED ORDER — METRONIDAZOLE 500 MG PO TABS: 500.0000 mg | ORAL_TABLET | Freq: Two times a day (BID) | ORAL | 0 refills | Status: AC

## 2021-08-24 MED ORDER — SULFAMETHOXAZOLE-TRIMETHOPRIM 800-160 MG PO TABS: 1.0000 | ORAL_TABLET | Freq: Two times a day (BID) | ORAL | 0 refills | Status: DC

## 2021-08-24 MED ORDER — FLUCONAZOLE 150 MG PO TABS: ORAL_TABLET | ORAL | 0 refills | Status: DC

## 2021-10-30 ENCOUNTER — Other Ambulatory Visit: Payer: Self-pay | Admitting: Family Medicine

## 2021-10-30 DIAGNOSIS — F430 Acute stress reaction: Secondary | ICD-10-CM

## 2021-10-30 NOTE — Telephone Encounter (Signed)
Medication Refill - Medication: clonazePAM (KLONOPIN) 0.5 MG tablet  Has the patient contacted their pharmacy? Yes.   Pt got it filled at another pharmacy last time  Preferred Pharmacy (with phone number or street name): [id number] - [place], [place]  Has the patient been seen for an appointment in the last year OR does the patient have an upcoming appointment? Yes.    Agent: Please be advised that RX refills may take up to 3 business days. We ask that you follow-up with your pharmacy.

## 2021-10-30 NOTE — Telephone Encounter (Signed)
Requested medications are due for refill today.  yes  Requested medications are on the active medications list.  yes  Last refill. 08/20/2021 #30 0 rf  Future visit scheduled.   no  Notes to clinic.  Refill not delegated.    Requested Prescriptions  Pending Prescriptions Disp Refills   clonazePAM (KLONOPIN) 0.5 MG tablet 30 tablet 0    Sig: Take 1 tablet (0.5 mg total) by mouth daily as needed for anxiety.     Not Delegated - Psychiatry: Anxiolytics/Hypnotics 2 Failed - 10/30/2021  1:25 PM      Failed - This refill cannot be delegated      Failed - Urine Drug Screen completed in last 360 days      Passed - Patient is not pregnant      Passed - Valid encounter within last 6 months    Recent Outpatient Visits           2 months ago [place], Elise T, FNP   6 months ago Breast pain, right   [facility] Gwyneth Sprout, FNP   12 months ago [place] Gwyneth Sprout, FNP   1 year ago Papular urticaria   [facility] Birdie Sons, MD   1 year ago Encounter for surveillance of contraceptives, unspecified contraceptive   [facility] [place], [place], [place]

## 2021-10-31 MED ORDER — CLONAZEPAM 0.5 MG PO TABS: 0.5000 mg | ORAL_TABLET | Freq: Every day | ORAL | 0 refills | Status: DC | PRN

## 2021-11-28 ENCOUNTER — Encounter: Payer: Self-pay | Admitting: Physician Assistant

## 2021-11-28 ENCOUNTER — Telehealth: Payer: Self-pay

## 2021-11-28 ENCOUNTER — Ambulatory Visit: Payer: Medicaid Other | Admitting: Physician Assistant

## 2021-11-28 VITALS — BP 121/89 | HR 78

## 2021-11-28 DIAGNOSIS — J069 Acute upper respiratory infection, unspecified: Secondary | ICD-10-CM

## 2021-11-28 DIAGNOSIS — Z3201 Encounter for pregnancy test, result positive: Secondary | ICD-10-CM

## 2021-11-28 DIAGNOSIS — N912 Amenorrhea, unspecified: Secondary | ICD-10-CM

## 2021-11-28 DIAGNOSIS — J4520 Mild intermittent asthma, uncomplicated: Secondary | ICD-10-CM | POA: Insufficient documentation

## 2021-11-28 LAB — POCT URINE PREGNANCY: Preg Test, Ur: POSITIVE — AB

## 2021-11-28 MED ORDER — ALBUTEROL SULFATE HFA 108 (90 BASE) MCG/ACT IN AERS: 2.0000 | INHALATION_SPRAY | Freq: Four times a day (QID) | RESPIRATORY_TRACT | 1 refills | Status: DC | PRN

## 2021-11-28 NOTE — Telephone Encounter (Signed)
Copied from [place] [id number]. Topic: General - Other >> Nov 28, 2021  8:36 AM [place] C wrote: Reason for CRM: The patient has called to request lab orders for a pregnancy test  The patient will be seen today 11/28/21 at 10 AM   Please contact the patient further when possible

## 2021-11-28 NOTE — Progress Notes (Signed)
I,Sha'taria Tyson,acting as a Education administrator for [employer], PA-C.,have documented all relevant documentation on the behalf of Michele Kirschner, PA-C,as directed by  Michele Kirschner, PA-C while in the presence of Michele Kirschner, PA-C.   Established patient visit   Patient: Michele Bailey   DOB: 07-01-1995   26 y.o. Female  MRN: [id number] Visit Date: 11/28/2021  Today's healthcare provider: [name], PA-C   Cc. Cough, nasal congestion x 7 days   Subjective     Pt reports cough with thick, sticky dark mucous, headaches, nasal congestion, rhinorrhea, occasional wheezing. Uses her albuterol inhaler PRN, lately daily. Denies fevers, chest pain, SOB. She reports smoking 2-3 cigarettes a day.   Pt reports her menstrual cycle is late, LMP 10/23/2021. Reports taking home pregnancy tests, two were inconclusive and one was possibly positive. She has been trying to conceive.   Medications: Outpatient Medications Prior to Visit  Medication Sig   clonazePAM (KLONOPIN) 0.5 MG tablet Take 1 tablet (0.5 mg total) by mouth daily as needed for anxiety.   Prenatal Vit-Fe Fumarate-FA (PRENATAL MULTIVITAMIN) TABS tablet Take 1 tablet by mouth daily at 12 noon.   [DISCONTINUED] albuterol (VENTOLIN HFA) 108 (90 Base) MCG/ACT inhaler Inhale 2 puffs into the lungs every 6 (six) hours as needed for wheezing or shortness of breath.   Norgestimate-Ethinyl Estradiol Triphasic (ORTHO TRI-CYCLEN, 28,) 0.18/0.215/0.25 MG-35 MCG tablet Take 1 tablet by mouth daily. (Patient not taking: Reported on 11/28/2021)   sodium chloride 0.9 % nebulizer solution Take 3 mLs by nebulization as needed for wheezing. (Patient not taking: Reported on 11/28/2021)   [DISCONTINUED] cyclobenzaprine (FLEXERIL) 5 MG tablet Take 1 tablet (5 mg total) by mouth at bedtime. (Patient not taking: Reported on 11/28/2021)   [DISCONTINUED] fluconazole (DIFLUCAN) 150 MG tablet Take 150 mg tablet, PO, repeat in 4 days following initial use. (Patient  not taking: Reported on 11/28/2021)   [DISCONTINUED] ipratropium-albuterol (DUONEB) 0.5-2.5 (3) MG/3ML SOLN Take 3 mLs by nebulization in the morning, at noon, in the evening, and at bedtime. (Patient not taking: Reported on 11/28/2021)   [DISCONTINUED] sulfamethoxazole-trimethoprim (BACTRIM DS) 800-160 MG tablet Take 1 tablet by mouth 2 (two) times daily. (Patient not taking: Reported on 11/28/2021)   No facility-administered medications prior to visit.    Review of Systems  HENT:  Positive for ear pain, postnasal drip, rhinorrhea and sore throat.   Respiratory:  Positive for cough and wheezing.   Neurological:  Positive for headaches.     Objective    BP 121/89 (BP Location: Left Arm, Patient Position: Sitting, Cuff Size: Large)   Pulse 78   SpO2 100%  BP Readings from Last 3 Encounters:  11/28/21 121/89  08/20/21 123/83  04/20/21 125/86      Physical Exam Constitutional:      General: She is awake. She is not in acute distress.    Appearance: She is well-developed. She is not ill-appearing.  HENT:     Head: Normocephalic.     Mouth/Throat:     Pharynx: Posterior oropharyngeal erythema present. No oropharyngeal exudate.  Eyes:     Conjunctiva/sclera: Conjunctivae normal.  Cardiovascular:     Rate and Rhythm: Normal rate.  Pulmonary:     Effort: Pulmonary effort is normal. No respiratory distress.     Breath sounds: Normal breath sounds. No wheezing, rhonchi or rales.  Skin:    General: Skin is warm.  Neurological:     Mental Status: She is alert and oriented to person, place, and  time.  Psychiatric:        Attention and Perception: Attention normal.        Mood and Affect: Mood normal.        Speech: Speech normal.        Behavior: Behavior normal. Behavior is cooperative.      Results for orders placed or performed in visit on 11/28/21  POCT urine pregnancy  Result Value Ref Range   Preg Test, Ur Positive (A) Negative    Assessment & Plan     URI Advised  rest, fluids, mucinex. Advised pt to stop smoking.  Lungs CTA today if any changes in symptoms, fevers, call office.  2. Amenorrhea 3. Positive pregnancy test. POC positive. Advised pt to not use klonopin rx.  Ref placed to OB  4. Asthma Will monitor frequency of albuterol use, may need ICS/LABA. Advised pt to stop smoking.   Return if symptoms worsen or fail to improve.      I, Michele Kirschner, PA-C have reviewed all documentation for this visit. The documentation on  11/28/2021  for the exam, diagnosis, procedures, and orders are all accurate and complete.  Michele Kirschner, PA-C [facility] [address] [place], [place], [address] Office: [phone] Fax: [place]

## 2021-11-30 ENCOUNTER — Telehealth: Payer: Self-pay

## 2021-11-30 NOTE — Telephone Encounter (Signed)
Please advise 

## 2021-11-30 NOTE — Telephone Encounter (Signed)
Copied from [place] [id number]. Topic: General - Inquiry >> Nov 30, 2021 10:25 AM Penni Bombard wrote: Reason for CRM: Pt called asking if she can get an order for the tubes and a mask for her nebulizer.  [phone]

## 2021-12-03 ENCOUNTER — Other Ambulatory Visit: Payer: Self-pay

## 2021-12-03 ENCOUNTER — Telehealth: Payer: Self-pay

## 2021-12-03 ENCOUNTER — Telehealth: Payer: Self-pay | Admitting: Family Medicine

## 2021-12-03 DIAGNOSIS — O209 Hemorrhage in early pregnancy, unspecified: Secondary | ICD-10-CM

## 2021-12-03 DIAGNOSIS — Z3201 Encounter for pregnancy test, result positive: Secondary | ICD-10-CM

## 2021-12-03 NOTE — Telephone Encounter (Signed)
Patient called and stated that she would like to have another referral ordered for an OBGYN  at a [place] location. She states that she would like to go to [facility] in [place]. Please advise patient if new referral can be ordered.

## 2021-12-03 NOTE — Telephone Encounter (Signed)
Called patient to schedule on behalf her referral for positive PT. Patient states her LMP was 10/23/21. Patient states she took a home PT on 11/13 and received a positive pregnancy test. Went to PCP 11/28/21 for PT and was confirmed positive. Patient states she had intercourse on 11/28/21 and experienced some light spotting. Patient states on 12/01/21 she experienced what resembled a menstrual cycle but is unsure if it was. Patient states she bled during the day but nothing at night time.

## 2021-12-03 NOTE — Telephone Encounter (Signed)
Referral placed.

## 2021-12-03 NOTE — Telephone Encounter (Signed)
Ordered on [place] 

## 2021-12-04 ENCOUNTER — Other Ambulatory Visit: Payer: Medicaid Other

## 2021-12-12 ENCOUNTER — Ambulatory Visit: Admission: RE | Admit: 2021-12-12 | Payer: Medicaid Other | Source: Ambulatory Visit

## 2021-12-19 ENCOUNTER — Ambulatory Visit (INDEPENDENT_AMBULATORY_CARE_PROVIDER_SITE_OTHER): Payer: Medicaid Other

## 2021-12-19 VITALS — Wt 210.0 lb

## 2021-12-19 DIAGNOSIS — Z3689 Encounter for other specified antenatal screening: Secondary | ICD-10-CM

## 2021-12-19 DIAGNOSIS — Z3480 Encounter for supervision of other normal pregnancy, unspecified trimester: Secondary | ICD-10-CM

## 2021-12-19 DIAGNOSIS — Z369 Encounter for antenatal screening, unspecified: Secondary | ICD-10-CM

## 2021-12-19 NOTE — Progress Notes (Addendum)
New OB Intake  I connected with  Nekeya Briski Dowers on 12/19/21 at  3:15 PM EST by telephone and verified that I am speaking with the correct person using two identifiers. Nurse is located at [employer] and pt is located in car.  I explained I am completing New OB Intake today. We discussed her EDD of 05/06/2022 that is based on LMP of 07/30/2021. This makes the pt 20 weeks and she states she does not look like she is 20 weeks. Pt is G6/P1132. I reviewed her allergies, medications, Medical/Surgical/OB history, and appropriate screenings. Based on history, this is a/an pregnancy uncomplicated .   Patient Active Problem List   Diagnosis Date Noted   Mild intermittent asthma without complication 75/10/2583   Citrobacter infection 08/24/2021   BV (bacterial vaginosis) 08/24/2021   Vaginal yeast infection 08/24/2021   Dysuria 08/20/2021   Hair loss 08/20/2021   Stress reaction 08/20/2021   Screening, lipid 08/20/2021   Encounter for annual routine gynecological examination 08/20/2021   Unprotected sex 08/20/2021   Breast pain, right 04/20/2021   Cystitis 10/31/2020   Urinary frequency 10/31/2020   Elevated blood pressure reading in office without diagnosis of hypertension 10/31/2020   Supervision of other normal pregnancy, antepartum 04/05/2015    Concerns addressed today Pt thinks she has miscarried b/c she bleed 11/18-11/25; would bleed after IC; is not in pain nor bleeding today.  Adv will order a dating u/s/visbility scan and if results are good we will order the NOB lab work and NOB exam with a provider; if results are not good will just need an appt with provider.  Pt agreeable to this plan.   Anatomy US Explained first scheduled [place] will be scheduled soon.  Labs Discussed genetic screening with patient. Patient desires genetic testing to be drawn with new OB labs. Discussed possible labs to be drawn at new OB appointment.  COVID Vaccine Patient has not had COVID vaccine.    Social Determinants of Health Food Insecurity: denies food insecurity Transportation: Patient denies transportation needs.  First visit review I reviewed new OB appt with pt. I explained she will have ob bloodwork and pap smear/pelvic exam if indicated. Explained pt will be seen by a provider after we get results of dating/viability scan and hopefully lab results.  Cleophas Dunker, [facility] 12/19/2021  3:46 PM

## 2021-12-20 ENCOUNTER — Telehealth: Payer: Self-pay | Admitting: Certified Nurse Midwife

## 2021-12-20 ENCOUNTER — Other Ambulatory Visit: Payer: Self-pay

## 2021-12-20 DIAGNOSIS — O200 Threatened abortion: Secondary | ICD-10-CM

## 2021-12-20 NOTE — Telephone Encounter (Signed)
I contacted patient via phone to scheduled office visit. Patient states she believes she had a miscarriage 11/18. HCG levels were ordered and patient is scheduled for 12/8 and 12/11 for labs. Patient is also scheduled for NOB appointment pending labs with LMD for 12/14.

## 2021-12-20 NOTE — Telephone Encounter (Signed)
-----   Message from Cleophas Dunker, [place] sent at 12/19/2021  3:59 PM EST ----- Please schedule viability/dating u/s as soon as possible.  Thanks.  I've put the order in for it to be done here; let me know if I need to change it for the hosp.

## 2021-12-21 ENCOUNTER — Ambulatory Visit (INDEPENDENT_AMBULATORY_CARE_PROVIDER_SITE_OTHER): Payer: Medicaid Other | Admitting: Physician Assistant

## 2021-12-21 ENCOUNTER — Ambulatory Visit
Admission: RE | Admit: 2021-12-21 | Discharge: 2021-12-21 | Disposition: A | Discharge status: Home / Self Care | Payer: Medicaid Other | Source: Ambulatory Visit | Attending: Physician Assistant | Admitting: Physician Assistant

## 2021-12-21 ENCOUNTER — Ambulatory Visit
Admission: RE | Admit: 2021-12-21 | Discharge: 2021-12-21 | Disposition: A | Discharge status: Home / Self Care | Payer: Medicaid Other | Attending: Physician Assistant | Admitting: Physician Assistant

## 2021-12-21 ENCOUNTER — Encounter: Payer: Self-pay | Admitting: Physician Assistant

## 2021-12-21 ENCOUNTER — Other Ambulatory Visit: Payer: Medicaid Other

## 2021-12-21 VITALS — BP 121/96 | HR 69 | Ht 65.0 in | Wt 206.0 lb

## 2021-12-21 DIAGNOSIS — M79672 Pain in left foot: Secondary | ICD-10-CM | POA: Insufficient documentation

## 2021-12-21 DIAGNOSIS — R8271 Bacteriuria: Secondary | ICD-10-CM | POA: Insufficient documentation

## 2021-12-21 DIAGNOSIS — M542 Cervicalgia: Secondary | ICD-10-CM | POA: Diagnosis not present

## 2021-12-21 DIAGNOSIS — Z23 Encounter for immunization: Secondary | ICD-10-CM | POA: Diagnosis not present

## 2021-12-21 DIAGNOSIS — R030 Elevated blood-pressure reading, without diagnosis of hypertension: Secondary | ICD-10-CM

## 2021-12-21 DIAGNOSIS — R053 Chronic cough: Secondary | ICD-10-CM

## 2021-12-21 DIAGNOSIS — R079 Chest pain, unspecified: Secondary | ICD-10-CM

## 2021-12-21 DIAGNOSIS — G4700 Insomnia, unspecified: Secondary | ICD-10-CM | POA: Insufficient documentation

## 2021-12-21 HISTORY — DX: Chronic cough: R05.3

## 2021-12-21 HISTORY — DX: Chest pain, unspecified: R07.9

## 2021-12-21 LAB — POCT URINALYSIS DIPSTICK
Bilirubin, UA: NEGATIVE
Blood, UA: NEGATIVE
Glucose, UA: NEGATIVE
Ketones, UA: NEGATIVE
Nitrite, UA: NEGATIVE
Protein, UA: NEGATIVE
Spec Grav, UA: 1.02 (ref 1.010–1.025)
Urobilinogen, UA: 0.2 E.U./dL
pH, UA: 6.5 (ref 5.0–8.0)

## 2021-12-21 MED ORDER — CYCLOBENZAPRINE HCL 5 MG PO TABS: 5.0000 mg | ORAL_TABLET | Freq: Three times a day (TID) | ORAL | 0 refills | Status: DC | PRN

## 2021-12-21 MED ORDER — TRAZODONE HCL 50 MG PO TABS: 25.0000 mg | ORAL_TABLET | Freq: Every evening | ORAL | 1 refills | Status: DC | PRN

## 2021-12-21 NOTE — Assessment & Plan Note (Signed)
Originally discussed muscle relaxants but given pregnancy Advised heat, stretching

## 2021-12-21 NOTE — Progress Notes (Signed)
dg    I,Sha'taria Tyson,acting as a Education administrator for [employer], PA-C.,have documented all relevant documentation on the behalf of [name], PA-C,as directed by  [name], PA-C while in the presence of [name], PA-C.   Established patient visit   Patient: Michele Bailey   DOB: 06-10-1995   25 y.o. Female  MRN: [id number] Visit Date: 12/21/2021  Today's healthcare provider: [name], PA-C   Cc. Several concerns  Subjective    HPI   Netanya is a 26 y/o female who is 39 w pregnant using LMP, with possible miscarriage , she reports a few days after her positive pregnancy test she started bleeding, started as spotting after intercourse and then had a 'full period' since then is having some vaginal bleeding after bowel movements. She has an appt with OB today.  Presents today with several concerns :   Central chest pain for a few months, comes and goes, increases with activity, coughing, Slightly reproducible when she touches. Associated with a productive cough w/ dark brown/black mucous that did not change after her recent viral illness. Reports smoking cessation recently. Denies history of cardiac problems, reports pre-eclampsia postpartum after one of her children.   She reports left foot pain, pain at one specific part of the bottom of her foot. Denies injury, skin changes, bruising, swelling.   She also has issues with neck pain, feels tension at the back of her neck after work. Questions about muscle relaxants.   She also reports insomnia-- she works third shift and usually only sleeps 2-3 hours during the day. Reports melatonin does not work.   Medications: Outpatient Medications Prior to Visit  Medication Sig   albuterol (VENTOLIN HFA) 108 (90 Base) MCG/ACT inhaler Inhale 2 puffs into the lungs every 6 (six) hours as needed for wheezing or shortness of breath.   clonazePAM (KLONOPIN) 0.5 MG tablet Take 1 tablet (0.5 mg total) by mouth daily as needed for  anxiety. (Patient not taking: Reported on 12/19/2021)   Prenatal Vit-Fe Fumarate-FA (PRENATAL MULTIVITAMIN) TABS tablet Take 1 tablet by mouth daily at 12 noon.   sodium chloride 0.9 % nebulizer solution Take 3 mLs by nebulization as needed for wheezing.   No facility-administered medications prior to visit.    Review of Systems  Constitutional:  Negative for fatigue and fever.  Respiratory:  Positive for cough. Negative for shortness of breath and wheezing.   Cardiovascular:  Positive for chest pain. Negative for leg swelling.  Gastrointestinal:  Negative for abdominal pain.  Genitourinary:  Positive for vaginal bleeding.  Musculoskeletal:  Positive for arthralgias, myalgias, neck pain and neck stiffness.  Neurological:  Negative for dizziness and headaches.  Psychiatric/Behavioral:  Positive for sleep disturbance.      Objective    BP (!) 121/96 (BP Location: Right Arm, Patient Position: Sitting)   Pulse 69   Ht '5\' 5"'$  (1.651 m)   Wt 206 lb (93.4 kg)   LMP 07/30/2021 (Exact Date)   SpO2 100%   BMI 34.28 kg/m  {Blood pressure (!) 121/96, pulse 69, height '5\' 5"'$  (1.651 m), weight 206 lb (93.4 kg), last menstrual period 07/30/2021, SpO2 100 %.    Physical Exam Constitutional:      General: She is awake.     Appearance: She is well-developed.  HENT:     Head: Normocephalic.  Eyes:     Conjunctiva/sclera: Conjunctivae normal.  Cardiovascular:     Rate and Rhythm: Normal rate and regular rhythm.     Heart  sounds: Normal heart sounds.  Pulmonary:     Effort: Pulmonary effort is normal.     Breath sounds: Normal breath sounds.  Chest:     Chest wall: Tenderness present.  Musculoskeletal:     Comments: Tenderness to palpation of plantar surface of 1st metatarsal  No visible skin changes, ecchymosis, edema.   Skin:    General: Skin is warm.  Neurological:     Mental Status: She is alert and oriented to person, place, and time.  Psychiatric:        Attention and Perception:  Attention normal.        Mood and Affect: Mood normal.        Speech: Speech normal.        Behavior: Behavior is cooperative.     Results for orders placed or performed in visit on 12/21/21  POCT Urinalysis Dipstick  Result Value Ref Range   Color, UA     Clarity, UA     Glucose, UA Negative Negative   Bilirubin, UA negative    Ketones, UA negative    Spec Grav, UA 1.020 1.010 - 1.025   Blood, UA negative    pH, UA 6.5 5.0 - 8.0   Protein, UA Negative Negative   Urobilinogen, UA 0.2 0.2 or 1.0 E.U./dL   Nitrite, UA negative    Leukocytes, UA Moderate (2+) (A) Negative   Appearance     Odor      Assessment & Plan     Problem List Items Addressed This Visit       Other   Elevated blood pressure reading in office without diagnosis of hypertension    Improved on repeat but given pregnancy status UA - protein  Advised strongly she discuss with ob today      Relevant Orders   POCT Urinalysis Dipstick (Completed)   Chronic cough    Continue d/c smoking  D/t sputum abn recommend cxr and pulm ref      Relevant Orders   Ambulatory referral to Pulmonology   DG Chest 2 View   Chest pain    EKG sinus brady Pain may be 2/2 cough, respiratory in nature Advised pt to monitor Ref to pulm       Relevant Orders   Ambulatory referral to Pulmonology   EKG 12-Lead   Neck pain    Originally discussed muscle relaxants but given pregnancy Advised heat, stretching      Insomnia    D/t third shift. Recommend trazodone 25-50 mg qhs Safe in pregnancy       Relevant Medications   traZODone (DESYREL) 50 MG tablet   Left foot pain - Primary    Tenderness no other exam abn  Ordered foot xray      Relevant Orders   DG Foot Complete Left   Bacteria in urine    Small leuk on UA Ordered culture Advise she discuss with ob      Relevant Orders   Urine Culture   Other Visit Diagnoses     Need for immunization against influenza       Relevant Orders   Flu Vaccine  QUAD 59moIM (Fluarix, Fluzone & Alfiuria Quad PF) (Completed)       Return if symptoms worsen or fail to improve.      I, LMikey Kirschner PA-C have reviewed all documentation for this visit. The documentation on  12/21/2021  for the exam, diagnosis, procedures, and orders are all accurate and complete.   [facility]

## 2021-12-21 NOTE — Assessment & Plan Note (Signed)
EKG sinus brady Pain may be 2/2 cough, respiratory in nature Advised pt to monitor Ref to pulm

## 2021-12-21 NOTE — Assessment & Plan Note (Signed)
D/t third shift. Recommend trazodone 25-50 mg qhs Safe in pregnancy

## 2021-12-21 NOTE — Assessment & Plan Note (Signed)
Tenderness no other exam abn  Ordered foot xray

## 2021-12-21 NOTE — Assessment & Plan Note (Signed)
Improved on repeat but given pregnancy status UA - protein  Advised strongly she discuss with ob today

## 2021-12-21 NOTE — Assessment & Plan Note (Signed)
Continue d/c smoking  D/t sputum abn recommend cxr and pulm ref

## 2021-12-21 NOTE — Assessment & Plan Note (Signed)
Small leuk on UA Ordered culture Advise she discuss with ob

## 2021-12-22 LAB — BETA HCG QUANT (REF LAB): hCG Quant: <1 m[IU]/mL

## 2021-12-24 ENCOUNTER — Institutional Professional Consult (permissible substitution): Payer: Medicaid Other | Admitting: Student in an Organized Health Care Education/Training Program

## 2021-12-24 ENCOUNTER — Other Ambulatory Visit: Payer: Medicaid Other

## 2021-12-24 ENCOUNTER — Telehealth: Payer: Self-pay | Admitting: Licensed Practical Nurse

## 2021-12-24 NOTE — Telephone Encounter (Signed)
Reached out to pt to reschedule Beta labs that were scheduled on 12/24/21 at 10:20.  Pt stated that she had just had labs on 12/21/21 and she did not want to reschedule.  She said "it was nothing".  I asked if she wanted to keep the [facility] appt scheduled for 12/14 and she said "yes".

## 2021-12-27 ENCOUNTER — Encounter: Payer: Medicaid Other | Admitting: Licensed Practical Nurse

## 2021-12-27 ENCOUNTER — Telehealth: Payer: Self-pay | Admitting: Licensed Practical Nurse

## 2021-12-27 ENCOUNTER — Other Ambulatory Visit: Payer: Self-pay | Admitting: Physician Assistant

## 2021-12-27 DIAGNOSIS — N3000 Acute cystitis without hematuria: Secondary | ICD-10-CM

## 2021-12-27 LAB — URINE CULTURE

## 2021-12-27 MED ORDER — NITROFURANTOIN MONOHYD MACRO 100 MG PO CAPS: 100.0000 mg | ORAL_CAPSULE | Freq: Two times a day (BID) | ORAL | 0 refills | Status: AC

## 2021-12-27 NOTE — Telephone Encounter (Signed)
Reached out to pt to reschedule NOB appt that was scheduled for 12/14 at 2:55 with LMD.  Left message for pt to call back.

## 2021-12-28 ENCOUNTER — Encounter: Payer: Self-pay | Admitting: Licensed Practical Nurse

## 2021-12-28 NOTE — Telephone Encounter (Signed)
Reached out to pt (2x) to reschedule NOB appt that was scheduled for 12/14 at 2:55 with LMD.  Left message for pt to call back.  Will send a MyChart letter.

## 2022-01-25 ENCOUNTER — Telehealth: Payer: Self-pay

## 2022-01-25 NOTE — Telephone Encounter (Signed)
Copied from [place] [phone]. Topic: Referral - Question >> Jan 24, 2022  3:28 PM Penni Bombard wrote: Reason for CRM: Janett Billow with [facility] obgyn called asking about a referral that was done back in November and needs to know the status.  CB#  [phone] Janett Billow

## 2022-01-30 NOTE — Telephone Encounter (Signed)
[facility] referred to [facility]. Left message for Janett Billow advising.

## 2022-02-01 ENCOUNTER — Encounter: Payer: Self-pay | Admitting: Student in an Organized Health Care Education/Training Program

## 2022-02-01 ENCOUNTER — Ambulatory Visit (INDEPENDENT_AMBULATORY_CARE_PROVIDER_SITE_OTHER): Payer: Medicaid Other | Admitting: Student in an Organized Health Care Education/Training Program

## 2022-02-01 VITALS — BP 122/70 | HR 66 | Temp 97.8°F | Ht 65.0 in | Wt 209.4 lb

## 2022-02-01 DIAGNOSIS — R053 Chronic cough: Secondary | ICD-10-CM | POA: Diagnosis not present

## 2022-02-01 DIAGNOSIS — J4520 Mild intermittent asthma, uncomplicated: Secondary | ICD-10-CM

## 2022-02-01 MED ORDER — DESLORATADINE 5 MG PO TABS: 5.0000 mg | ORAL_TABLET | Freq: Every day | ORAL | 11 refills | Status: DC

## 2022-02-01 MED ORDER — ALBUTEROL SULFATE HFA 108 (90 BASE) MCG/ACT IN AERS: 2.0000 | INHALATION_SPRAY | Freq: Four times a day (QID) | RESPIRATORY_TRACT | 1 refills | Status: DC | PRN

## 2022-02-01 NOTE — Progress Notes (Signed)
Synopsis: Referred in for chronic cough by [name], PA-C  Assessment & Plan:   1. Chronic cough  She is presenting for the evaluation of a chronic cough with the production of thick dark colored sputum. My differential includes asthma, bronchiectasis, and upper airway cough syndrome. Her physical exam is within normal with no wheezing or rales, which is re-assuring. Will initiate a workup with PFT's and CT chest (to rule out bronchiectasis). Will initiate an anti-histamine to help manage her symptoms of rhinorrhea.  - Pulmonary Function Test [facility] Only; Future - CT CHEST WO CONTRAST; Future - desloratadine (CLARINEX) 5 MG tablet; Take 1 tablet (5 mg total) by mouth daily.  Dispense: 30 tablet; Refill: 11   Return in about 6 weeks (around 03/15/2022).  I spent 60 minutes caring for this patient today, including preparing to see the patient, obtaining a medical history , reviewing a separately obtained history, performing a medically appropriate examination and/or evaluation, counseling and educating the patient/family/caregiver, ordering medications, tests, or procedures, documenting clinical information in the electronic health record, and independently interpreting results (not separately reported/billed) and communicating results to the patient/family/caregiver  Armando Reichert, MD [facility] 02/01/2022 9:02 AM    End of visit medications:  Meds ordered this encounter  Medications   desloratadine (CLARINEX) 5 MG tablet    Sig: Take 1 tablet (5 mg total) by mouth daily.    Dispense:  30 tablet    Refill:  11     Current Outpatient Medications:    albuterol (VENTOLIN HFA) 108 (90 Base) MCG/ACT inhaler, Inhale 2 puffs into the lungs every 6 (six) hours as needed for wheezing or shortness of breath., Disp: 18 g, Rfl: 1   desloratadine (CLARINEX) 5 MG tablet, Take 1 tablet (5 mg total) by mouth daily., Disp: 30 tablet, Rfl: 11   Prenatal Vit-Fe Fumarate-FA  (PRENATAL MULTIVITAMIN) TABS tablet, Take 1 tablet by mouth daily at 12 noon., Disp: , Rfl:    sodium chloride 0.9 % nebulizer solution, Take 3 mLs by nebulization as needed for wheezing., Disp: 90 mL, Rfl: 12   traZODone (DESYREL) 50 MG tablet, Take 0.5-1 tablets (25-50 mg total) by mouth at bedtime as needed for sleep., Disp: 30 tablet, Rfl: 1   clonazePAM (KLONOPIN) 0.5 MG tablet, Take 1 tablet (0.5 mg total) by mouth daily as needed for anxiety. (Patient not taking: Reported on 02/01/2022), Disp: 30 tablet, Rfl: 0   Subjective:   PATIENT ID: Michele Bailey GENDER: female DOB: 1995/09/11, MRN: [id number]  Chief Complaint  Patient presents with   pulmonary consult    C/o prod cough with brown to black sputum and occ chest discomfort.      HPI  Michele Bailey is a pleasant 27 year old previously healthy female presenting to clinic for the evaluation of cough.  She started experiencing symptoms around 2 years ago and has had a cough productive of brownish sputum since then.  The cough is mostly noted in the morning but does happen throughout the day, and she brings up small chunks of brown/dark-colored sputum.  There is no fever, no chills, no chest pain, no chest tightness, no wheezing, no night sweats, and no weight loss. The patient has no past medical history of asthma or bronchitis in the past and has no other respiratory problems. She does report using an albuterol with some improvement. She reports exertional dyspnea but attributes it to being out of shape.  She feels that going up a few flights of  stairs keeps her winded.  She was recently pregnant but then had a miscarriage a few days after she discovered she was pregnant. She reports having rhinorrhea chronically, which is now worse because she's getting over a cold.  She is a former smoker, quit in November 2023.  She used to smoke cigarettes for a few years.  She has a history of smoking marijuana but has quit, having smoked joints  occasionally.  She does not vape and has no other inhalational exposures.  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  Ancillary information including prior medications, full medical/surgical/family/social histories, and PFTs (when available) are listed below and have been reviewed.   Review of Systems  Constitutional:  Negative for chills, fever and weight loss.  HENT:  Positive for congestion.   Respiratory:  Positive for cough, sputum production and shortness of breath. Negative for hemoptysis and wheezing.   Cardiovascular:  Negative for chest pain.     Objective:   Vitals:   02/01/22 0832  BP: 122/70  Pulse: 66  Temp: 97.8 F (36.6 C)  TempSrc: Temporal  SpO2: 99%  Weight: 209 lb 6.4 oz (95 kg)  Height: '5\' 5"'$  (1.651 m)   99% on RA  BMI Readings from Last 3 Encounters:  02/01/22 34.85 kg/m  12/21/21 34.28 kg/m  12/19/21 36.05 kg/m   Wt Readings from Last 3 Encounters:  02/01/22 209 lb 6.4 oz (95 kg)  12/21/21 206 lb (93.4 kg)  12/19/21 210 lb (95.3 kg)    Physical Exam Constitutional:      General: She is not in acute distress.    Appearance: She is obese. She is not ill-appearing.  HENT:     Head: Normocephalic.     Mouth/Throat:     Mouth: Mucous membranes are moist.  Eyes:     Extraocular Movements: Extraocular movements intact.  Cardiovascular:     Rate and Rhythm: Normal rate and regular rhythm.     Pulses: Normal pulses.     Heart sounds: Normal heart sounds.  Pulmonary:     Effort: Pulmonary effort is normal. No respiratory distress.     Breath sounds: Normal breath sounds. No wheezing or rales.  Musculoskeletal:        General: Normal range of motion.  Neurological:     General: No focal deficit present.     Mental Status: She is alert and oriented to person, place, and time. Mental status is at baseline.     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     Family History  Problem Relation Age of Onset   Rheum arthritis Mother    Migraines Mother    Thyroid disease Mother        hypothyroid   Cleft palate Mother    Hyperlipidemia Father    Hypertension Father    Esophageal cancer Father 71   Rheum arthritis Maternal Grandfather    Cancer Maternal Grandfather 20       liver   Cancer Paternal Grandfather 44       prostate   Ovarian cancer Other        mother's great aunt   Cancer - Other Other        fob-father died at age 73y from a type of ca that he was born with  Past Surgical History:  Procedure Laterality Date   INDUCED ABORTION     WISDOM TOOTH EXTRACTION     top two; age 24    Social History   Socioeconomic History   Marital status: Significant Other    Spouse name: laquaviua   Number of children: 2   Years of education: 12   Highest education level: Not on file  Occupational History   Occupation: unemployed   Occupation: [employer]  Tobacco Use   Smoking status: Former    Packs/day: 0.25    Types: Cigarettes    Quit date: 11/2021    Years since quitting: 0.2   Smokeless tobacco: Never   Tobacco comments:    quit smoking 11/2020  Vaping Use   Vaping Use: Never used  Substance and Sexual Activity   Alcohol use: No   Drug use: No   Sexual activity: Yes    Partners: Male    Birth control/protection: None    Comment: planning depo  Other Topics Concern   Not on file  Social History Narrative   Not on file   Social Determinants of Health   Financial Resource Strain: Low Risk  (12/19/2021)   Overall Financial Resource Strain (CARDIA)    Difficulty of Paying Living Expenses: Not hard at all  Food Insecurity: No Food Insecurity (12/19/2021)   Hunger Vital Sign    Worried About Running Out of Food in the Last Year: Never true    [place] in the Last Year: Never true  Transportation Needs: No Transportation Needs  (12/19/2021)   PRAPARE - Hydrologist (Medical): No    Lack of Transportation (Non-Medical): No  Physical Activity: Insufficiently Active (12/19/2021)   Exercise Vital Sign    Days of Exercise per Week: 1 day    Minutes of Exercise per Session: 30 min  Stress: Stress Concern Present (12/19/2021)   [place]    Feeling of Stress : To some extent  Social Connections: Moderately Integrated (12/19/2021)   Social Connection and Isolation Panel [NHANES]    Frequency of Communication with Friends and Family: Twice a week    Frequency of Social Gatherings with Friends and Family: Once a week    Attends Religious Services: 1 to 4 times per year    Active Member of [employer] or Organizations: No    Attends Archivist Meetings: Never    Marital Status: Living with partner  Intimate Partner Violence: Not At Risk (12/19/2021)   Humiliation, Afraid, Rape, and Kick questionnaire    Fear of Current or Ex-Partner: No    Emotionally Abused: No    Physically Abused: No    Sexually Abused: No     Allergies  Allergen Reactions   Augmentin [Amoxicillin-Pot Clavulanate] Nausea Only    Willing to take med if necessary with nausea med     CBC    Component Value Date/Time   WBC 9.9 08/20/2021 1351   WBC 16.0 (H) 02/09/2018 0412   RBC 4.37 08/20/2021 1351   RBC 3.42 (L) 02/09/2018 0412   HGB 13.8 08/20/2021 1351   HCT 40.3 08/20/2021 1351   PLT 375 08/20/2021 1351   MCV 92 08/20/2021 1351   MCV 91 01/30/2013 1456   MCH 31.6 08/20/2021 1351   MCH 32.2 02/09/2018 0412   MCHC 34.2 08/20/2021 1351   MCHC 33.2 02/09/2018 0412   RDW 11.6 (L) 08/20/2021 1351  RDW 13.2 01/30/2013 1456   LYMPHSABS 2.9 08/20/2021 1351   LYMPHSABS 1.5 01/30/2013 1456   MONOABS 0.6 02/06/2018 0158   MONOABS 0.8 01/30/2013 1456   EOSABS 0.3 08/20/2021 1351   EOSABS 0.1 01/30/2013 1456   BASOSABS 0.1 08/20/2021 1351    BASOSABS 0.1 01/30/2013 1456    Pulmonary Functions Testing Results:     No data to display          Outpatient Medications Prior to Visit  Medication Sig Dispense Refill   albuterol (VENTOLIN HFA) 108 (90 Base) MCG/ACT inhaler Inhale 2 puffs into the lungs every 6 (six) hours as needed for wheezing or shortness of breath. 18 g 1   Prenatal Vit-Fe Fumarate-FA (PRENATAL MULTIVITAMIN) TABS tablet Take 1 tablet by mouth daily at 12 noon.     sodium chloride 0.9 % nebulizer solution Take 3 mLs by nebulization as needed for wheezing. 90 mL 12   traZODone (DESYREL) 50 MG tablet Take 0.5-1 tablets (25-50 mg total) by mouth at bedtime as needed for sleep. 30 tablet 1   clonazePAM (KLONOPIN) 0.5 MG tablet Take 1 tablet (0.5 mg total) by mouth daily as needed for anxiety. (Patient not taking: Reported on 02/01/2022) 30 tablet 0   No facility-administered medications prior to visit.

## 2022-02-11 ENCOUNTER — Ambulatory Visit
Admission: RE | Admit: 2022-02-11 | Discharge: 2022-02-11 | Disposition: A | Discharge status: Home / Self Care | Payer: Medicaid Other | Source: Ambulatory Visit | Attending: Family Medicine | Admitting: Family Medicine

## 2022-02-11 ENCOUNTER — Encounter: Payer: Self-pay | Admitting: Radiology

## 2022-02-11 DIAGNOSIS — R053 Chronic cough: Secondary | ICD-10-CM | POA: Diagnosis not present

## 2022-02-12 NOTE — Addendum Note (Signed)
Addended byArmando Reichert on: 02/12/2022 03:15 PM   Modules accepted: Orders

## 2022-02-13 ENCOUNTER — Telehealth: Payer: Self-pay | Admitting: Student in an Organized Health Care Education/Training Program

## 2022-02-14 NOTE — Telephone Encounter (Signed)
Armando Reichert, MD 02/12/2022  3:15 PM EST     Please call the patient with results: The chest CT is re-assuring and doesn't show any lung disease. It does show a small hiatal hernia that I will follow up on with a swallowing study to see if reflux disease is contributing to the cough.    Patient is aware of results and voiced her understanding.  Nothing further needed.

## 2022-02-21 ENCOUNTER — Ambulatory Visit
Admission: RE | Admit: 2022-02-21 | Discharge: 2022-02-21 | Disposition: A | Discharge status: Home / Self Care | Payer: Medicaid Other | Source: Ambulatory Visit | Attending: Student in an Organized Health Care Education/Training Program | Admitting: Student in an Organized Health Care Education/Training Program

## 2022-02-21 DIAGNOSIS — R053 Chronic cough: Secondary | ICD-10-CM | POA: Insufficient documentation

## 2022-03-14 ENCOUNTER — Ambulatory Visit: Payer: Medicaid Other | Admitting: Licensed Practical Nurse

## 2022-03-15 ENCOUNTER — Ambulatory Visit: Payer: Medicaid Other | Admitting: Student in an Organized Health Care Education/Training Program

## 2022-06-13 ENCOUNTER — Encounter: Payer: Self-pay | Admitting: Physician Assistant

## 2022-06-13 ENCOUNTER — Ambulatory Visit (INDEPENDENT_AMBULATORY_CARE_PROVIDER_SITE_OTHER): Payer: Medicaid Other | Admitting: Physician Assistant

## 2022-06-13 VITALS — BP 113/69 | HR 73 | Temp 98.4°F | Resp 14 | Ht 65.0 in | Wt 201.8 lb

## 2022-06-13 DIAGNOSIS — K449 Diaphragmatic hernia without obstruction or gangrene: Secondary | ICD-10-CM

## 2022-06-13 DIAGNOSIS — N898 Other specified noninflammatory disorders of vagina: Secondary | ICD-10-CM | POA: Diagnosis not present

## 2022-06-13 DIAGNOSIS — R39198 Other difficulties with micturition: Secondary | ICD-10-CM

## 2022-06-13 LAB — POCT URINALYSIS DIPSTICK
Bilirubin, UA: NEGATIVE
Blood, UA: NEGATIVE
Glucose, UA: NEGATIVE
Leukocytes, UA: NEGATIVE
Nitrite, UA: NEGATIVE
Protein, UA: NEGATIVE
Spec Grav, UA: 1.01 (ref 1.010–1.025)
Urobilinogen, UA: 0.2 E.U./dL — AB
pH, UA: 8 (ref 5.0–8.0)

## 2022-06-13 NOTE — Progress Notes (Signed)
I,Vanessa  Vital,acting as a Neurosurgeon for [employer], PA-C.,have documented all relevant documentation on the behalf of Alfredia Ferguson, PA-C,as directed by  Alfredia Ferguson, PA-C while in the presence of Alfredia Ferguson, PA-C.    Established patient visit   Patient: Michele Bailey   DOB: July 30, 1995   27 y.o. Female  MRN: [id number] Visit Date: 06/13/2022  Today's healthcare provider: Alfredia Ferguson, PA-C   Cc. Urgency, smell  Subjective    HPI  Pt reports increase in urgency and decrease in the amount of urine despite urgency. Reports darker urine color. She also reports possible BV- there is an odor per pt.  Denies significant change in discharge or itching.   She also has concerns over a hiatal hernia and if there are any exercise precautions.  Patient's last menstrual period was 05/25/2022 (exact date).   Medications: Outpatient Medications Prior to Visit  Medication Sig   albuterol (VENTOLIN HFA) 108 (90 Base) MCG/ACT inhaler Inhale 2 puffs into the lungs every 6 (six) hours as needed for wheezing or shortness of breath.   Prenatal Vit-Fe Fumarate-FA (PRENATAL MULTIVITAMIN) TABS tablet Take 1 tablet by mouth daily at 12 noon.   sodium chloride 0.9 % nebulizer solution Take 3 mLs by nebulization as needed for wheezing.   traZODone (DESYREL) 50 MG tablet Take 0.5-1 tablets (25-50 mg total) by mouth at bedtime as needed for sleep.   clonazePAM (KLONOPIN) 0.5 MG tablet Take 1 tablet (0.5 mg total) by mouth daily as needed for anxiety. (Patient not taking: Reported on 06/13/2022)   desloratadine (CLARINEX) 5 MG tablet Take 1 tablet (5 mg total) by mouth daily. (Patient not taking: Reported on 06/13/2022)   No facility-administered medications prior to visit.    Review of Systems  Constitutional:  Negative for fatigue and fever.  Respiratory:  Negative for cough and shortness of breath.   Cardiovascular:  Negative for chest pain and leg swelling.  Gastrointestinal:  Negative  for abdominal pain.  Genitourinary:  Positive for difficulty urinating and urgency.  Neurological:  Negative for dizziness and headaches.      Objective    Blood pressure 113/69, pulse 73, temperature 98.4 F (36.9 C), temperature source Oral, resp. rate 14, height 5\' 5"  (1.651 m), weight 201 lb 12.8 oz (91.5 kg), last menstrual period 05/25/2022, SpO2 100 %, unknown if currently breastfeeding.   Physical Exam Vitals reviewed.  Constitutional:      Appearance: She is not ill-appearing.  HENT:     Head: Normocephalic.  Eyes:     Conjunctiva/sclera: Conjunctivae normal.  Cardiovascular:     Rate and Rhythm: Normal rate.  Pulmonary:     Effort: Pulmonary effort is normal. No respiratory distress.  Neurological:     General: No focal deficit present.     Mental Status: She is alert and oriented to person, place, and time.  Psychiatric:        Mood and Affect: Mood normal.        Behavior: Behavior normal.      Results for orders placed or performed in visit on 06/13/22  POCT Urinalysis Dipstick  Result Value Ref Range   Color, UA yellow    Clarity, UA clear    Glucose, UA Negative Negative   Bilirubin, UA Negative    Ketones, UA Trace    Spec Grav, UA 1.010 1.010 - 1.025   Blood, UA Negative    pH, UA 8.0 5.0 - 8.0   Protein, UA Negative Negative  Urobilinogen, UA 0.2 (A) 0.2 or 1.0 E.U./dL   Nitrite, UA Negative    Leukocytes, UA Negative Negative   Appearance clear    Odor      Assessment & Plan     1. Difficulty urinating UA negative. Sending for culture d/t symptoms. - POCT Urinalysis Dipstick - Urine Culture  2. Vaginal discharge Swab r/o bv/yeast/STI  - NuSwab Vaginitis Plus (VG+)   3. Hiatal hernia -reviewed imaging. Mild. Reassured.  Return if symptoms worsen or fail to improve.      I, Alfredia Ferguson, PA-C have reviewed all documentation for this visit. The documentation on  06/14/22   for the exam, diagnosis, procedures, and orders are all  accurate and complete.  Alfredia Ferguson, PA-C [facility] [address] [place], [place], [address] Office: [phone] Fax: [phone]   [facility]

## 2022-06-14 ENCOUNTER — Encounter: Payer: Self-pay | Admitting: Physician Assistant

## 2022-06-15 LAB — URINE CULTURE

## 2022-06-15 LAB — SPECIMEN STATUS REPORT

## 2022-06-18 ENCOUNTER — Other Ambulatory Visit: Payer: Self-pay | Admitting: Physician Assistant

## 2022-06-18 DIAGNOSIS — B9689 Other specified bacterial agents as the cause of diseases classified elsewhere: Secondary | ICD-10-CM

## 2022-06-18 LAB — NUSWAB VAGINITIS PLUS (VG+)
Atopobium vaginae: HIGH Score — AB
Candida albicans, NAA: NEGATIVE
Candida glabrata, NAA: NEGATIVE
Chlamydia trachomatis, NAA: NEGATIVE
Neisseria gonorrhoeae, NAA: NEGATIVE
Trich vag by NAA: NEGATIVE

## 2022-06-18 LAB — SPECIMEN STATUS REPORT

## 2022-06-18 MED ORDER — METRONIDAZOLE 500 MG PO TABS: 500.0000 mg | ORAL_TABLET | Freq: Two times a day (BID) | ORAL | 0 refills | Status: AC

## 2022-07-01 ENCOUNTER — Ambulatory Visit: Payer: Medicaid Other | Admitting: Certified Nurse Midwife

## 2022-07-01 ENCOUNTER — Encounter: Payer: Self-pay | Admitting: Certified Nurse Midwife

## 2022-07-01 ENCOUNTER — Other Ambulatory Visit (HOSPITAL_COMMUNITY)
Admission: RE | Admit: 2022-07-01 | Discharge: 2022-07-01 | Disposition: A | Discharge status: Home / Self Care | Payer: Medicaid Other | Source: Ambulatory Visit | Attending: Certified Nurse Midwife | Admitting: Certified Nurse Midwife

## 2022-07-01 VITALS — BP 120/74 | HR 79 | Ht 65.0 in | Wt 205.0 lb

## 2022-07-01 DIAGNOSIS — Z319 Encounter for procreative management, unspecified: Secondary | ICD-10-CM

## 2022-07-01 DIAGNOSIS — N941 Unspecified dyspareunia: Secondary | ICD-10-CM

## 2022-07-01 MED ORDER — FUSION 65-65-25-30 MG PO CAPS: 1.0000 | ORAL_CAPSULE | Freq: Every day | ORAL | 3 refills | Status: DC

## 2022-07-01 NOTE — Progress Notes (Signed)
Jacky Kindle, FNP   Chief Complaint  Patient presents with   Pelvic Pain    Right side pain after intercourse    HPI:      Michele Bailey is a 27 y.o. [id number] whose LMP was Patient's last menstrual period was 06/22/2022 (exact date)., presents today for right sided pain with intercourse & desire to conceive.    Patient Active Problem List   Diagnosis Date Noted   Chronic cough 12/21/2021   Chest pain 12/21/2021   Neck pain 12/21/2021   Insomnia 12/21/2021   Left foot pain 12/21/2021   Bacteria in urine 12/21/2021   Mild intermittent asthma without complication 11/28/2021   Citrobacter infection 08/24/2021   BV (bacterial vaginosis) 08/24/2021   Vaginal yeast infection 08/24/2021   Dysuria 08/20/2021   Hair loss 08/20/2021   Stress reaction 08/20/2021   Screening, lipid 08/20/2021   Encounter for annual routine gynecological examination 08/20/2021   Unprotected sex 08/20/2021   Breast pain, right 04/20/2021   Cystitis 10/31/2020   Urinary frequency 10/31/2020   Elevated blood pressure reading in office without diagnosis of hypertension 10/31/2020    Past Surgical History:  Procedure Laterality Date   INDUCED ABORTION     WISDOM TOOTH EXTRACTION     top two; age 86    Family History  Problem Relation Age of Onset   Rheum arthritis Mother    Migraines Mother    Thyroid disease Mother        hypothyroid   Cleft palate Mother    Hyperlipidemia Father    Hypertension Father    Esophageal cancer Father 28   Rheum arthritis Maternal Grandfather    Cancer Maternal Grandfather 77       liver   Cancer Paternal Grandfather 19       prostate   Ovarian cancer Other        mother's great aunt   Cancer - Other Other        fob-father died at age 56y from a type of ca that he was born with    Social History   Socioeconomic History   Marital status: Significant Other    Spouse name: laquaviua   Number of children: 2   Years of education: 12   Highest  education level: Not on file  Occupational History   Occupation: unemployed   Occupation: [employer]  Tobacco Use   Smoking status: Former    Packs/day: .25    Types: Cigarettes    Quit date: 11/2021    Years since quitting: 0.6   Smokeless tobacco: Never   Tobacco comments:    quit smoking 11/2020  Vaping Use   Vaping Use: Never used  Substance and Sexual Activity   Alcohol use: No   Drug use: No   Sexual activity: Yes    Partners: Male    Birth control/protection: None    Comment: planning depo  Other Topics Concern   Not on file  Social History Narrative   Not on file   Social Determinants of Health   Financial Resource Strain: Low Risk  (12/19/2021)   Overall Financial Resource Strain (CARDIA)    Difficulty of Paying Living Expenses: Not hard at all  Food Insecurity: No Food Insecurity (12/19/2021)   Hunger Vital Sign    Worried About Running Out of Food in the Last Year: Never true    Ran Out of Food in the Last Year: Never true  Transportation Needs:  No Transportation Needs (12/19/2021)   PRAPARE - Administrator, Civil Service (Medical): No    Lack of Transportation (Non-Medical): No  Physical Activity: Insufficiently Active (12/19/2021)   Exercise Vital Sign    Days of Exercise per Week: 1 day    Minutes of Exercise per Session: 30 min  Stress: Stress Concern Present (12/19/2021)   [employer] of Occupational Health - Occupational Stress Questionnaire    Feeling of Stress : To some extent  Social Connections: Moderately Integrated (12/19/2021)   Social Connection and Isolation Panel [NHANES]    Frequency of Communication with Friends and Family: Twice a week    Frequency of Social Gatherings with Friends and Family: Once a week    Attends Religious Services: 1 to 4 times per year    Active Member of [employer] or Organizations: No    Attends Banker Meetings: Never    Marital Status: Living with partner  Intimate Partner  Violence: Not At Risk (12/19/2021)   Humiliation, Afraid, Rape, and Kick questionnaire    Fear of Current or Ex-Partner: No    Emotionally Abused: No    Physically Abused: No    Sexually Abused: No    Outpatient Medications Prior to Visit  Medication Sig Dispense Refill   albuterol (VENTOLIN HFA) 108 (90 Base) MCG/ACT inhaler Inhale 2 puffs into the lungs every 6 (six) hours as needed for wheezing or shortness of breath. (Patient not taking: Reported on 07/01/2022) 18 g 1   clonazePAM (KLONOPIN) 0.5 MG tablet Take 1 tablet (0.5 mg total) by mouth daily as needed for anxiety. (Patient not taking: Reported on 06/13/2022) 30 tablet 0   desloratadine (CLARINEX) 5 MG tablet Take 1 tablet (5 mg total) by mouth daily. (Patient not taking: Reported on 06/13/2022) 30 tablet 11   Prenatal Vit-Fe Fumarate-FA (PRENATAL MULTIVITAMIN) TABS tablet Take 1 tablet by mouth daily at 12 noon. (Patient not taking: Reported on 07/01/2022)     sodium chloride 0.9 % nebulizer solution Take 3 mLs by nebulization as needed for wheezing. (Patient not taking: Reported on 07/01/2022) 90 mL 12   traZODone (DESYREL) 50 MG tablet Take 0.5-1 tablets (25-50 mg total) by mouth at bedtime as needed for sleep. (Patient not taking: Reported on 07/01/2022) 30 tablet 1   No facility-administered medications prior to visit.      ROS:  Review of Systems  Constitutional: Negative.   Respiratory: Negative.    Cardiovascular: Negative.   Genitourinary:  Positive for dyspareunia.     OBJECTIVE:   Vitals:  BP 120/74   Pulse 79   Ht 5\' 5"  (1.651 m)   Wt 205 lb (93 kg)   LMP 06/22/2022 (Exact Date)   BMI 34.11 kg/m   Physical Exam Constitutional:      Appearance: Normal appearance.  Cardiovascular:     Rate and Rhythm: Normal rate.  Pulmonary:     Effort: Pulmonary effort is normal.  Genitourinary:    General: Normal vulva.     Vagina: Normal.     Cervix: No cervical motion tenderness.     Uterus: Normal.       Adnexa:        Right: Tenderness and fullness present. No mass.         Left: No mass, tenderness or fullness.    Skin:    General: Skin is warm and dry.  Neurological:     General: No focal deficit present.     Mental Status: She  is alert and oriented to person, place, and time.  Psychiatric:        Mood and Affect: Mood normal.        Behavior: Behavior normal.     Results: No results found for this or any previous visit (from the past 24 hour(s)).   Assessment/Plan: Dyspareunia in female - Plan: Cervicovaginal ancillary only, Urinalysis, Routine w reflex microscopic, US PELVIS TRANSVAGINAL NON-OB (TV ONLY), US PELVIS LIMITED (TRANSABDOMINAL ONLY), Estradiol, FSH/LH, Progesterone, TSH  Infertility management - Plan: US PELVIS TRANSVAGINAL NON-OB (TV ONLY), US PELVIS LIMITED (TRANSABDOMINAL ONLY), Estradiol, FSH/LH, Progesterone, TSH, Prolactin    Meds ordered this encounter  Medications   Fe Fum-Fe Poly-Vit C-Lactobac (FUSION) 65-65-25-30 MG CAPS    Sig: Take 1 tablet by mouth daily.    Dispense:  90 capsule    Refill:  3    Order Specific Question:   Supervising Provider    Answer:   Hildred Laser [[id number]]   Return for pelvic ultrasound to assess right ovary given tenderness & enlargement. Return for day 3 labs Reviewed timing of IC around ovulation to improve chances of conception Avoidance of teratogens for both Michele Bailey & Laquavius and increasing chance of conception reviewed. Desires to proceed with sperm analysis, order placed & instructions & cup provided. Start PNV, prescribed today. Given hx of preterm delivery as well as pre-eclampsia would recommend 81mg  ASA daily at 12w EGA in future pregnancy.  Dominica Severin, CNM 07/01/2022 1:10 PM

## 2022-07-02 LAB — CERVICOVAGINAL ANCILLARY ONLY
Bacterial Vaginitis (gardnerella): NEGATIVE
Candida Glabrata: NEGATIVE
Candida Vaginitis: NEGATIVE
Comment: NEGATIVE
Comment: NEGATIVE
Comment: NEGATIVE

## 2022-07-03 ENCOUNTER — Inpatient Hospital Stay: Admission: RE | Admit: 2022-07-03 | Payer: Self-pay | Source: Ambulatory Visit

## 2022-07-04 ENCOUNTER — Ambulatory Visit: Payer: Self-pay

## 2022-07-04 ENCOUNTER — Other Ambulatory Visit: Payer: Self-pay | Admitting: Certified Nurse Midwife

## 2022-07-04 ENCOUNTER — Telehealth: Payer: Self-pay

## 2022-07-04 MED ORDER — PREPLUS 27-1 MG PO TABS: 1.0000 | ORAL_TABLET | Freq: Every day | ORAL | 3 refills | Status: AC

## 2022-07-04 NOTE — Telephone Encounter (Signed)
Patient called [facility] is not covered by her insurance. She would like alternate medication called to her pharmacy. I gave her 3 months worth of samples.  Please advise.

## 2022-07-17 ENCOUNTER — Ambulatory Visit
Admission: RE | Admit: 2022-07-17 | Discharge: 2022-07-17 | Disposition: A | Discharge status: Home / Self Care | Payer: Medicaid Other | Source: Ambulatory Visit | Attending: Certified Nurse Midwife | Admitting: Certified Nurse Midwife

## 2022-07-17 DIAGNOSIS — N941 Unspecified dyspareunia: Secondary | ICD-10-CM | POA: Diagnosis present

## 2022-07-17 DIAGNOSIS — Z319 Encounter for procreative management, unspecified: Secondary | ICD-10-CM | POA: Insufficient documentation

## 2022-07-22 ENCOUNTER — Telehealth: Payer: Self-pay

## 2022-07-22 NOTE — Telephone Encounter (Signed)
Patient calling to inquire about ultrasound results. Please advise.

## 2022-07-31 ENCOUNTER — Other Ambulatory Visit: Payer: Self-pay | Admitting: Certified Nurse Midwife

## 2022-07-31 DIAGNOSIS — N83209 Unspecified ovarian cyst, unspecified side: Secondary | ICD-10-CM

## 2022-08-30 ENCOUNTER — Other Ambulatory Visit: Payer: Medicaid Other

## 2022-08-30 ENCOUNTER — Telehealth: Payer: Self-pay | Admitting: Certified Nurse Midwife

## 2022-08-30 NOTE — Telephone Encounter (Signed)
Reached out to pt to reschedule gyn [place] that was scheduled on 08/30/2022 at 4:00.  (Ordered by J. Keitha Butte)  Could not leave message.

## 2022-09-02 ENCOUNTER — Encounter: Payer: Self-pay | Admitting: Certified Nurse Midwife

## 2022-09-02 NOTE — Telephone Encounter (Signed)
Reached out to pt (2x) to reschedule gyn [place] that was scheduled on 08/30/2022 at 4:00.  Left message

## 2022-09-02 NOTE — Telephone Encounter (Signed)
Reached out to pt (2x) to reschedule gyn [place] that was scheduled on 08/30/2022 at 4:00. Left message for pt to call back to reschedule.  Will send a MyChart letter.

## 2022-09-30 ENCOUNTER — Ambulatory Visit (INDEPENDENT_AMBULATORY_CARE_PROVIDER_SITE_OTHER): Payer: Medicaid Other

## 2022-09-30 VITALS — BP 131/106 | HR 63 | Ht 64.0 in | Wt 195.9 lb

## 2022-09-30 DIAGNOSIS — Z3201 Encounter for pregnancy test, result positive: Secondary | ICD-10-CM

## 2022-09-30 DIAGNOSIS — N926 Irregular menstruation, unspecified: Secondary | ICD-10-CM

## 2022-09-30 LAB — POCT URINE PREGNANCY: Preg Test, Ur: POSITIVE — AB

## 2022-09-30 NOTE — Progress Notes (Signed)
NURSE VISIT NOTE  Subjective:    Patient ID: Michele Bailey, female    DOB: September 27, 1995, 27 y.o.   MRN: [id number]  HPI  Patient is a 27 y.o. [id number] female who presents for evaluation of amenorrhea. She believes she could be pregnant. Pregnancy is desired. Sexual Activity: single partner, contraception: none. Current symptoms also include:  Last period was normal.    Objective:    BP (!) 131/106   Pulse 63   Ht 5\' 4"  (1.626 m)   Wt 195 lb 14.4 oz (88.9 kg)   LMP 08/17/2022 (Exact Date)   BMI 33.63 kg/m   Lab Review  No results found for any visits on 09/30/22.  Assessment:   1. Missed period     Plan:   Pregnancy Test: Positive  Estimated Date of Delivery: 05/24/2023 GA: [redacted]w[redacted]d  Burtis Junes, CMA

## 2022-10-02 ENCOUNTER — Other Ambulatory Visit: Payer: Self-pay | Admitting: Certified Nurse Midwife

## 2022-10-02 ENCOUNTER — Ambulatory Visit (INDEPENDENT_AMBULATORY_CARE_PROVIDER_SITE_OTHER): Payer: Medicaid Other

## 2022-10-02 DIAGNOSIS — N83209 Unspecified ovarian cyst, unspecified side: Secondary | ICD-10-CM

## 2022-10-02 DIAGNOSIS — Z3687 Encounter for antenatal screening for uncertain dates: Secondary | ICD-10-CM

## 2022-10-02 DIAGNOSIS — Z3A01 Less than 8 weeks gestation of pregnancy: Secondary | ICD-10-CM

## 2022-10-16 ENCOUNTER — Telehealth: Payer: Self-pay | Admitting: Certified Nurse Midwife

## 2022-10-16 ENCOUNTER — Ambulatory Visit: Payer: Medicaid Other

## 2022-10-16 NOTE — Telephone Encounter (Signed)
I contacted the patient via phone x2. No answer, Call could not be completed as dialed. The patient was scheduled today 10/16/22 at 10:15 am. Due to staffing needed to rescheduled. I have the patient moved to Friday, 10/4 at 11:15 am for the next opening available.

## 2022-10-17 NOTE — Telephone Encounter (Signed)
Patient is aware via my chart 

## 2022-10-18 ENCOUNTER — Ambulatory Visit: Payer: Medicaid Other

## 2022-10-22 ENCOUNTER — Telehealth: Payer: Self-pay | Admitting: Certified Nurse Midwife

## 2022-10-22 NOTE — Telephone Encounter (Signed)
The patient missed [facility] intake for 10/18/22. I contacted the patient via phone. I left generic message with a a family member asking the patient to contact our office.

## 2022-10-23 ENCOUNTER — Encounter: Payer: Self-pay | Admitting: Certified Nurse Midwife

## 2022-10-23 NOTE — Telephone Encounter (Signed)
Reached out to pt (2x)  to reschedule [facility] intake appt that was scheduled on 10/18/2022.  Left message for pt to call back to reschedule.

## 2022-10-25 ENCOUNTER — Ambulatory Visit: Payer: Medicaid Other

## 2022-10-25 DIAGNOSIS — Z3689 Encounter for other specified antenatal screening: Secondary | ICD-10-CM

## 2022-10-25 DIAGNOSIS — Z3480 Encounter for supervision of other normal pregnancy, unspecified trimester: Secondary | ICD-10-CM | POA: Insufficient documentation

## 2022-10-25 NOTE — Patient Instructions (Signed)
Second Trimester of Pregnancy  The second trimester of pregnancy is from week 13 through week 27. This is months 4 through 6 of pregnancy. The second trimester is often a time when you feel your best. Your body has adjusted to being pregnant, and you begin to feel better physically. During the second trimester: Morning sickness has lessened or stopped completely. You may have more energy. You may have an increase in appetite. The second trimester is also a time when the unborn baby (fetus) is growing rapidly. At the end of the sixth month, the fetus may be up to 12 inches long and weigh about 1 pounds. You will likely begin to feel the baby move (quickening) between 16 and 20 weeks of pregnancy. Body changes during your second trimester Your body continues to go through many changes during your second trimester. The changes vary and generally return to normal after the baby is born. Physical changes Your weight will continue to increase. You will notice your lower abdomen bulging out. You may begin to get stretch marks on your hips, abdomen, and breasts. Your breasts will continue to grow and to become tender. Dark spots or blotches (chloasma or mask of pregnancy) may develop on your face. A dark line from your belly button to the pubic area (linea nigra) may appear. You may have changes in your hair. These can include thickening of your hair, rapid growth, and changes in texture. Some people also have hair loss during or after pregnancy, or hair that feels dry or thin. Health changes You may develop headaches. You may have heartburn. You may develop constipation. You may develop hemorrhoids or swollen, bulging veins (varicose veins). Your gums may bleed and may be sensitive to brushing and flossing. You may urinate more often because the fetus is pressing on your bladder. You may have back pain. This is caused by: Weight gain. Pregnancy hormones that are relaxing the joints in your  pelvis. A shift in weight and the muscles that support your balance. Follow these instructions at home: Medicines Follow your health care provider's instructions regarding medicine use. Specific medicines may be either safe or unsafe to take during pregnancy. Do not take any medicines unless approved by your health care provider. Take a prenatal vitamin that contains at least 600 micrograms (mcg) of folic acid. Eating and drinking Eat a healthy diet that includes fresh fruits and vegetables, whole grains, good sources of protein such as meat, eggs, or tofu, and low-fat dairy products. Avoid raw meat and unpasteurized juice, milk, and cheese. These carry germs that can harm you and your baby. You may need to take these actions to prevent or treat constipation: Drink enough fluid to keep your urine pale yellow. Eat foods that are high in fiber, such as beans, whole grains, and fresh fruits and vegetables. Limit foods that are high in fat and processed sugars, such as fried or sweet foods. Activity Exercise only as directed by your health care provider. Most people can continue their usual exercise routine during pregnancy. Try to exercise for 30 minutes at least 5 days a week. Stop exercising if you develop contractions in your uterus. Stop exercising if you develop pain or cramping in the lower abdomen or lower back. Avoid exercising if it is very hot or humid or if you are at a high altitude. Avoid heavy lifting. If you choose to, you may have sex unless your health care provider tells you not to. Relieving pain and discomfort Wear a supportive   bra to prevent discomfort from breast tenderness. Take warm sitz baths to soothe any pain or discomfort caused by hemorrhoids. Use hemorrhoid cream if your health care provider approves. Rest with your legs raised (elevated) if you have leg cramps or low back pain. If you develop varicose veins: Wear support hose as told by your health care  provider. Elevate your feet for 15 minutes, 3-4 times a day. Limit salt in your diet. Safety Wear your seat belt at all times when driving or riding in a car. Talk with your health care provider if someone is verbally or physically abusive to you. Lifestyle Do not use hot tubs, steam rooms, or saunas. Do not douche. Do not use tampons or scented sanitary pads. Avoid cat litter boxes and soil used by cats. These carry germs that can cause birth defects in the baby and possibly loss of the fetus by miscarriage or stillbirth. Do not use herbal remedies, alcohol, illegal drugs, or medicines that are not approved by your health care provider. Chemicals in these products can harm your baby. Do not use any products that contain nicotine or tobacco, such as cigarettes, e-cigarettes, and chewing tobacco. If you need help quitting, ask your health care provider. General instructions During a routine prenatal visit, your health care provider will do a physical exam and other tests. He or she will also discuss your overall health. Keep all follow-up visits. This is important. Ask your health care provider for a referral to a local prenatal education class. Ask for help if you have counseling or nutritional needs during pregnancy. Your health care provider can offer advice or refer you to specialists for help with various needs. Where to find more information American Pregnancy Association: americanpregnancy.org American College of Obstetricians and Gynecologists: acog.org/en/Womens%20Health/Pregnancy Office on Women's Health: womenshealth.gov/pregnancy Contact a health care provider if you have: A headache that does not go away when you take medicine. Vision changes or you see spots in front of your eyes. Mild pelvic cramps, pelvic pressure, or nagging pain in the abdominal area. Persistent nausea, vomiting, or diarrhea. A bad-smelling vaginal discharge or foul-smelling urine. Pain when you  urinate. Sudden or extreme swelling of your face, hands, ankles, feet, or legs. A fever. Get help right away if you: Have fluid leaking from your vagina. Have spotting or bleeding from your vagina. Have severe abdominal cramping or pain. Have difficulty breathing. Have chest pain. Have fainting spells. Have not felt your baby move for the time period told by your health care provider. Have new or increased pain, swelling, or redness in an arm or leg. Summary The second trimester of pregnancy is from week 13 through week 27 (months 4 through 6). Do not use herbal remedies, alcohol, illegal drugs, or medicines that are not approved by your health care provider. Chemicals in these products can harm your baby. Exercise only as directed by your health care provider. Most people can continue their usual exercise routine during pregnancy. Keep all follow-up visits. This is important. This information is not intended to replace advice given to you by your health care provider. Make sure you discuss any questions you have with your health care provider. Document Revised: 06/09/2019 Document Reviewed: 04/15/2019 Elsevier Patient Education  2024 [employer]. First Trimester of Pregnancy  The first trimester of pregnancy starts on the first day of your last menstrual period until the end of week 12. This is also called months 1 through 3 of pregnancy. Body changes during your first trimester Your   body goes through many changes during pregnancy. The changes usually return to normal after your baby is born. Physical changes You may gain or lose weight. Your breasts may grow larger and hurt. The area around your nipples may get darker. Dark spots or blotches may develop on your face. You may have changes in your hair. Health changes You may feel like you might vomit (nauseous), and you may vomit. You may have heartburn. You may have headaches. You may have trouble pooping (constipation). Your  gums may bleed. Other changes You may get tired easily. You may pee (urinate) more often. Your menstrual periods will stop. You may not feel hungry. You may want to eat certain kinds of food. You may have changes in your emotions from day to day. You may have more dreams. Follow these instructions at home: Medicines Take over-the-counter and prescription medicines only as told by your doctor. Some medicines are not safe during pregnancy. Take a prenatal vitamin that contains at least 600 micrograms (mcg) of folic acid. Eating and drinking Eat healthy meals that include: Fresh fruits and vegetables. Whole grains. Good sources of protein, such as meat, eggs, or tofu. Low-fat dairy products. Avoid raw meat and unpasteurized juice, milk, and cheese. If you feel like you may vomit, or you vomit: Eat 4 or 5 small meals a day instead of 3 large meals. Try eating a few soda crackers. Drink liquids between meals instead of during meals. You may need to take these actions to prevent or treat trouble pooping: Drink enough fluids to keep your pee (urine) pale yellow. Eat foods that are high in fiber. These include beans, whole grains, and fresh fruits and vegetables. Limit foods that are high in fat and sugar. These include fried or sweet foods. Activity Exercise only as told by your doctor. Most people can do their usual exercise routine during pregnancy. Stop exercising if you have cramps or pain in your lower belly (abdomen) or low back. Do not exercise if it is too hot or too humid, or if you are in a place of great height (high altitude). Avoid heavy lifting. If you choose to, you may have sex unless your doctor tells you not to. Relieving pain and discomfort Wear a good support bra if your breasts are sore. Rest with your legs raised (elevated) if you have leg cramps or low back pain. If you have bulging veins (varicose veins) in your legs: Wear support hose as told by your  doctor. Raise your feet for 15 minutes, 3-4 times a day. Limit salt in your food. Safety Wear your seat belt at all times when you are in a car. Talk with your doctor if someone is hurting you or yelling at you. Talk with your doctor if you are feeling sad or have thoughts of hurting yourself. Lifestyle Do not use hot tubs, steam rooms, or saunas. Do not douche. Do not use tampons or scented sanitary pads. Do not use herbal medicines, illegal drugs, or medicines that are not approved by your doctor. Do not drink alcohol. Do not smoke or use any products that contain nicotine or tobacco. If you need help quitting, ask your doctor. Avoid cat litter boxes and soil that is used by cats. These carry germs that can cause harm to the baby and can cause a loss of your baby by miscarriage or stillbirth. General instructions Keep all follow-up visits. This is important. Ask for help if you need counseling or if you need help with   nutrition. Your doctor can give you advice or tell you where to go for help. Visit your dentist. At home, brush your teeth with a soft toothbrush. Floss gently. Write down your questions. Take them to your prenatal visits. Where to find more information American Pregnancy Association: americanpregnancy.org American College of Obstetricians and Gynecologists: www.acog.org Office on Women's Health: womenshealth.gov/pregnancy Contact a doctor if: You are dizzy. You have a fever. You have mild cramps or pressure in your lower belly. You have a nagging pain in your belly area. You continue to feel like you may vomit, you vomit, or you have watery poop (diarrhea) for 24 hours or longer. You have a bad-smelling fluid coming from your vagina. You have pain when you pee. You are exposed to a disease that spreads from person to person, such as chickenpox, measles, Zika virus, HIV, or hepatitis. Get help right away if: You have spotting or bleeding from your vagina. You have  very bad belly cramping or pain. You have shortness of breath or chest pain. You have any kind of injury, such as from a fall or a car crash. You have new or increased pain, swelling, or redness in an arm or leg. Summary The first trimester of pregnancy starts on the first day of your last menstrual period until the end of week 12 (months 1 through 3). Eat 4 or 5 small meals a day instead of 3 large meals. Do not smoke or use any products that contain nicotine or tobacco. If you need help quitting, ask your doctor. Keep all follow-up visits. This information is not intended to replace advice given to you by your health care provider. Make sure you discuss any questions you have with your health care provider. Document Revised: 06/09/2019 Document Reviewed: 04/15/2019 Elsevier Patient Education  2024 Elsevier Inc. Commonly Asked Questions During Pregnancy  Cats: A parasite can be excreted in cat feces.  To avoid exposure you need to have another person empty the little box.  If you must empty the litter box you will need to wear gloves.  Wash your hands after handling your cat.  This parasite can also be found in raw or undercooked meat so this should also be avoided.  Colds, Sore Throats, Flu: Please check your medication sheet to see what you can take for symptoms.  If your symptoms are unrelieved by these medications please call the office.  Dental Work: Most any dental work your dentist recommends is permitted.  X-rays should only be taken during the first trimester if absolutely necessary.  Your abdomen should be shielded with a lead apron during all x-rays.  Please notify your provider prior to receiving any x-rays.  Novocaine is fine; gas is not recommended.  If your dentist requires a note from us prior to dental work please call the office and we will provide one for you.  Exercise: Exercise is an important part of staying healthy during your pregnancy.  You may continue most exercises  you were accustomed to prior to pregnancy.  Later in your pregnancy you will most likely notice you have difficulty with activities requiring balance like riding a bicycle.  It is important that you listen to your body and avoid activities that put you at a higher risk of falling.  Adequate rest and staying well hydrated are a must!  If you have questions about the safety of specific activities ask your provider.    Exposure to Children with illness: Try to avoid obvious exposure; report any   symptoms to us when noted,  If you have chicken pos, red measles or mumps, you should be immune to these diseases.   Please do not take any vaccines while pregnant unless you have checked with your OB provider.  Fetal Movement: After 28 weeks we recommend you do "kick counts" twice daily.  Lie or sit down in a calm quiet environment and count your baby movements "kicks".  You should feel your baby at least 10 times per hour.  If you have not felt 10 kicks within the first hour get up, walk around and have something sweet to eat or drink then repeat for an additional hour.  If count remains less than 10 per hour notify your provider.  Fumigating: Follow your pest control agent's advice as to how long to stay out of your home.  Ventilate the area well before re-entering.  Hemorrhoids:   Most over-the-counter preparations can be used during pregnancy.  Check your medication to see what is safe to use.  It is important to use a stool softener or fiber in your diet and to drink lots of liquids.  If hemorrhoids seem to be getting worse please call the office.   Hot Tubs:  Hot tubs Jacuzzis and saunas are not recommended while pregnant.  These increase your internal body temperature and should be avoided.  Intercourse:  Sexual intercourse is safe during pregnancy as long as you are comfortable, unless otherwise advised by your provider.  Spotting may occur after intercourse; report any bright red bleeding that is heavier  than spotting.  Labor:  If you know that you are in labor, please go to the hospital.  If you are unsure, please call the office and let us help you decide what to do.  Lifting, straining, etc:  If your job requires heavy lifting or straining please check with your provider for any limitations.  Generally, you should not lift items heavier than that you can lift simply with your hands and arms (no back muscles)  Painting:  Paint fumes do not harm your pregnancy, but may make you ill and should be avoided if possible.  Latex or water based paints have less odor than oils.  Use adequate ventilation while painting.  Permanents & Hair Color:  Chemicals in hair dyes are not recommended as they cause increase hair dryness which can increase hair loss during pregnancy.  " Highlighting" and permanents are allowed.  Dye may be absorbed differently and permanents may not hold as well during pregnancy.  Sunbathing:  Use a sunscreen, as skin burns easily during pregnancy.  Drink plenty of fluids; avoid over heating.  Tanning Beds:  Because their possible side effects are still unknown, tanning beds are not recommended.  Ultrasound Scans:  Routine ultrasounds are performed at approximately 20 weeks.  You will be able to see your baby's general anatomy an if you would like to know the gender this can usually be determined as well.  If it is questionable when you conceived you may also receive an ultrasound early in your pregnancy for dating purposes.  Otherwise ultrasound exams are not routinely performed unless there is a medical necessity.  Although you can request a scan we ask that you pay for it when conducted because insurance does not cover " patient request" scans.  Work: If your pregnancy proceeds without complications you may work until your due date, unless your physician or employer advises otherwise.  Round Ligament Pain/Pelvic Discomfort:  Sharp, shooting pains not associated   with bleeding are  fairly common, usually occurring in the second trimester of pregnancy.  They tend to be worse when standing up or when you remain standing for long periods of time.  These are the result of pressure of certain pelvic ligaments called "round ligaments".  Rest, Tylenol and heat seem to be the most effective relief.  As the womb and fetus grow, they rise out of the pelvis and the discomfort improves.  Please notify the office if your pain seems different than that described.  It may represent a more serious condition.  Common Medications Safe in Pregnancy  Acne:      Constipation:  Benzoyl Peroxide     Colace  Clindamycin      Dulcolax Suppository  Topica Erythromycin     Fibercon  Salicylic Acid      Metamucil         Miralax AVOID:        Senakot   Accutane    Cough:  Retin-A       Cough Drops  Tetracycline      Phenergan w/ Codeine if Rx  Minocycline      Robitussin (Plain & DM)  Antibiotics:     Crabs/Lice:  Ceclor       RID  Cephalosporins    AVOID:  E-Mycins      Kwell  Keflex  Macrobid/Macrodantin   Diarrhea:  Penicillin      Kao-Pectate  Zithromax      Imodium AD         PUSH FLUIDS AVOID:       Cipro     Fever:  Tetracycline      Tylenol (Regular or Extra  Minocycline       Strength)  Levaquin      Extra Strength-Do not          Exceed 8 tabs/24 hrs Caffeine:        <200mg/day (equiv. To 1 cup of coffee or  approx. 3 12 oz sodas)         Gas: Cold/Hayfever:       Gas-X  Benadryl      Mylicon  Claritin       Phazyme  **Claritin-D        Chlor-Trimeton    Headaches:  Dimetapp      ASA-Free Excedrin  Drixoral-Non-Drowsy     Cold Compress  Mucinex (Guaifenasin)     Tylenol (Regular or Extra  Sudafed/Sudafed-12 Hour     Strength)  **Sudafed PE Pseudoephedrine   Tylenol Cold & Sinus     Vicks Vapor Rub  Zyrtec  **AVOID if Problems With Blood Pressure         Heartburn: Avoid lying down for at least 1 hour after meals  Aciphex      Maalox     Rash:  Milk of  Magnesia     Benadryl    Mylanta       1% Hydrocortisone Cream  Pepcid  Pepcid Complete   Sleep Aids:  Prevacid      Ambien   Prilosec       Benadryl  Rolaids       Chamomile Tea  Tums (Limit 4/day)     Unisom         Tylenol PM         Warm milk-add vanilla or  Hemorrhoids:       Sugar for taste  Anusol/Anusol H.C.  (RX: Analapram 2.5%)  Sugar Substitutes:    Hydrocortisone OTC     Ok in moderation  Preparation H      Tucks        Vaseline lotion applied to tissue with wiping    Herpes:     Throat:  Acyclovir      Oragel  Famvir  Valtrex     Vaccines:         Flu Shot Leg Cramps:       *Gardasil  Benadryl      Hepatitis A         Hepatitis B Nasal Spray:       Pneumovax  Saline Nasal Spray     Polio Booster         Tetanus Nausea:       Tuberculosis test or PPD  Vitamin B6 25 mg TID   AVOID:    Dramamine      *Gardasil  Emetrol       Live Poliovirus  Ginger Root 250 mg QID    MMR (measles, mumps &  High Complex Carbs @ Bedtime    rebella)  Sea Bands-Accupressure    Varicella (Chickenpox)  Unisom 1/2 tab TID     *No known complications           If received before Pain:         Known pregnancy;   Darvocet       Resume series after  Lortab        Delivery  Percocet    Yeast:   Tramadol      Femstat  Tylenol 3      Gyne-lotrimin  Ultram       Monistat  Vicodin           MISC:         All Sunscreens           Hair Coloring/highlights          Insect Repellant's          (Including DEET)         Mystic Tans  

## 2022-10-25 NOTE — Telephone Encounter (Signed)
Pt is scheduled for the NOB nurse intake appt on 10/25/2022 at 3:15.

## 2022-10-25 NOTE — Telephone Encounter (Signed)
-----   Message from [facility] [place] [facility] sent at 10/25/2022  3:49 PM EDT ----- Merla Riches,  please schedule BP check c nurse beginning of next week unless there is an opening today.  Thanks.

## 2022-10-25 NOTE — Progress Notes (Signed)
I called the patient 2 times and didn't get an answer and left message for patient to call us back.

## 2022-10-25 NOTE — Progress Notes (Signed)
New OB Intake  I connected with  Michele Bailey on 10/25/22 at  3:15 PM EDT by telephone Video Visit and verified that I am speaking with the correct person using two identifiers. Nurse is located at [facility] and pt is located at home.  I explained I am completing New OB Intake today. We discussed her EDD of 05/24/2023 that is based on LMP of 08/17/2022. Pt is [id number]/P1142. I reviewed her allergies, medications, Medical/Surgical/OB history, and appropriate screenings. There are no cats in the home.  Based on history, this is a/an pregnancy uncomplicated .   Patient Active Problem List   Diagnosis Date Noted   Supervision of other normal pregnancy, antepartum 10/25/2022   Chest pain 12/21/2021   Hair loss 08/20/2021   Breast pain, right 04/20/2021   Cystitis 10/31/2020    Concerns addressed today Constipation; adv per new protocol.  Pt is concerned about her blood pressure; states that when her blood pressure is up she starts cramping; hx of preeclampsia; doesn't have a way to check BP at home; adv to get her a cuff and sent a message to the schedulers to schedule her for a nurse visit for BP check.  Delivery Plans:  Plans to deliver at [facility].  Anatomy US Explained first scheduled [place] was done 10/02/22 and an anatomy scan will be done at 20 weeks.  Labs Discussed genetic screening with patient. Patient desires genetic testing to be drawn at new OB visit. Discussed possible labs to be drawn at new OB appointment.  COVID Vaccine Patient has not had COVID vaccine.   Social Determinants of Health Food Insecurity: denies food insecurity Transportation: Patient denies transportation needs.  First visit review I reviewed new OB appt with pt. I explained she will have ob bloodwork and pap smear/pelvic exam if indicated. Explained pt will be seen by Hartley Barefoot, CNM at first visit; encounter routed to appropriate provider.   Loran Senters, [facility] 10/25/2022  4:01  PM

## 2022-10-28 ENCOUNTER — Encounter: Payer: Medicaid Other | Admitting: Obstetrics

## 2022-10-28 ENCOUNTER — Telehealth: Payer: Self-pay | Admitting: Obstetrics

## 2022-10-28 NOTE — Telephone Encounter (Signed)
Reached out to pt to reschedule ROB?? Appt that was scheduled on 10/28/2022 at 3:35 with Dr. Lonny Prude. Left message for pt to call back to reschedule.

## 2022-10-28 NOTE — Progress Notes (Deleted)
    Return Prenatal Note   Subjective   26 y.o. [id number] at [redacted]w[redacted]d presents for blood pressure check.  Patient *** Patient reports:    Objective   Flow sheet Vitals:   Total weight gain: 0 lb (0 kg)  General Appearance  No acute distress, well appearing, and well nourished Pulmonary   Normal work of breathing Neurologic   Alert and oriented to person, place, and time Psychiatric   Mood and affect within normal limits  Assessment/Plan   Plan  27 y.o. [id number] at [redacted]w[redacted]d presents for follow-up OB visit. Reviewed prenatal record including previous visit note. There are no diagnoses linked to this encounter.   No orders of the defined types were placed in this encounter.  No follow-ups on file.   Future Appointments  Date Time Provider Department Center  10/28/2022  3:35 PM [name], MD [facility] None  11/14/2022  9:55 AM Dominica Severin, CNM [facility] None    For next visit:  {SJFprenatalcare:[id number]}     Micia Roby,DO 10/14/242:37 PM

## 2022-10-29 ENCOUNTER — Encounter: Payer: Self-pay | Admitting: Obstetrics

## 2022-10-29 NOTE — Telephone Encounter (Signed)
Reached out to pt (2x) to reschedule ROB?? Appt that was scheduled on 10/28/2022 at ;34 with Dr. Lonny Prude.  Left message for pt to call back to reschedule.  Will send a MyChart letter to pt.

## 2022-11-04 ENCOUNTER — Ambulatory Visit: Payer: Medicaid Other | Admitting: Obstetrics & Gynecology

## 2022-11-04 ENCOUNTER — Encounter: Payer: Self-pay | Admitting: Obstetrics & Gynecology

## 2022-11-04 VITALS — BP 125/86 | HR 72 | Ht 64.0 in | Wt 191.8 lb

## 2022-11-04 DIAGNOSIS — N949 Unspecified condition associated with female genital organs and menstrual cycle: Secondary | ICD-10-CM

## 2022-11-04 DIAGNOSIS — O99211 Obesity complicating pregnancy, first trimester: Secondary | ICD-10-CM | POA: Diagnosis not present

## 2022-11-04 DIAGNOSIS — B009 Herpesviral infection, unspecified: Secondary | ICD-10-CM | POA: Insufficient documentation

## 2022-11-04 DIAGNOSIS — Z3A11 11 weeks gestation of pregnancy: Secondary | ICD-10-CM

## 2022-11-04 DIAGNOSIS — E669 Obesity, unspecified: Secondary | ICD-10-CM | POA: Diagnosis not present

## 2022-11-04 DIAGNOSIS — O99210 Obesity complicating pregnancy, unspecified trimester: Secondary | ICD-10-CM | POA: Insufficient documentation

## 2022-11-04 DIAGNOSIS — O99891 Other specified diseases and conditions complicating pregnancy: Secondary | ICD-10-CM | POA: Diagnosis not present

## 2022-11-04 MED ORDER — ASPIRIN 81 MG PO CHEW: 81.0000 mg | CHEWABLE_TABLET | Freq: Every day | ORAL | 3 refills | Status: DC

## 2022-11-04 MED ORDER — VALACYCLOVIR HCL 500 MG PO TABS: ORAL_TABLET | ORAL | 12 refills | Status: DC

## 2022-11-04 NOTE — Progress Notes (Signed)
    GYNECOLOGY PROGRESS NOTE  Subjective:    Patient ID: Michele Bailey, female    DOB: Mar 19, 1995, 27 y.o.   MRN: [id number]  HPI  Patient is a 27 y.o. [id number] at about 8 1/[redacted] weeks EGA who presents with severe constipation- no decent BM for about 3 weeks. She has some occasional "rabbit pellets" but very minimal. She has not tried any meds as of yet.  She also has some vulvar discomfort for the last few days. She had her boyfriend take a video of her vulva and she showed me the video. I don't seen anything abnormal on the video. She has a h/o HSV and usually takes valtrex for the last month of her pregnancies.   The following portions of the patient's history were reviewed and updated as appropriate: allergies, current medications, past family history, past medical history, past social history, past surgical history, and problem list.  Review of Systems Pertinent items are noted in HPI.   Objective:   Blood pressure 125/86, pulse 72, height 5\' 4"  (1.626 m), weight 191 lb 12.8 oz (87 kg), last menstrual period 08/17/2022, not currently breastfeeding. Body mass index is 32.92 kg/m. Well nourished, well hydrated White female, no apparent distress She is ambulating and conversing normally. Her vulva is entirely normal.   Assessment:   1. Vaginal discomfort   2. Obesity in pregnancy   3.      Constipation  Plan:   1. Vaginal discomfort - I will refill her valtrex and she can use this prn.  - reassurance given  2. Obesity in pregnancy - I rec'd that she start ASA 81 mg at 13 weeks and that she gain less than 20 pounds total - she should get baseline pre E labs at her NOB visit  3. Constipation - rec Miralax prn  She has NOB visit next week

## 2022-11-14 ENCOUNTER — Other Ambulatory Visit (HOSPITAL_COMMUNITY)
Admission: RE | Admit: 2022-11-14 | Discharge: 2022-11-14 | Disposition: A | Discharge status: Home / Self Care | Payer: Medicaid Other | Source: Ambulatory Visit | Attending: Certified Nurse Midwife | Admitting: Certified Nurse Midwife

## 2022-11-14 ENCOUNTER — Encounter: Payer: Self-pay | Admitting: Certified Nurse Midwife

## 2022-11-14 ENCOUNTER — Ambulatory Visit (INDEPENDENT_AMBULATORY_CARE_PROVIDER_SITE_OTHER): Payer: Medicaid Other | Admitting: Certified Nurse Midwife

## 2022-11-14 VITALS — BP 139/84 | HR 86 | Wt 191.4 lb

## 2022-11-14 DIAGNOSIS — Z3480 Encounter for supervision of other normal pregnancy, unspecified trimester: Secondary | ICD-10-CM

## 2022-11-14 DIAGNOSIS — Z3481 Encounter for supervision of other normal pregnancy, first trimester: Secondary | ICD-10-CM | POA: Diagnosis not present

## 2022-11-14 DIAGNOSIS — O09299 Supervision of pregnancy with other poor reproductive or obstetric history, unspecified trimester: Secondary | ICD-10-CM | POA: Insufficient documentation

## 2022-11-14 DIAGNOSIS — Z369 Encounter for antenatal screening, unspecified: Secondary | ICD-10-CM

## 2022-11-14 DIAGNOSIS — Z114 Encounter for screening for human immunodeficiency virus [HIV]: Secondary | ICD-10-CM

## 2022-11-14 DIAGNOSIS — Z113 Encounter for screening for infections with a predominantly sexual mode of transmission: Secondary | ICD-10-CM

## 2022-11-14 DIAGNOSIS — Z3A12 12 weeks gestation of pregnancy: Secondary | ICD-10-CM | POA: Insufficient documentation

## 2022-11-14 DIAGNOSIS — O09899 Supervision of other high risk pregnancies, unspecified trimester: Secondary | ICD-10-CM | POA: Insufficient documentation

## 2022-11-14 MED ORDER — PSYLLIUM 58.6 % PO PACK: 1.0000 | PACK | Freq: Every day | ORAL | 12 refills | Status: DC

## 2022-11-14 NOTE — Progress Notes (Signed)
NEW OB HISTORY AND PHYSICAL  SUBJECTIVE:       Michele Bailey is a 27 y.o. [phone] female, Patient's last menstrual period was 08/17/2022 (exact date)., Estimated Date of Delivery: 05/24/23, [redacted]w[redacted]d, presents today for establishment of Prenatal Care. She reports fatigue, constipation-has gotten better, and breast tenderness   Social history Partner/Relationship: Marcell Anger Living situation: partner, her mother & 2 kids Work: [employer] Exercise: walks Substance use: denies  Indications for ASA therapy (per uptodate) One of the following: Previous pregnancy with preeclampsia, especially early onset and with an adverse outcome Yes Multifetal gestation No Chronic hypertension No Type 1 or 2 diabetes mellitus No Chronic kidney disease No Autoimmune disease (antiphospholipid syndrome, systemic lupus erythematosus) No  Two or more of the following: Nulliparity No Obesity (body mass index >30 kg/m2) Yes Family history of preeclampsia in mother or sister No Age >=35 years No Sociodemographic characteristics (African American race, low socioeconomic level) No Personal risk factors (eg, previous pregnancy with low birth weight or small for gestational age infant, previous adverse pregnancy outcome [eg, stillbirth], interval >10 years between pregnancies) Yes   Gynecologic History Patient's last menstrual period was 08/17/2022 (exact date). Normal Contraception: none Last Pap: 2023. Results were: normal  Obstetric History OB History  Gravida Para Term Preterm AB Living  7 2 1 1 4 2   SAB IAB Ectopic Multiple Live Births  2 2   0 2    # Outcome Date GA Lbr Len/2nd Weight Sex Type Anes PTL Lv  7 Current           6 SAB 12/01/21          5 Preterm 02/08/18 [redacted]w[redacted]d / 00:04 5 lb 2.2 oz (2.33 kg) M Vag-Spont EPI  LIV  4 SAB 2019          3 IAB 2018     TAB     2 Term 08/25/15 [redacted]w[redacted]d / 01:24 7 lb 4.1 oz (3.29 kg) F Vag-Spont EPI  LIV  1 IAB 2014            Obstetric Comments  MGF- had twin that  passed away age 28yr.  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 90 tablet 3   Prenatal Vit-Fe Fumarate-FA (PREPLUS) 27-1 MG TABS Take 1 tablet by mouth daily. 90 tablet 3   valACYclovir (VALTREX) 500 MG tablet Take 1 pill daily (prn) and in the last month of pregnancy 30 tablet 12   No current facility-administered medications on file prior to visit.    Allergies  Allergen Reactions   Amoxicillin-Pot Clavulanate Nausea Only    Willing to take med if necessary with nausea med  Willing to take med if necessary with nausea med  Willing to take med if necessary with nausea med  Social History   Socioeconomic History   Marital status: Significant Other    Spouse name: laquaviua   Number of children: 2   Years of education: 12   Highest education level: Not on file  Occupational History   Occupation: unemployed  Tobacco Use   Smoking status: Former    Current packs/day: 0.00    Types: Cigarettes    Quit date: 11/2021    Years since quitting: 1.0   Smokeless tobacco: Never   Tobacco comments:    quit smoking 11/2020  Vaping Use   Vaping status: Never Used  Substance and Sexual Activity   Alcohol use: No   Drug use: No   Sexual activity: Yes    Partners: Male    Birth control/protection: None    Comment: planning depo  Other Topics Concern   Not on file  Social History  Narrative   Not on file   Social Determinants of Health   Financial Resource Strain: Low Risk  (10/25/2022)   Overall Financial Resource Strain (CARDIA)    Difficulty of Paying Living Expenses: Not hard at all  Food Insecurity: No Food Insecurity (10/25/2022)   Hunger Vital Sign    Worried About Running Out of Food in the Last Year: Never true    Ran Out of Food in the Last Year: Never true  Transportation Needs: No Transportation Needs (10/25/2022)   PRAPARE - Administrator, Civil Service (Medical): No    Lack of Transportation (Non-Medical): No  Physical Activity: Insufficiently Active (10/25/2022)   Exercise Vital Sign    Days of Exercise per Week: 3 days    Minutes of Exercise per Session: 20 min  Stress: No Stress Concern Present (10/25/2022)   [employer] of Occupational Health - Occupational Stress Questionnaire    Feeling of Stress : Not at all  Social Connections: Moderately Isolated (10/25/2022)   Social Connection and Isolation Panel [NHANES]    Frequency of Communication with Friends and Family: Once a week    Frequency of Social Gatherings with Friends and Family: Once a week    Attends Religious Services: 1 to 4 times per year    Active Member of [employer] or Organizations: No    Attends Banker Meetings: Never    Marital Status: Living with partner  Intimate Partner Violence: Not At Risk (10/25/2022)   Humiliation, Afraid, Rape, and Kick questionnaire    Fear of Current or Ex-Partner: No    Emotionally Abused: No    Physically Abused: No    Sexually Abused: No    Family History  Problem Relation Age of Onset   Rheum arthritis Mother    Migraines Mother    Thyroid disease Mother        hypothyroid   Cleft palate Mother    Hyperlipidemia Father    Hypertension Father    Esophageal cancer Father 64   Healthy Maternal Grandmother    Cleft palate Maternal Grandfather    Rheum arthritis Maternal Grandfather    Cancer Maternal  Grandfather 53       liver   Cancer Paternal Grandfather 55       prostate   Ovarian cancer Other        mother's great aunt   Cancer - Other Other        fob-father died at age 56y from a type of ca that he was born with   Kidney failure Paternal Aunt     The following  portions of the patient's history were reviewed and updated as appropriate: allergies, current medications, past OB history, past medical history, past surgical history, past family history, past social history, and problem list.  Constitutional: Denied constitutional symptoms, night sweats, recent illness, fatigue, fever, insomnia and weight loss.  Eyes: Denied eye symptoms, eye pain, photophobia, vision change and visual disturbance.  Ears/Nose/Throat/Neck: Denied ear, nose, throat or neck symptoms, hearing loss, nasal discharge, sinus congestion and sore throat.  Cardiovascular: Denied cardiovascular symptoms, arrhythmia, chest pain/pressure, edema, exercise intolerance, orthopnea and palpitations.  Respiratory: Denied pulmonary symptoms, asthma, pleuritic pain, productive sputum, cough, dyspnea and wheezing.  Gastrointestinal: Denied gastro-esophageal reflux, melena, nausea and vomiting.  Genitourinary: Denied genitourinary symptoms including symptomatic vaginal discharge, pelvic relaxation issues, and urinary complaints.  Musculoskeletal: Denied musculoskeletal symptoms, stiffness, swelling, muscle weakness and myalgia.  Dermatologic: Denied dermatology symptoms, rash and scar.  Neurologic: Denied neurology symptoms, dizziness, headache, neck pain and syncope.  Psychiatric: Denied psychiatric symptoms, anxiety and depression.  Endocrine: Denied endocrine symptoms including hot flashes and night sweats.     OBJECTIVE: Initial Physical Exam (New OB)  GENERAL APPEARANCE: alert, well appearing, in no apparent distress, oriented to person, place and time HEAD: normocephalic, atraumatic BREASTS: no masses noted, no  significant tenderness, no palpable axillary nodes, no skin changes LUNGS: clear to auscultation, no wheezes, rales or rhonchi, symmetric air entry HEART: regular rate and rhythm, no murmurs ABDOMEN: soft, nontender, nondistended, no abnormal masses, no epigastric pain NEUROLOGIC: alert, oriented, normal speech, no focal findings or movement disorder noted  PELVIC EXAM deferred  ASSESSMENT: Normal pregnancy Hx preterm delivery Hx pre-eclampsia in prior pregnancy   PLAN: Routine prenatal care. We discussed an overview of prenatal care and when to call. Reviewed diet, exercise, and weight gain recommendations in pregnancy. Discussed benefits of breastfeeding and lactation resources at [facility]. I reviewed labs and answered all questions.  1. Supervision of other normal pregnancy, antepartum - POCT Urinalysis Dipstick - NOB Panel; Future - Culture, OB Urine - Monitor Drug Profile 14(MW) - Nicotine screen, urine - Urinalysis, Routine w reflex microscopic - Comprehensive metabolic panel - Hemoglobin A1c - Hgb Fractionation Cascade - Protein / creatinine ratio, urine - TSH + free T4 - MaterniT 21 plus Core, Blood - Cervicovaginal ancillary only - NOB Panel  2. [redacted] weeks gestation of pregnancy - POCT Urinalysis Dipstick - Cervicovaginal ancillary only  3. Screening examination for venereal disease  4. Screening for human immunodeficiency virus - NOB Panel; Future - NOB Panel  5. Antenatal screening encounter - NOB Panel; Future - Culture, OB Urine - Monitor Drug Profile 14(MW) - Nicotine screen, urine - Urinalysis, Routine w reflex microscopic - Comprehensive metabolic panel - Hemoglobin A1c - Hgb Fractionation Cascade - Protein / creatinine ratio, urine - TSH + free T4 - MaterniT 21 plus Core, Blood - NOB Panel   Dominica Severin, CNM

## 2022-11-14 NOTE — Patient Instructions (Signed)
 First Trimester of Pregnancy  The first trimester of pregnancy starts on the first day of your last menstrual period until the end of week 12. This is months 1 through 3 of pregnancy. A week after a sperm fertilizes an egg, the egg will implant into the wall of the uterus and begin to develop into a baby. By the end of 12 weeks, all the baby's organs will be formed and the baby will be 2-3 inches in size. Body changes during your first trimester Your body goes through many changes during pregnancy. The changes vary and generally return to normal after your baby is born. Physical changes You may gain or lose weight. Your breasts may begin to grow larger and become tender. The tissue that surrounds your nipples (areola) may become darker. Dark spots or blotches (chloasma or mask of pregnancy) may develop on your face. You may have changes in your hair. These can include thickening or thinning of your hair or changes in texture. Health changes You may feel nauseous, and you may vomit. You may have heartburn. You may develop headaches. You may develop constipation. Your gums may bleed and may be sensitive to brushing and flossing. Other changes You may tire easily. You may urinate more often. Your menstrual periods will stop. You may have a loss of appetite. You may develop cravings for certain kinds of food. You may have changes in your emotions from day to day. You may have more vivid and strange dreams. Follow these instructions at home: Medicines Follow your health care provider's instructions regarding medicine use. Specific medicines may be either safe or unsafe to take during pregnancy. Do not take any medicines unless told to by your health care provider. Take a prenatal vitamin that contains at least 600 micrograms (mcg) of folic acid. Eating and drinking Eat a healthy diet that includes fresh fruits and vegetables, whole grains, good sources of protein such as meat, eggs, or tofu,  and low-fat dairy products. Avoid raw meat and unpasteurized juice, milk, and cheese. These carry germs that can harm you and your baby. If you feel nauseous or you vomit: Eat 4 or 5 small meals a day instead of 3 large meals. Try eating a few soda crackers. Drink liquids between meals instead of during meals. You may need to take these actions to prevent or treat constipation: Drink enough fluid to keep your urine pale yellow. Eat foods that are high in fiber, such as beans, whole grains, and fresh fruits and vegetables. Limit foods that are high in fat and processed sugars, such as fried or sweet foods. Activity Exercise only as directed by your health care provider. Most people can continue their usual exercise routine during pregnancy. Try to exercise for 30 minutes at least 5 days a week. Stop exercising if you develop pain or cramping in the lower abdomen or lower back. Avoid exercising if it is very hot or humid or if you are at high altitude. Avoid heavy lifting. If you choose to, you may have sex unless your health care provider tells you not to. Relieving pain and discomfort Wear a good support bra to relieve breast tenderness. Rest with your legs elevated if you have leg cramps or low back pain. If you develop bulging veins (varicose veins) in your legs: Wear support hose as told by your health care provider. Elevate your feet for 15 minutes, 3-4 times a day. Limit salt in your diet. Safety Wear your seat belt at all times when  driving or riding in a car. Talk with your health care provider if someone is verbally or physically abusive to you. Talk with your health care provider if you are feeling sad or have thoughts of hurting yourself. Lifestyle Do not use hot tubs, steam rooms, or saunas. Do not douche. Do not use tampons or scented sanitary pads. Do not use herbal remedies, alcohol, illegal drugs, or medicines that are not approved by your health care provider. Chemicals  in these products can harm your baby. Do not use any products that contain nicotine or tobacco, such as cigarettes, e-cigarettes, and chewing tobacco. If you need help quitting, ask your health care provider. Avoid cat litter boxes and soil used by cats. These carry germs that can cause birth defects in the baby and possibly loss of the unborn baby (fetus) by miscarriage or stillbirth. General instructions During routine prenatal visits in the first trimester, your health care provider will do a physical exam, perform necessary tests, and ask you how things are going. Keep all follow-up visits. This is important. Ask for help if you have counseling or nutritional needs during pregnancy. Your health care provider can offer advice or refer you to specialists for help with various needs. Schedule a dentist appointment. At home, brush your teeth with a soft toothbrush. Floss gently. Write down your questions. Take them to your prenatal visits. Where to find more information American Pregnancy Association: americanpregnancy.org [employer] of Obstetricians and Gynecologists: https://www.todd-brady.net/ Office on [employer] Health: MightyReward[employer].nz Contact a health care provider if you have: Dizziness. A fever. Mild pelvic cramps, pelvic pressure, or nagging pain in the abdominal area. Nausea, vomiting, or diarrhea that lasts for 24 hours or longer. A bad-smelling vaginal discharge. Pain when you urinate. Known exposure to a contagious illness, such as chickenpox, measles, Zika virus, HIV, or hepatitis. Get help right away if you have: Spotting or bleeding from your vagina. Severe abdominal cramping or pain. Shortness of breath or chest pain. Any kind of trauma, such as from a fall or a car crash. New or increased pain, swelling, or redness in an arm or leg. Summary The first trimester of pregnancy starts on the first day of your last menstrual period until the end of week  12 (months 1 through 3). Eating 4 or 5 small meals a day rather than 3 large meals may help to relieve nausea and vomiting. Do not use any products that contain nicotine or tobacco, such as cigarettes, e-cigarettes, and chewing tobacco. If you need help quitting, ask your health care provider. Keep all follow-up visits. This is important. This information is not intended to replace advice given to you by your health care provider. Make sure you discuss any questions you have with your health care provider. Document Revised: 06/09/2019 Document Reviewed: 04/15/2019 Elsevier Patient Education  2024 [employer].

## 2022-11-15 LAB — CERVICOVAGINAL ANCILLARY ONLY
Bacterial Vaginitis (gardnerella): NEGATIVE
Candida Glabrata: NEGATIVE
Candida Vaginitis: NEGATIVE
Chlamydia: NEGATIVE
Comment: NEGATIVE
Comment: NEGATIVE
Comment: NEGATIVE
Comment: NEGATIVE
Comment: NEGATIVE
Comment: NORMAL
Neisseria Gonorrhea: NEGATIVE
Trichomonas: NEGATIVE

## 2022-11-15 LAB — CBC/D/PLT+RPR+RH+ABO+RUBIGG...
Antibody Screen: NEGATIVE
Basophils Absolute: 0.1 10*3/uL (ref 0.0–0.2)
Basos: 1 %
EOS (ABSOLUTE): 0.1 10*3/uL (ref 0.0–0.4)
Eos: 1 %
HCV Ab: NONREACTIVE
HIV Screen 4th Generation wRfx: NONREACTIVE
Hematocrit: 41.9 % (ref 34.0–46.6)
Hemoglobin: 13.9 g/dL (ref 11.1–15.9)
Hepatitis B Surface Ag: NEGATIVE
Immature Grans (Abs): 0 10*3/uL (ref 0.0–0.1)
Immature Granulocytes: 0 %
Lymphocytes Absolute: 2.1 10*3/uL (ref 0.7–3.1)
Lymphs: 25 %
MCH: 31.4 pg (ref 26.6–33.0)
MCHC: 33.2 g/dL (ref 31.5–35.7)
MCV: 95 fL (ref 79–97)
Monocytes Absolute: 0.5 10*3/uL (ref 0.1–0.9)
Monocytes: 6 %
Neutrophils Absolute: 5.6 10*3/uL (ref 1.4–7.0)
Neutrophils: 67 %
Platelets: 427 10*3/uL (ref 150–450)
RBC: 4.43 x10E6/uL (ref 3.77–5.28)
RDW: 12.1 % (ref 11.7–15.4)
RPR Ser Ql: NONREACTIVE
Rh Factor: POSITIVE
Rubella Antibodies, IGG: 4.33 {index} (ref >0.99)
Varicella zoster IgG: REACTIVE
WBC: 8.3 10*3/uL (ref 3.4–10.8)

## 2022-11-15 LAB — URINALYSIS, ROUTINE W REFLEX MICROSCOPIC
Bilirubin, UA: NEGATIVE
Glucose, UA: NEGATIVE
Ketones, UA: NEGATIVE
Nitrite, UA: NEGATIVE
Protein,UA: NEGATIVE
RBC, UA: NEGATIVE
Specific Gravity, UA: 1.008 (ref 1.005–1.030)
Urobilinogen, Ur: 0.2 mg/dL (ref 0.2–1.0)
pH, UA: 7 (ref 5.0–7.5)

## 2022-11-15 LAB — PROTEIN / CREATININE RATIO, URINE
Creatinine, Urine: 39.2 mg/dL
Protein, Ur: <4 mg/dL

## 2022-11-15 LAB — MICROSCOPIC EXAMINATION
Bacteria, UA: NONE SEEN
Casts: NONE SEEN /[LPF]
Epithelial Cells (non renal): >10 /[HPF] — AB (ref 0–10)
RBC, Urine: NONE SEEN /[HPF] (ref 0–2)
WBC, UA: NONE SEEN /[HPF] (ref 0–5)

## 2022-11-15 LAB — HCV INTERPRETATION

## 2022-11-16 LAB — URINE CULTURE, OB REFLEX: Organism ID, Bacteria: NO GROWTH

## 2022-11-16 LAB — CULTURE, OB URINE

## 2022-11-18 LAB — COMPREHENSIVE METABOLIC PANEL
ALT: 13 [IU]/L (ref 0–32)
AST: 16 [IU]/L (ref 0–40)
Albumin: 4.4 g/dL (ref 4.0–5.0)
Alkaline Phosphatase: 86 [IU]/L (ref 44–121)
BUN/Creatinine Ratio: 6 — ABNORMAL LOW (ref 9–23)
BUN: 4 mg/dL — ABNORMAL LOW (ref 6–20)
Bilirubin Total: <0.2 mg/dL (ref 0.0–1.2)
CO2: 21 mmol/L (ref 20–29)
Calcium: 9.9 mg/dL (ref 8.7–10.2)
Chloride: 105 mmol/L (ref 96–106)
Creatinine, Ser: 0.63 mg/dL (ref 0.57–1.00)
Globulin, Total: 2.9 g/dL (ref 1.5–4.5)
Glucose: 75 mg/dL (ref 70–99)
Potassium: 5 mmol/L (ref 3.5–5.2)
Sodium: 140 mmol/L (ref 134–144)
Total Protein: 7.3 g/dL (ref 6.0–8.5)
eGFR: 125 mL/min/{1.73_m2} (ref >59)

## 2022-11-18 LAB — TSH+FREE T4
Free T4: 0.92 ng/dL (ref 0.82–1.77)
TSH: 1.25 u[IU]/mL (ref 0.450–4.500)

## 2022-11-18 LAB — MATERNIT 21 PLUS CORE, BLOOD
Fetal Fraction: 11
Result (T21): NEGATIVE
Trisomy 13 (Patau syndrome): NEGATIVE
Trisomy 18 (Edwards syndrome): NEGATIVE
Trisomy 21 (Down syndrome): NEGATIVE

## 2022-11-18 LAB — MONITOR DRUG PROFILE 14(MW)
Amphetamine Scrn, Ur: NEGATIVE ng/mL
BARBITURATE SCREEN URINE: NEGATIVE ng/mL
BENZODIAZEPINE SCREEN, URINE: NEGATIVE ng/mL
Buprenorphine, Urine: NEGATIVE ng/mL
Cocaine (Metab) Scrn, Ur: NEGATIVE ng/mL
Creatinine(Crt), U: 42.4 mg/dL (ref 20.0–300.0)
Fentanyl, Urine: NEGATIVE pg/mL
Meperidine Screen, Urine: NEGATIVE ng/mL
Methadone Screen, Urine: NEGATIVE ng/mL
OXYCODONE+OXYMORPHONE UR QL SCN: NEGATIVE ng/mL
Opiate Scrn, Ur: NEGATIVE ng/mL
Ph of Urine: 6.6 (ref 4.5–8.9)
Phencyclidine Qn, Ur: NEGATIVE ng/mL
Propoxyphene Scrn, Ur: NEGATIVE ng/mL
SPECIFIC GRAVITY: 1.007
Tramadol Screen, Urine: NEGATIVE ng/mL

## 2022-11-18 LAB — NICOTINE SCREEN, URINE: Cotinine Ql Scrn, Ur: NEGATIVE ng/mL

## 2022-11-18 LAB — CANNABINOID (GC/MS), URINE
Cannabinoid: POSITIVE — AB
Carboxy THC (GC/MS): 43 ng/mL

## 2022-11-18 LAB — HGB FRACTIONATION CASCADE
Hgb A2: 2.4 % (ref 1.8–3.2)
Hgb A: 97.6 % (ref 96.4–98.8)
Hgb F: 0 % (ref 0.0–2.0)
Hgb S: 0 %

## 2022-11-18 LAB — HEMOGLOBIN A1C
Est. average glucose Bld gHb Est-mCnc: 114 mg/dL
Hgb A1c MFr Bld: 5.6 % (ref 4.8–5.6)

## 2022-11-28 ENCOUNTER — Encounter: Payer: Self-pay | Admitting: Certified Nurse Midwife

## 2022-12-11 ENCOUNTER — Telehealth: Payer: Self-pay | Admitting: Certified Nurse Midwife

## 2022-12-11 ENCOUNTER — Encounter: Payer: Medicaid Other | Admitting: Certified Nurse Midwife

## 2022-12-11 NOTE — Telephone Encounter (Signed)
Reached out to pt to reschedule ROB appt that was scheduled on 12/11/2022 at 8:55 with Pattricia Boss.  Could not leave a message bc mailbox was full.

## 2022-12-16 ENCOUNTER — Encounter: Payer: Self-pay | Admitting: Certified Nurse Midwife

## 2022-12-16 NOTE — Telephone Encounter (Signed)
Reached out to pt (2x) to reschedule ROB appt that was scheduled on 12/11/2022 at 8:55 with Pattricia Boss.  Left message for pt to call back to reschedule.  Will send a MyChart letter.

## 2022-12-20 ENCOUNTER — Ambulatory Visit (INDEPENDENT_AMBULATORY_CARE_PROVIDER_SITE_OTHER): Payer: Medicaid Other | Admitting: Certified Nurse Midwife

## 2022-12-20 VITALS — BP 129/85 | HR 70 | Wt 194.0 lb

## 2022-12-20 DIAGNOSIS — Z3A17 17 weeks gestation of pregnancy: Secondary | ICD-10-CM

## 2022-12-20 LAB — POCT URINALYSIS DIPSTICK OB
Bilirubin, UA: NEGATIVE
Blood, UA: NEGATIVE
Glucose, UA: NEGATIVE
Ketones, UA: NEGATIVE
Leukocytes, UA: NEGATIVE
Nitrite, UA: NEGATIVE
POC,PROTEIN,UA: NEGATIVE
Spec Grav, UA: 1.02 (ref 1.010–1.025)
Urobilinogen, UA: 0.2 U/dL
pH, UA: 6.5 (ref 5.0–8.0)

## 2022-12-20 NOTE — Patient Instructions (Signed)
Round Ligament Pain  The round ligaments are a pair of cord-like tissues that help support the uterus. They can become a source of pain during pregnancy as the ligaments soften and stretch as the baby grows. The pain usually begins in the second trimester (13-28 weeks) of pregnancy, and should only last for a few seconds when it occurs. However, the pain can come and go until the baby is delivered. The pain does not cause harm to the baby. Round ligament pain is usually a short, sharp, and pinching pain, but it can also be a dull, lingering, and aching pain. The pain is felt in the lower side of the abdomen or in the groin. It usually starts deep in the groin and moves up to the outside of the hip area. The pain may happen when you: Suddenly change position, such as quickly going from a sitting to standing position. Do physical activity. Cough or sneeze. Follow these instructions at home: Managing pain  When the pain starts, relax. Then, try any of these methods to help with the pain: Sit down. Flex your knees up to your abdomen. Lie on your side with one pillow under your abdomen and another pillow between your legs. Sit in a warm bath for 15-20 minutes or until the pain goes away. General instructions Watch your condition for any changes. Move slowly when you sit down or stand up. Stop or reduce your physical activities if they cause pain. Avoid long walks if they cause pain. Take over-the-counter and prescription medicines only as told by your health care provider. Keep all follow-up visits. This is important. Contact a health care provider if: Your pain does not go away with treatment. You feel pain in your back that you did not have before. Your medicine is not helping. You have a fever or chills. You have nausea or vomiting. You have diarrhea. You have pain when you urinate. Get help right away if: You have pain that is a rhythmic, cramping pain similar to labor pains. Labor  pains are usually 2 minutes apart, last for about 1 minute, and involve a bearing down feeling or pressure in your pelvis. You have vaginal bleeding. These symptoms may represent a serious problem that is an emergency. Do not wait to see if the symptoms will go away. Get medical help right away. Call your local emergency services (911 in the [place] Do not drive yourself to the hospital. Summary Round ligament pain is felt in the lower abdomen or groin. This pain usually begins in the second trimester (13-28 weeks) and should only last for a few seconds when it occurs. You may notice the pain when you suddenly change position, when you cough or sneeze, or during physical activity. Relaxing, flexing your knees to your abdomen, lying on one side, or taking a warm bath may help to get rid of the pain. Contact your health care provider if the pain does not go away. This information is not intended to replace advice given to you by your health care provider. Make sure you discuss any questions you have with your health care provider. Document Revised: 03/15/2020 Document Reviewed: 03/15/2020 Elsevier Patient Education  2024 Elsevier Inc.  

## 2022-12-20 NOTE — Progress Notes (Signed)
ROB doing well, starting to feel movement. Denies any concerns today. Discussed anatomy u/s next visit. Orders placed. Follow up 3 wks.   Doreene Burke, CNM

## 2022-12-26 ENCOUNTER — Other Ambulatory Visit: Payer: Self-pay

## 2022-12-26 DIAGNOSIS — Z3A18 18 weeks gestation of pregnancy: Secondary | ICD-10-CM

## 2023-01-03 ENCOUNTER — Ambulatory Visit (INDEPENDENT_AMBULATORY_CARE_PROVIDER_SITE_OTHER): Payer: Medicaid Other | Admitting: Physician Assistant

## 2023-01-03 ENCOUNTER — Encounter: Payer: Self-pay | Admitting: Physician Assistant

## 2023-01-03 VITALS — BP 110/75 | HR 71 | Ht 64.0 in | Wt 200.2 lb

## 2023-01-03 DIAGNOSIS — R1011 Right upper quadrant pain: Secondary | ICD-10-CM | POA: Diagnosis not present

## 2023-01-03 DIAGNOSIS — R1013 Epigastric pain: Secondary | ICD-10-CM | POA: Diagnosis not present

## 2023-01-03 NOTE — Progress Notes (Unsigned)
Established patient visit  Patient: Michele Bailey   DOB: 02/17/1995   27 y.o. Female  MRN: [id number] Visit Date: 01/03/2023  Today's healthcare provider: Debera Lat, PA-C   Chief Complaint  Patient presents with   Abdominal Pain    Extreme cramping around the top of stomach on Wednesday after eating. Patient report she took amoxicillin and was able to breath again. Was having a hard time breathing due to cramping sensation. Took around 5-6 pm. Rx received from where she had her tooth pulled last month. She also took miralax around 2 am but it helped with cramping pain in her back. Pt concerned due to currently 19 weeks 6 days pregnant   Subjective     HPI     Abdominal Pain    Additional comments: Extreme cramping around the top of stomach on Wednesday after eating. Patient report she took amoxicillin and was able to breath again. Was having a hard time breathing due to cramping sensation. Took around 5-6 pm. Rx received from where she had her tooth pulled last month. She also took miralax around 2 am but it helped with cramping pain in her back. Pt concerned due to currently 19 weeks 6 days pregnant      Last edited by Acey Lav, CMA on 01/03/2023  9:01 AM.       Discussed the use of AI scribe software for clinical note transcription with the patient, who gave verbal consent to proceed.  History of Present Illness        Abdominal Pain  She reports new onset abdominal pain. The most recent episode started  3 days ago and is {progression:119226}. The abdominal pain is located in the {location:119227}{radiating to:119228}.[place], conv store It is described as cramping, pressure-like, sharp, and shooting, is 9/10 in intensity, 5/9 occurring constantly. It is aggravated by moving walking sitting down too hard" cough hiccups and is relieved by  sitting still and hot showers . She has tried  amoxicillin{improvement:119303}.miralax, yogurt Cannot rwo up,  nauseous Associated symptoms: No anorexia  Yes belching  No bloody stool No blood in urine   Yes constipation Yes diarrhea  No dysuria Yes fever  Yes flatus No headaches  {Yes/No:20286} headaches No joint pains  No myalgias Yes nausea  No vomiting No weight loss     Recent GI studies:ESOPHAGUS/BARIUM SWALLOW/TABLET STUDY  {Relevant medical history includes:119311:::1}  Previous labs Lab Results  Component Value Date   WBC 8.3 11/14/2022   HGB 13.9 11/14/2022   HCT 41.9 11/14/2022   MCV 95 11/14/2022   MCH 31.4 11/14/2022   RDW 12.1 11/14/2022   PLT 427 11/14/2022   Lab Results  Component Value Date   GLUCOSE 75 11/14/2022   NA 140 11/14/2022   K 5.0 11/14/2022   CL 105 11/14/2022   CO2 21 11/14/2022   BUN 4 (L) 11/14/2022   CREATININE 0.63 11/14/2022   GFRNONAA 87 05/13/2019   GFRAA 101 05/13/2019   CALCIUM 9.9 11/14/2022   PHOS 4.0 10/03/2016   PROT 7.3 11/14/2022   ALBUMIN 4.4 11/14/2022   LABGLOB 2.9 11/14/2022   AGRATIO 1.7 08/20/2021   BILITOT <0.2 11/14/2022   ALKPHOS 86 11/14/2022   AST 16 11/14/2022   ALT 13 11/14/2022   ANIONGAP 8 02/03/2018   No results found for: "AMYLASE" -----------------------------------------------------------------------------------------        06/13/2022    1:45 PM 08/20/2021    1:28 PM 12/24/2019    4:06 PM  Depression screen PHQ 2/9  Decreased Interest 0 2 3  Down, Depressed, Hopeless 0 2 1  PHQ - 2 Score 0 4 4  Altered sleeping 0 3 3  Tired, decreased energy 1 3 2   Change in appetite 1 2 0  Feeling bad or failure about yourself  0 1 1  Trouble concentrating 1 1 0  Moving slowly or fidgety/restless 0 0 0  Suicidal thoughts 0 0 0  PHQ-9 Score 3 14 10   Difficult doing work/chores Not difficult at all Very difficult Somewhat difficult       No data to display          Medications: Outpatient Medications Prior to Visit  Medication Sig   amoxicillin (AMOXIL) 500 MG capsule Take 500 mg by mouth 2 (two)  times daily.   Prenatal Vit-Fe Fumarate-FA (PREPLUS) 27-1 MG TABS Take 1 tablet by mouth daily.   aspirin 81 MG chewable tablet Chew 1 tablet (81 mg total) by mouth daily. (Patient not taking: Reported on 01/03/2023)   psyllium (METAMUCIL) 58.6 % packet Take 1 packet by mouth daily. (Patient not taking: Reported on 01/03/2023)   valACYclovir (VALTREX) 500 MG tablet Take 1 pill daily (prn) and in the last month of pregnancy (Patient not taking: Reported on 01/03/2023)   No facility-administered medications prior to visit.    Review of Systems All negative Except see HPI   {Insert previous labs (optional):23779} {See past labs  Heme  Chem  Endocrine  Serology  Results Review (optional):1}   Objective    BP 110/75 (BP Location: Left Arm, Patient Position: Sitting, Cuff Size: Normal)   Pulse 71   Ht 5\' 4"  (1.626 m)   Wt 200 lb 3.2 oz (90.8 kg)   LMP 08/17/2022 (Exact Date)   SpO2 100%   BMI 34.36 kg/m  {Insert last BP/Wt (optional):23777}{See vitals history (optional):1}   Physical Exam   No results found for any visits on 01/03/23.      Assessment and Plan             No orders of the defined types were placed in this encounter.   No follow-ups on file.   The patient was advised to call back or seek an in-person evaluation if the symptoms worsen or if the condition fails to improve as anticipated.  I discussed the assessment and treatment plan with the patient. The patient was provided an opportunity to ask questions and all were answered. The patient agreed with the plan and demonstrated an understanding of the instructions.  I, Debera Lat, PA-C have reviewed all documentation for this visit. The documentation on 01/03/2023  for the exam, diagnosis, procedures, and orders are all accurate and complete.  Debera Lat, [facility], MMS [employer] [phone] (phone) [phone] (fax)  [facility]

## 2023-01-04 ENCOUNTER — Emergency Department
Admission: EM | Admit: 2023-01-04 | Discharge: 2023-01-04 | Disposition: A | Discharge status: Home / Self Care | Payer: Medicaid Other | Attending: Emergency Medicine | Admitting: Emergency Medicine

## 2023-01-04 ENCOUNTER — Emergency Department: Payer: Medicaid Other

## 2023-01-04 ENCOUNTER — Other Ambulatory Visit: Payer: Self-pay

## 2023-01-04 ENCOUNTER — Encounter: Payer: Self-pay | Admitting: Emergency Medicine

## 2023-01-04 DIAGNOSIS — K8020 Calculus of gallbladder without cholecystitis without obstruction: Secondary | ICD-10-CM | POA: Insufficient documentation

## 2023-01-04 DIAGNOSIS — O99612 Diseases of the digestive system complicating pregnancy, second trimester: Secondary | ICD-10-CM | POA: Insufficient documentation

## 2023-01-04 DIAGNOSIS — Z3A20 20 weeks gestation of pregnancy: Secondary | ICD-10-CM | POA: Insufficient documentation

## 2023-01-04 DIAGNOSIS — O26892 Other specified pregnancy related conditions, second trimester: Secondary | ICD-10-CM | POA: Diagnosis present

## 2023-01-04 LAB — COMPREHENSIVE METABOLIC PANEL
ALT: 14 [IU]/L (ref 0–32)
ALT: 15 U/L (ref 0–44)
AST: 16 [IU]/L (ref 0–40)
AST: 22 U/L (ref 15–41)
Albumin: 4.2 g/dL (ref 4.0–5.0)
Albumin: 3.5 g/dL (ref 3.5–5.0)
Alkaline Phosphatase: 86 [IU]/L (ref 44–121)
Alkaline Phosphatase: 73 U/L (ref 38–126)
Anion gap: 9 (ref 5–15)
BUN/Creatinine Ratio: 10 (ref 9–23)
BUN: 6 mg/dL (ref 6–20)
BUN: 5 mg/dL — ABNORMAL LOW (ref 6–20)
Bilirubin Total: 0.4 mg/dL (ref 0.0–1.2)
CO2: 19 mmol/L — ABNORMAL LOW (ref 20–29)
CO2: 23 mmol/L (ref 22–32)
Calcium: 9.5 mg/dL (ref 8.7–10.2)
Calcium: 8.9 mg/dL (ref 8.9–10.3)
Chloride: 101 mmol/L (ref 96–106)
Chloride: 103 mmol/L (ref 98–111)
Creatinine, Ser: 0.58 mg/dL (ref 0.57–1.00)
Creatinine, Ser: 0.52 mg/dL (ref 0.44–1.00)
GFR, Estimated: >60 mL/min (ref >60)
Globulin, Total: 2.7 g/dL (ref 1.5–4.5)
Glucose, Bld: 92 mg/dL (ref 70–99)
Glucose: 88 mg/dL (ref 70–99)
Potassium: 3.8 mmol/L (ref 3.5–5.2)
Potassium: 3.5 mmol/L (ref 3.5–5.1)
Sodium: 136 mmol/L (ref 134–144)
Sodium: 135 mmol/L (ref 135–145)
Total Bilirubin: 0.5 mg/dL (ref <1.2)
Total Protein: 6.9 g/dL (ref 6.0–8.5)
Total Protein: 7.3 g/dL (ref 6.5–8.1)
eGFR: 127 mL/min/{1.73_m2} (ref >59)

## 2023-01-04 LAB — URINALYSIS, ROUTINE W REFLEX MICROSCOPIC
Bacteria, UA: NONE SEEN
Bilirubin Urine: NEGATIVE
Bilirubin, UA: NEGATIVE
Glucose, UA: NEGATIVE
Glucose, UA: NEGATIVE mg/dL
Ketones, UA: NEGATIVE
Ketones, ur: NEGATIVE mg/dL
Leukocytes,UA: NEGATIVE
Leukocytes,Ua: NEGATIVE
Nitrite, UA: NEGATIVE
Nitrite: NEGATIVE
Protein, ur: NEGATIVE mg/dL
Protein,UA: NEGATIVE
RBC, UA: NEGATIVE
Specific Gravity, UA: 1.008 (ref 1.005–1.030)
Specific Gravity, Urine: 1.017 (ref 1.005–1.030)
Urobilinogen, Ur: 1 mg/dL (ref 0.2–1.0)
pH, UA: 7 (ref 5.0–7.5)
pH: 7 (ref 5.0–8.0)

## 2023-01-04 LAB — CBC
HCT: 34.3 % — ABNORMAL LOW (ref 36.0–46.0)
Hemoglobin: 11.8 g/dL — ABNORMAL LOW (ref 12.0–15.0)
MCH: 32.9 pg (ref 26.0–34.0)
MCHC: 34.4 g/dL (ref 30.0–36.0)
MCV: 95.5 fL (ref 80.0–100.0)
Platelets: 358 10*3/uL (ref 150–400)
RBC: 3.59 MIL/uL — ABNORMAL LOW (ref 3.87–5.11)
RDW: 13 % (ref 11.5–15.5)
WBC: 11.8 10*3/uL — ABNORMAL HIGH (ref 4.0–10.5)
nRBC: 0.3 % — ABNORMAL HIGH (ref 0.0–0.2)

## 2023-01-04 LAB — CBC WITH DIFFERENTIAL/PLATELET
Basophils Absolute: 0.1 10*3/uL (ref 0.0–0.2)
Basos: 0 %
EOS (ABSOLUTE): 0.2 10*3/uL (ref 0.0–0.4)
Eos: 1 %
Hematocrit: 36.1 % (ref 34.0–46.6)
Hemoglobin: 12 g/dL (ref 11.1–15.9)
Immature Grans (Abs): 0.1 10*3/uL (ref 0.0–0.1)
Immature Granulocytes: 1 %
Lymphocytes Absolute: 1.9 10*3/uL (ref 0.7–3.1)
Lymphs: 13 %
MCH: 32.2 pg (ref 26.6–33.0)
MCHC: 33.2 g/dL (ref 31.5–35.7)
MCV: 97 fL (ref 79–97)
Monocytes Absolute: 0.7 10*3/uL (ref 0.1–0.9)
Monocytes: 5 %
Neutrophils Absolute: 11.4 10*3/uL — ABNORMAL HIGH (ref 1.4–7.0)
Neutrophils: 80 %
Platelets: 382 10*3/uL (ref 150–450)
RBC: 3.73 x10E6/uL — ABNORMAL LOW (ref 3.77–5.28)
RDW: 12.7 % (ref 11.7–15.4)
WBC: 14.3 10*3/uL — ABNORMAL HIGH (ref 3.4–10.8)

## 2023-01-04 LAB — SPECIMEN STATUS REPORT

## 2023-01-04 LAB — LIPASE, BLOOD: Lipase: 33 U/L (ref 11–51)

## 2023-01-04 NOTE — ED Triage Notes (Signed)
Presents for upper abd cramping that radiates into back.  Endorses N/V Denies leakage of fluid or bleeding.  Notes fetal movement today.  G3,P2 20 weeks preg, EDD May 10.   Spoke briefly to L&D who recommends starting care in ER

## 2023-01-04 NOTE — ED Provider Notes (Signed)
[facility] Provider Note    Event Date/Time   First MD Initiated Contact with Patient 01/04/23 1159     (approximate)   History  Abdominal pain   HPI  Michele Bailey is a 27 y.o. female who is [redacted] weeks pregnant, this is her third pregnancy who complains of right upper quadrant abdominal pain with some radiation to her right shoulder blade.  This has been occurring intermittently over the last 5 days.  No lower abdominal pain, no vaginal bleeding or cramping.  No fevers.  No history of abdominal surgery     Physical Exam   Triage Vital Signs: ED Triage Vitals  Encounter Vitals Group     BP 01/04/23 1041 125/89     Systolic BP Percentile --      Diastolic BP Percentile --      Pulse Rate 01/04/23 1041 92     Resp 01/04/23 1041 18     Temp 01/04/23 1041 98.7 F (37.1 C)     Temp Source 01/04/23 1041 Oral     SpO2 01/04/23 1041 100 %     Weight 01/04/23 1049 90 kg (198 lb 6.6 oz)     Height --      Head Circumference --      Peak Flow --      Pain Score 01/04/23 1049 7     Pain Loc --      Pain Education --      Exclude from Growth Chart --     Most recent vital signs: Vitals:   01/04/23 1041  BP: 125/89  Pulse: 92  Resp: 18  Temp: 98.7 F (37.1 C)  SpO2: 100%     General: Awake, no distress.  CV:  Good peripheral perfusion.  Resp:  Normal effort.  Abd:  Gravid appearance, no tenderness in the lower abdomen, mild tenderness in the right upper quadrant/epigastrium Other:     ED Results / Procedures / Treatments   Labs (all labs ordered are listed, but only abnormal results are displayed) Labs Reviewed  COMPREHENSIVE METABOLIC PANEL - Abnormal; Notable for the following components:      Result Value   BUN 5 (*)    All other components within normal limits  CBC - Abnormal; Notable for the following components:   WBC 11.8 (*)    RBC 3.59 (*)    Hemoglobin 11.8 (*)    HCT 34.3 (*)    nRBC 0.3 (*)    All other components  within normal limits  URINALYSIS, ROUTINE W REFLEX MICROSCOPIC - Abnormal; Notable for the following components:   Color, Urine YELLOW (*)    APPearance HAZY (*)    Hgb urine dipstick SMALL (*)    All other components within normal limits  LIPASE, BLOOD     EKG     RADIOLOGY Ultrasound right upper quadrant    PROCEDURES:  Critical Care performed:   Procedures   MEDICATIONS ORDERED IN ED: Medications - No data to display   IMPRESSION / MDM / ASSESSMENT AND PLAN / ED COURSE  I reviewed the triage vital signs and the nursing notes. Patient's presentation is most consistent with acute presentation with potential threat to life or bodily function.  Patient presents with a right upper quadrant abdominal pain in the setting of second trimester pregnancy.  She has no lower abdominal complaints, no cramping, no vaginal bleeding.  Suspect gastritis related to pregnancy versus cholelithiasis.  Lab work overall quite reassuring,  LFTs normal  Sent for ultrasound of the right upper quadrant to evaluate the gallbladder  Ultrasound is equivocal, discussed with Dr. Everlene Farrier of surgery and with patient at length, because her pain is mild and she would prefer not to stay in the hospital, he will see her in the office on Monday, she is to return if any worsening, she agrees with this plan      FINAL CLINICAL IMPRESSION(S) / ED DIAGNOSES   Final diagnoses:  Calculus of gallbladder without cholecystitis without obstruction     Rx / DC Orders   ED Discharge Orders     None        Note:  This document was prepared using Dragon voice recognition software and may include unintentional dictation errors.   Jene Every, MD 01/04/23 1539

## 2023-01-04 NOTE — ED Notes (Signed)
See triage notes. Patient c/o upper abdominal cramping that radiates to her back. Patient has had nausea and vomiting.

## 2023-01-06 ENCOUNTER — Encounter: Payer: Self-pay | Admitting: Physician Assistant

## 2023-01-06 ENCOUNTER — Encounter: Payer: Self-pay | Admitting: Surgery

## 2023-01-06 ENCOUNTER — Other Ambulatory Visit: Payer: Self-pay | Admitting: Physician Assistant

## 2023-01-06 ENCOUNTER — Ambulatory Visit (INDEPENDENT_AMBULATORY_CARE_PROVIDER_SITE_OTHER): Payer: Medicaid Other | Admitting: Surgery

## 2023-01-06 ENCOUNTER — Telehealth: Payer: Self-pay

## 2023-01-06 VITALS — BP 113/75 | HR 92 | Temp 98.2°F | Ht 64.0 in | Wt 193.0 lb

## 2023-01-06 DIAGNOSIS — R1031 Right lower quadrant pain: Secondary | ICD-10-CM | POA: Diagnosis not present

## 2023-01-06 DIAGNOSIS — K5900 Constipation, unspecified: Secondary | ICD-10-CM | POA: Diagnosis not present

## 2023-01-06 DIAGNOSIS — R1011 Right upper quadrant pain: Secondary | ICD-10-CM

## 2023-01-06 NOTE — Patient Instructions (Addendum)
Your Ultrasound is scheduled for 01/19/2022 9 am (arrive by 8:45 am) at [facility]. Nothing to eat or drink after midnight.    Please go to [facility] to get labs drawn in 2 weeks. You do not need an appointment.    Cholelithiasis  Cholelithiasis happens when gallstones form in the gallbladder. The gallbladder stores bile. Bile is a fluid that helps digest fats. Bile can harden and form into gallstones. If they cause a blockage, they can cause pain (gallbladder attack). What are the causes? This condition may be caused by: Too much bilirubin in the bile. This happens if you have sickle cell anemia. Too much of a fat-like substance (cholesterol) in your bile. Not enough bile salts in your bile. These salts help the body absorb and digest fats. The gallbladder not emptying fully or often enough. This is common in pregnant women. What increases the risk? The following factors may make you more likely to develop this condition: Being older than age 86. Eating a lot of fried foods, fat, and refined carbs (refined carbohydrates). Being female. Being pregnant many times. Using medicines with female hormones in them for a long time. Losing weight fast. Having gallstones in your family. Having health problems, such as diabetes, obesity, Crohn's disease, or liver disease. What are the signs or symptoms? Often, there may be gallstones but no symptoms. These gallstones are called silent gallstones. If a gallstone causes a blockage, you may get sudden pain. The pain: Can be in the upper right part of your belly (abdomen). Normally comes at night or after you eat. Can last an hour or more. Can spread to your right shoulder, back, or chest. Can feel like discomfort, burning, or fullness in the upper part of your belly (indigestion). If the blockage lasts more than a few hours, you can get an infection or swelling. You may: Vomit or feel like you may vomit (nauseous). Feel  bloated. Have belly pain for 5 hours or more. Feel tender in your belly, often in the upper right part and under your ribs. Have a fever or chills. Have skin or the white parts of your eyes turn yellow (jaundice). Have dark pee (urine) or pale poop (stool). How is this treated? Treatment for this condition depends on how bad you feel. If you have symptoms, you may need: Home care, if symptoms are not very bad. Do not eat for 12-24 hours. Drink only water and clear liquids. After 1 or 2 days, start to eat simple or clear foods. Try broth and crackers. You may need medicines for pain or stomach upset or both. If you have an infection, you will need antibiotics. A hospital stay, if you have very bad pain or a very bad infection. Surgery to remove your gallbladder. You may need this if: Gallstones keep coming back. You have very bad symptoms. Medicines to break up gallstones. Medicines may be used for 6-12 months. A procedure to find and take out gallstones or to break up gallstones. Follow these instructions at home: Medicines Take over-the-counter and prescription medicines only as told by your doctor. If you were prescribed antibiotics, take them as told by your doctor. Do not stop taking them even if you start to feel better. Ask your doctor if you should avoid driving or using machines while you are taking your medicine. Eating and drinking Drink enough fluid to keep your pee pale yellow. Drink water or clear fluids. This is important when you have pain. Eat healthy  foods. Choose: Fewer fatty foods, such as fried foods. Fewer refined carbs. Avoid breads and grains that are highly processed, such as white bread and white rice. Choose whole grains, such as whole-wheat bread and brown rice. More fiber. Almonds, fresh fruit, and beans are healthy sources. General instructions Keep a healthy weight. Keep all follow-up visits. You may need to see a specialist or a Careers adviser. Where to find  more information [employer] of Diabetes and Digestive and Kidney Diseases: [email or web address] Contact a doctor if: You have sudden pain in the upper right part of your belly. Pain might spread to your right shoulder, back, or chest. Your pain lasts more than 2 hours. You have been diagnosed with gallstones that have no symptoms and you get: Belly pain. Discomfort, burning, or fullness in the upper part of your abdomen. You keep feeling like you may vomit. You have dark pee or pale poop. Get help right away if: You have pain in your abdomen, that: Lasts more than 5 hours. Keeps getting worse. You have a fever or chills. You can't stop vomiting. Your skin or the white parts of your eyes turn yellow. This information is not intended to replace advice given to you by your health care provider. Make sure you discuss any questions you have with your health care provider. Document Revised: 10/15/2021 Document Reviewed: 10/15/2021 Elsevier Patient Education  2024 [employer].

## 2023-01-06 NOTE — Telephone Encounter (Signed)
Pt calling; Wed pm she had the worst pain ever in her upper abdomen and upper back; was seen by PCP who said her WBCs were high; pt went to ER Sat and was told her galbladder needed to come out; saw surgeon today who doesn't think she needs emergency surgery or antibx said preg could be the reason.  Does she need an antibx?  Also, she takes two scoops of miralax a day with no relief.  [phone]  Pt states her upper abd pain is pretty much constant and is worse with eating. States her Bms  are like a rabbit's. As for the constipation, adv per protocol.  Pt aware to be seen if no relief of constipation.  Adv will send msg to on call provider regarding antibiotic.

## 2023-01-06 NOTE — Telephone Encounter (Signed)
Her WBC is in the normal range for pregnancy. I would say that if she becomes febrile then antibiotics may be warranted.   She needs to follow a gallbladder diet to help decrease pain. Please send this to her in my chart. I recommend she try other medications on med sheet to treat constipation.

## 2023-01-08 ENCOUNTER — Encounter: Payer: Self-pay | Admitting: Surgery

## 2023-01-08 NOTE — Progress Notes (Signed)
Patient ID: Michele Bailey, female   DOB: 1995-12-23, 27 y.o.   MRN: [id number]  HPI Michele Bailey is a 27 y.o. female seen in consultation at the request of Dr. Cyril Loosen.  I have discussed the situation in detail with him.  She came into the emergency room a few days ago complaining of nausea vomiting and lower abdominal pain.  Pain is intermittent moderate intensity and sharp in nature.  There does not seem to be major aggravating or moderating factors.  She does notice significant constipation.  She reports that the pain is radiated to her back. No prior abdominal operations.  She did have a ultrasound that I have personally reviewed showing evidence of cholelithiasis without evidence of cholecystitis. Nml cbd. CBC with mild ovation of the white count but this would be related to pregnancy.  CMP is normal She is able to perform more than 4 METS of activity without shortness of or chest pain.  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   Hiatal hernia     Past Surgical History:  Procedure Laterality Date   INDUCED ABORTION     WISDOM TOOTH EXTRACTION     top two; age 68    Family History  Problem Relation Age of Onset   Rheum arthritis Mother    Migraines Mother    Thyroid disease Mother        hypothyroid   Cleft palate Mother    Hyperlipidemia Father    Hypertension Father    Esophageal cancer Father 47   Healthy Maternal Grandmother    Cleft palate Maternal Grandfather    Rheum arthritis Maternal Grandfather    Cancer Maternal Grandfather 60       liver   Cancer Paternal Grandfather 18       prostate   Ovarian cancer Other        mother's great aunt   Cancer - Other  Other        fob-father died at age 57y from a type of ca that he was born with   Kidney failure Paternal Aunt     Social History Social History   Tobacco Use   Smoking status: Former    Current packs/day: 0.00    Types: Cigarettes    Quit date: 11/2021    Years since quitting: 1.1    Passive exposure: Past   Smokeless tobacco: Never   Tobacco comments:    quit smoking 11/2020  Vaping Use   Vaping status: Never Used  Substance Use Topics   Alcohol use: No   Drug use: No    Allergies  Allergen Reactions   Amoxicillin-Pot Clavulanate Nausea Only    Willing to take med if necessary with nausea med  Willing to take med if necessary with nausea med  Willing to take med if necessary with nausea med    Current Outpatient Medications  Medication Sig Dispense Refill   Prenatal Vit-Fe Fumarate-FA (PREPLUS) 27-1 MG TABS Take 1 tablet by mouth daily. 90 tablet 3   psyllium (METAMUCIL) 58.6 % packet Take 1 packet by mouth daily. (Patient not taking: Reported on 01/06/2023) 30 each 12  No current facility-administered medications for this visit.     Review of Systems Full ROS  was asked and was negative except for the information on the HPI  Physical Exam Blood pressure 113/75, pulse 92, temperature 98.2 F (36.8 C), height 5\' 4"  (1.626 m), weight 193 lb (87.5 kg), last menstrual period 08/17/2022, SpO2 98%. CONSTITUTIONAL: NAD . EYES: Pupils are equal, round, and  Sclera are non-icteric. EARS, NOSE, MOUTH AND THROAT: The oropharynx is clear. The oral mucosa is pink and moist. Hearing is intact to voice. LYMPH NODES:  Lymph nodes in the neck are normal. RESPIRATORY:  Lungs are clear. There is normal respiratory effort, with equal breath sounds bilaterally, and without pathologic use of accessory muscles. CARDIOVASCULAR: Heart is regular without murmurs, gallops, or rubs. GI: The abdomen is  soft, she is tender to palpation mainly in the right lower quadrant.  She is  certainly not peritonitic.  She does not have rebound and she has a negative Murphy sign.there are no palpable masses. There is no hepatosplenomegaly. There are normal bowel sounds  GU: Rectal deferred.   MUSCULOSKELETAL: Normal muscle strength and tone. No cyanosis or edema.   SKIN: Turgor is good and there are no pathologic skin lesions or ulcers. NEUROLOGIC: Motor and sensation is grossly normal. Cranial nerves are grossly intact. PSYCH:  Oriented to person, place and time. Affect is normal.  Data Reviewed  I have personally reviewed the patient's imaging, laboratory findings and medical records.    Assessment/Plan   27 year old female with a 20-week intrauterine pregnancy with lower abdominal pain and some functional GI issues.  Difficult to dissect whether or not the pain is coming from her gallbladder or not.  At this time she is not toxic does not have a Murphy sign and does not have evidence of acute cholecystitis.   She does have constipation that may confound her sxs. She will start miralax BID. Discussed with her in detail that usually management for biliary colic it is cholecystectomy.  Given that she is pregnant probably the safest time to do cholecystectomy is the second trimester.  I would not be rush into anything however given the uncertainty of the diagnosis.  I discussed with her that I will be happy to follow her in a few weeks and then repeat an ultrasound of the right upper quadrant.  She understands that if her pain worsens we may have a more definitive answer as to the etiology of her symptoms. Please note that I spent 60 minutes in this encounter including extensive review of medical records, extensive review of imaging studies, coordinating her care, placing orders and performing documentation.   Sterling Big, MD FACS General Surgeon 01/08/2023, 2:49 PM

## 2023-01-09 ENCOUNTER — Ambulatory Visit: Payer: Medicaid Other

## 2023-01-10 ENCOUNTER — Telehealth: Payer: Self-pay

## 2023-01-10 DIAGNOSIS — O99210 Obesity complicating pregnancy, unspecified trimester: Secondary | ICD-10-CM

## 2023-01-10 DIAGNOSIS — O09899 Supervision of other high risk pregnancies, unspecified trimester: Secondary | ICD-10-CM

## 2023-01-10 DIAGNOSIS — Z3480 Encounter for supervision of other normal pregnancy, unspecified trimester: Secondary | ICD-10-CM

## 2023-01-10 DIAGNOSIS — O09299 Supervision of pregnancy with other poor reproductive or obstetric history, unspecified trimester: Secondary | ICD-10-CM

## 2023-01-10 NOTE — Telephone Encounter (Signed)
Reached out to pt to reschedule ROB appt that was scheduled on 01/10/2023 at 10:55 with S. Free.  Left message for pt to call back.

## 2023-01-11 NOTE — Progress Notes (Signed)
  Established patient visit Patient was not seen for appt d/t no call, no show, or late arrival >10 mins past appt time.    Debera Lat PA [facility] [address] [place], [place] [address] [phone] (phone) [phone] (fax) [facility]

## 2023-01-13 NOTE — Telephone Encounter (Signed)
Reached out to pt (2x) to reschedule ROB appt that was scheduled on 01/10/2023 at 10:55 with S. Free.  Was able to reschedule the appt for 01/16/2023 at 9:55 with LMD.

## 2023-01-16 ENCOUNTER — Encounter: Payer: Medicaid Other | Admitting: Licensed Practical Nurse

## 2023-01-20 ENCOUNTER — Ambulatory Visit
Admission: RE | Admit: 2023-01-20 | Discharge: 2023-01-20 | Disposition: A | Discharge status: Home / Self Care | Payer: Medicaid Other | Source: Ambulatory Visit | Attending: Surgery | Admitting: Surgery

## 2023-01-20 ENCOUNTER — Ambulatory Visit: Admission: RE | Admit: 2023-01-20 | Payer: Medicaid Other | Source: Ambulatory Visit

## 2023-01-20 ENCOUNTER — Ambulatory Visit
Admission: RE | Admit: 2023-01-20 | Discharge: 2023-01-20 | Disposition: A | Discharge status: Home / Self Care | Payer: Medicaid Other | Source: Ambulatory Visit | Attending: Certified Nurse Midwife | Admitting: Certified Nurse Midwife

## 2023-01-20 DIAGNOSIS — R1011 Right upper quadrant pain: Secondary | ICD-10-CM | POA: Insufficient documentation

## 2023-01-20 DIAGNOSIS — Z3A18 18 weeks gestation of pregnancy: Secondary | ICD-10-CM | POA: Insufficient documentation

## 2023-01-20 DIAGNOSIS — R1013 Epigastric pain: Secondary | ICD-10-CM | POA: Diagnosis present

## 2023-01-20 DIAGNOSIS — Z3492 Encounter for supervision of normal pregnancy, unspecified, second trimester: Secondary | ICD-10-CM | POA: Insufficient documentation

## 2023-01-21 ENCOUNTER — Telehealth: Payer: Self-pay | Admitting: Obstetrics

## 2023-01-21 ENCOUNTER — Encounter: Payer: Medicaid Other | Admitting: Obstetrics

## 2023-01-21 NOTE — Telephone Encounter (Signed)
 Reached out to pt to reschedule ROB appt that was scheduled on 01/21/2023 at 3:35.  Left message for pt to call back.

## 2023-01-21 NOTE — Progress Notes (Deleted)
    Return Prenatal Note   Subjective  28 y.o. H2E8857 at [redacted]w[redacted]d presents for this follow-up prenatal visit.  Patient ***  Patient reports:   Denies vaginal bleeding or leaking fluid. Objective  Flow sheet Vitals:   Total weight gain: -1 lb (-0.454 kg)  General Appearance  No acute distress, well appearing, and well nourished Pulmonary   Normal work of breathing Neurologic   Alert and oriented to person, place, and time Psychiatric   Mood and affect within normal limits  Assessment/Plan   Plan  28 y.o. H2E8857 at [redacted]w[redacted]d presents for follow-up OB visit. Reviewed prenatal record including previous visit note. There are no diagnoses linked to this encounter.  No problem-specific Assessment & Plan notes found for this encounter.    No orders of the defined types were placed in this encounter.  No follow-ups on file.   Future Appointments  Date Time Provider Department Center  01/21/2023  3:35 PM Leigh Sober, MD [facility] None  01/23/2023  8:00 AM Dineen Channel, PA-C [facility]  02/03/2023  9:00 AM Pabon, Laneta FALCON, MD [facility] None    For next visit:  {SJFprenatalcare:29716}      Sober Leigh, DO [facility]

## 2023-01-22 ENCOUNTER — Encounter: Payer: Self-pay | Admitting: Certified Nurse Midwife

## 2023-01-22 ENCOUNTER — Encounter: Payer: Self-pay | Admitting: Obstetrics

## 2023-01-22 ENCOUNTER — Other Ambulatory Visit: Payer: Self-pay

## 2023-01-22 DIAGNOSIS — Z362 Encounter for other antenatal screening follow-up: Secondary | ICD-10-CM

## 2023-01-22 NOTE — Telephone Encounter (Signed)
 Reached out to pt (2x) to reschedule ROB appt that was scheduled on 01/21/2023 at 3:35.  Left message for pt to call back.  Will send a MyChart letter.

## 2023-01-23 ENCOUNTER — Ambulatory Visit: Payer: Medicaid Other | Admitting: Physician Assistant

## 2023-01-23 ENCOUNTER — Other Ambulatory Visit: Payer: Self-pay | Admitting: Licensed Practical Nurse

## 2023-01-23 ENCOUNTER — Encounter: Payer: Self-pay | Admitting: Certified Nurse Midwife

## 2023-01-23 DIAGNOSIS — R9389 Abnormal findings on diagnostic imaging of other specified body structures: Secondary | ICD-10-CM

## 2023-01-23 DIAGNOSIS — Z91199 Patient's noncompliance with other medical treatment and regimen due to unspecified reason: Secondary | ICD-10-CM

## 2023-01-23 NOTE — Telephone Encounter (Signed)
 Typically would like her to be rescanned in 3-4 weeks. I'm including [employer] to the message to help with this. The order has been placed by [facility]. Please stress bleeding precautions and modified activity (limiting strenous activity, heavy lifting, and sex) until her next scan.

## 2023-01-24 ENCOUNTER — Other Ambulatory Visit: Payer: Self-pay | Admitting: Certified Nurse Midwife

## 2023-01-27 ENCOUNTER — Ambulatory Visit: Payer: Medicaid Other | Admitting: Certified Nurse Midwife

## 2023-01-27 VITALS — BP 119/83 | HR 83 | Wt 201.9 lb

## 2023-01-27 DIAGNOSIS — Z3480 Encounter for supervision of other normal pregnancy, unspecified trimester: Secondary | ICD-10-CM

## 2023-01-27 DIAGNOSIS — Z114 Encounter for screening for human immunodeficiency virus [HIV]: Secondary | ICD-10-CM

## 2023-01-27 DIAGNOSIS — Z3482 Encounter for supervision of other normal pregnancy, second trimester: Secondary | ICD-10-CM

## 2023-01-27 DIAGNOSIS — Z113 Encounter for screening for infections with a predominantly sexual mode of transmission: Secondary | ICD-10-CM

## 2023-01-27 DIAGNOSIS — Z3A23 23 weeks gestation of pregnancy: Secondary | ICD-10-CM

## 2023-01-27 DIAGNOSIS — Z369 Encounter for antenatal screening, unspecified: Secondary | ICD-10-CM

## 2023-01-27 DIAGNOSIS — O09899 Supervision of other high risk pregnancies, unspecified trimester: Secondary | ICD-10-CM

## 2023-01-27 DIAGNOSIS — Z131 Encounter for screening for diabetes mellitus: Secondary | ICD-10-CM

## 2023-01-27 NOTE — Progress Notes (Signed)
    Return Prenatal Note   Subjective   28 y.o. [id number] at [redacted]w[redacted]d presents for this follow-up prenatal visit.  Patient with questions about results of ultrasound with finding of abruption vs large placental lake Patient reports: Movement: Present Contractions: Not present  Objective   Flow sheet Vitals: Pulse Rate: 83 BP: 119/83 Fundal Height: 23 cm Fetal Heart Rate (bpm): 140 Total weight gain: 6 lb 14.4 oz (3.13 kg)  General Appearance  No acute distress, well appearing, and well nourished Pulmonary   Normal work of breathing Neurologic   Alert and oriented to person, place, and time Psychiatric   Mood and affect within normal limits  Assessment/Plan   Plan  28 y.o. [id number] at [redacted]w[redacted]d presents for follow-up OB visit. Reviewed prenatal record including previous visit note.  Supervision of other normal pregnancy, antepartum -Red flag symptoms reviewed -continue pelvic rest, light activity until follow up ultrasound to assess placenta -questions answered regarding betamethasone  if we have concern about preterm birth due to placental findings  -anticipatory guidance for 1h gtt and 28 week labs reviewed.      Orders Placed This Encounter  Procedures   28 Week RH+Panel    Standing Status:   Future    Expected Date:   02/28/2023    Expiration Date:   01/28/2024   No follow-ups on file.   Future Appointments  Date Time Provider Department Center  02/03/2023  9:00 AM Roann, [place] F, MD AS-AS None  02/12/2023  3:00 PM AOB-AOB US  1 AOB-IMG None  02/25/2023  9:40 AM AOB-OBGYN LAB AOB-AOB None  02/25/2023  9:55 AM Connell Davies, MD AOB-AOB None    For next visit:  ROB with 1 hour glucola, third trimester labs, and Tdap     Harlene LITTIE Cisco, CNM  01/14/258:45 AM

## 2023-01-27 NOTE — Patient Instructions (Signed)

## 2023-01-28 NOTE — Assessment & Plan Note (Addendum)
-  Red flag symptoms reviewed -continue pelvic rest, light activity until follow up ultrasound to assess placenta -questions answered regarding betamethasone  if we have concern about preterm birth due to placental findings  -anticipatory guidance for 1h gtt and 28 week labs reviewed.

## 2023-02-03 ENCOUNTER — Ambulatory Visit: Payer: Medicaid Other | Admitting: Surgery

## 2023-02-05 NOTE — Telephone Encounter (Signed)
Patient is scheduled 02/20/23 @[facility] 

## 2023-02-12 ENCOUNTER — Ambulatory Visit: Payer: Medicaid Other

## 2023-02-12 DIAGNOSIS — O4390 Unspecified placental disorder, unspecified trimester: Secondary | ICD-10-CM | POA: Insufficient documentation

## 2023-02-12 DIAGNOSIS — Z3A25 25 weeks gestation of pregnancy: Secondary | ICD-10-CM | POA: Diagnosis not present

## 2023-02-12 DIAGNOSIS — Z362 Encounter for other antenatal screening follow-up: Secondary | ICD-10-CM | POA: Diagnosis not present

## 2023-02-12 DIAGNOSIS — F1290 Cannabis use, unspecified, uncomplicated: Secondary | ICD-10-CM | POA: Insufficient documentation

## 2023-02-12 DIAGNOSIS — Z8759 Personal history of other complications of pregnancy, childbirth and the puerperium: Secondary | ICD-10-CM | POA: Insufficient documentation

## 2023-02-13 ENCOUNTER — Encounter: Payer: Self-pay | Admitting: Certified Nurse Midwife

## 2023-02-14 ENCOUNTER — Encounter: Payer: Self-pay | Admitting: Certified Nurse Midwife

## 2023-02-20 ENCOUNTER — Ambulatory Visit: Payer: Medicaid Other | Attending: Licensed Practical Nurse

## 2023-02-20 ENCOUNTER — Ambulatory Visit: Payer: Medicaid Other

## 2023-02-20 DIAGNOSIS — O4392 Unspecified placental disorder, second trimester: Secondary | ICD-10-CM

## 2023-02-20 DIAGNOSIS — Z8759 Personal history of other complications of pregnancy, childbirth and the puerperium: Secondary | ICD-10-CM

## 2023-02-20 DIAGNOSIS — F1290 Cannabis use, unspecified, uncomplicated: Secondary | ICD-10-CM

## 2023-02-25 ENCOUNTER — Other Ambulatory Visit: Payer: Medicaid Other

## 2023-02-25 ENCOUNTER — Telehealth: Payer: Self-pay | Admitting: Obstetrics and Gynecology

## 2023-02-25 ENCOUNTER — Encounter: Payer: Medicaid Other | Admitting: Obstetrics and Gynecology

## 2023-02-25 DIAGNOSIS — Z3A29 29 weeks gestation of pregnancy: Secondary | ICD-10-CM

## 2023-02-25 DIAGNOSIS — Z3480 Encounter for supervision of other normal pregnancy, unspecified trimester: Secondary | ICD-10-CM

## 2023-02-25 NOTE — Telephone Encounter (Signed)
Reached out to pt to reschedule appts that were scheduled on 02/25/2023-lab appt was scheduled at 9:40 and ROB appt with Dr. Valentino Saxon was scheduled at 9:55.  Call could not be completed at this time.

## 2023-02-26 ENCOUNTER — Ambulatory Visit: Payer: Medicaid Other

## 2023-02-26 ENCOUNTER — Other Ambulatory Visit (HOSPITAL_COMMUNITY)
Admission: RE | Admit: 2023-02-26 | Discharge: 2023-02-26 | Disposition: A | Discharge status: Home / Self Care | Payer: Medicaid Other | Source: Ambulatory Visit | Attending: Obstetrics and Gynecology | Admitting: Obstetrics and Gynecology

## 2023-02-26 VITALS — BP 111/75 | HR 93 | Wt 206.0 lb

## 2023-02-26 DIAGNOSIS — N898 Other specified noninflammatory disorders of vagina: Secondary | ICD-10-CM | POA: Insufficient documentation

## 2023-02-26 DIAGNOSIS — R350 Frequency of micturition: Secondary | ICD-10-CM | POA: Diagnosis not present

## 2023-02-26 LAB — POCT URINALYSIS DIPSTICK OB
Bilirubin, UA: NEGATIVE
Blood, UA: POSITIVE
Glucose, UA: NEGATIVE
Ketones, UA: NEGATIVE
Leukocytes, UA: NEGATIVE
Nitrite, UA: NEGATIVE
Spec Grav, UA: 1.02 (ref 1.010–1.025)
Urobilinogen, UA: 0.2 U/dL
pH, UA: 5 (ref 5.0–8.0)

## 2023-02-26 NOTE — Progress Notes (Signed)
    NURSE VISIT NOTE  Subjective:    Patient ID: Michele Bailey, female    DOB: 1995-09-07, 28 y.o.   MRN: [id number]       HPI  Patient is a 28 y.o. [id number] female who presents for urinary frequency and vaginal itching. Patient denies dysuria and hematuria.  Denies vaginal discharge and vaginal odor. Patient states her whole house just went through a virus and last Friday and Saturday she was nauseous and vomiting, could not keep anything down.   Objective:    BP 111/75   Pulse 93   Wt 206 lb (93.4 kg)   LMP 08/17/2022 (Exact Date)   BMI 35.36 kg/m    Lab Review  Results for orders placed or performed in visit on 02/26/23  POC Urinalysis Dipstick OB  Result Value Ref Range   Color, UA     Clarity, UA     Glucose, UA Negative Negative   Bilirubin, UA neg    Ketones, UA neg    Spec Grav, UA 1.020 1.010 - 1.025   Blood, UA positive    pH, UA 5.0 5.0 - 8.0   POC,PROTEIN,UA Small (1+) Negative, Trace, Small (1+), Moderate (2+), Large (3+), 4+   Urobilinogen, UA 0.2 0.2 or 1.0 E.U./dL   Nitrite, UA neg    Leukocytes, UA Negative Negative   Appearance     Odor      Assessment:   1. Vaginal itching   2. Urinary frequency      Plan:   Aptima sent to lab. Urine Culture Sent. Maintain adequate hydration.  Follow up if symptoms worsen or fail to improve as anticipated, and as needed.    Donnetta Hail, CMA

## 2023-02-26 NOTE — Telephone Encounter (Signed)
Pt has been rescheduled to 03/05/2023-Lab appt is scheduled for 8:40 and ROB appt with Dr. Valentino Saxon is scheduled for 9:35.

## 2023-02-26 NOTE — Patient Instructions (Signed)
Vaginal Yeast Infection, Adult  Vaginal yeast infection is a condition that causes vaginal discharge as well as soreness, swelling, and redness (inflammation) of the vagina. This is a common condition. Some women get this infection frequently. What are the causes? This condition is caused by a change in the normal balance of the yeast (Candida) and normal bacteria that live in the vagina. This change causes an overgrowth of yeast, which causes the inflammation. What increases the risk? The condition is more likely to develop in women who: Take antibiotic medicines. Have diabetes. Take birth control pills. Are pregnant. Douche often. Have a weak body defense system (immune system). Have been taking steroid medicines for a long time. Frequently wear tight clothing. What are the signs or symptoms? Symptoms of this condition include: White, thick, creamy vaginal discharge. Swelling, itching, redness, and irritation of the vagina. The lips of the vagina (labia) may be affected as well. Pain or a burning feeling while urinating. Pain during sex. How is this diagnosed? This condition is diagnosed based on: Your medical history. A physical exam. A pelvic exam. Your health care provider will examine a sample of your vaginal discharge under a microscope. Your health care provider may send this sample for testing to confirm the diagnosis. How is this treated? This condition is treated with medicine. Medicines may be over-the-counter or prescription. You may be told to use one or more of the following: Medicine that is taken by mouth (orally). Medicine that is applied as a cream (topically). Medicine that is inserted directly into the vagina (suppository). Follow these instructions at home: Take or apply over-the-counter and prescription medicines only as told by your health care provider. Do not use tampons until your health care provider approves. Do not have sex until your infection has  cleared. Sex can prolong or worsen your symptoms of infection. Ask your health care provider when it is safe to resume sexual activity. Keep all follow-up visits. This is important. How is this prevented?  Do not wear tight clothes, such as pantyhose or tight pants. Wear breathable cotton underwear. Do not use douches, perfumed soap, creams, or powders. Wipe from front to back after using the toilet. If you have diabetes, keep your blood sugar levels under control. Ask your health care provider for other ways to prevent yeast infections. Contact a health care provider if: You have a fever. Your symptoms go away and then return. Your symptoms do not get better with treatment. Your symptoms get worse. You have new symptoms. You develop blisters in or around your vagina. You have blood coming from your vagina and it is not your menstrual period. You develop pain in your abdomen. Summary Vaginal yeast infection is a condition that causes discharge as well as soreness, swelling, and redness (inflammation) of the vagina. This condition is treated with medicine. Medicines may be over-the-counter or prescription. Take or apply over-the-counter and prescription medicines only as told by your health care provider. Do not douche. Resume sexual activity or use of tampons as instructed by your health care provider. Contact a health care provider if your symptoms do not get better with treatment or your symptoms go away and then return. This information is not intended to replace advice given to you by your health care provider. Make sure you discuss any questions you have with your health care provider. Document Revised: 03/20/2020 Document Reviewed: 03/20/2020 Elsevier Patient Education  2024 Elsevier Inc.Pregnancy and Urinary Tract Infection  A urinary tract infection (UTI) is an  infection of any part of the urinary tract. This includes the kidneys, the tubes that connect the kidneys to the  bladder (ureters), the bladder, and the tube that carries urine out of the body (urethra). These organs make, store, and get rid of urine in the body. Your health care provider may use other names to describe the infection. An upper UTI affects the ureters and kidneys (pyelonephritis). A lower UTI affects the bladder (cystitis) and urethra (urethritis). Most UTIs are caused by bacteria in the genital area, around the entrance to the urinary tract. These bacteria grow and cause irritation and inflammation of the urinary tract. You are more likely to develop a UTI during pregnancy because: The physical and hormonal changes that your body goes through make it easier for bacteria to get into your urinary tract. Your growing baby puts pressure on your bladder and can affect urine flow. Pregnant women with diabetes are at an increased risk for developing a UTI. It is important to recognize and treat UTIs in pregnancy because they can cause serious complications for both you and your baby. How does this affect me? Symptoms of a UTI include: Needing to urinate right away (urgently) and often, even if urinating a small amount. Pain, burning, or having a hard time passing urine. Blood in the urine. Unusual, cloudy, and bad-smelling urine. Pain in the abdomen or lower back. Vaginal discharge. You may also have: Vomiting or a decreased appetite. Confusion. Irritability or tiredness. A fever. Diarrhea. A low level of red blood cells (anemia). The development of high blood pressure during pregnancy (preeclampsia). How does this affect my baby? An untreated UTI during pregnancy could lead to a kidney infection or an infection throughout the mother's body (systemic infection). This can cause health problems and affect the baby. Possible complications of an untreated UTI include: Your baby being born before 37 weeks of pregnancy (premature). Your baby being born with a low birth weight. Your baby having a  higher risk of having his or her skin or the white parts of the eyes turn yellow (jaundice). What can I do to lower my risk? To prevent a UTI: Do not hold urine for long periods of time. Empty your bladder as soon as you feel the urge. Always wipe from front to back, especially after a bowel movement. Use each tissue one time when you wipe. Empty your bladder after sex. Keep your genital area dry. Drink 6 to 8 glasses of water each day. Do not douche or use deodorant sprays. Wear cotton underwear and loose clothing. How is this treated? Treatment for this condition may include: Antibiotic medicines that are safe to take during pregnancy. Other medicines to treat less common causes of UTI. Follow these instructions at home: If you were prescribed an antibiotic medicine, take it as told by your health care provider. Do not stop using the antibiotic even if you start to feel better. Keep all follow-up visits. This is important. Contact a health care provider if: Your symptoms do not improve or they get worse. You have abnormal vaginal discharge. Get help right away if you: Have a fever. Have nausea and vomiting. Have back or side pain. Have lower belly pain, tightness, or feel contractions in your uterus. Have a gush of fluid from your vagina. Have blood in your urine. Summary A UTI is an infection of any part of the urinary tract, which includes the kidneys, ureters, bladder, and urethra. Most urinary tract infections are caused by bacteria in your  genital area, around the entrance to your urinary tract (urethra). You are more likely to develop a UTI during pregnancy. It is important to recognize and treat UTIs in pregnancy because of the risk of serious complications for both you and your baby. If you were prescribed an antibiotic medicine, take it as told by your health care provider. Do not stop using the antibiotic even if you start to feel better. This information is not intended  to replace advice given to you by your health care provider. Make sure you discuss any questions you have with your health care provider. Document Revised: 08/16/2020 Document Reviewed: 08/16/2020 Elsevier Patient Education  2024 [employer].

## 2023-02-28 ENCOUNTER — Encounter: Payer: Self-pay | Admitting: Certified Nurse Midwife

## 2023-02-28 DIAGNOSIS — B379 Candidiasis, unspecified: Secondary | ICD-10-CM

## 2023-02-28 LAB — CERVICOVAGINAL ANCILLARY ONLY
Bacterial Vaginitis (gardnerella): NEGATIVE
Candida Glabrata: NEGATIVE
Candida Vaginitis: POSITIVE — AB
Comment: NEGATIVE
Comment: NEGATIVE
Comment: NEGATIVE

## 2023-02-28 LAB — URINE CULTURE

## 2023-03-03 MED ORDER — MICONAZOLE NITRATE 2 % VA CREA
1.0000 | TOPICAL_CREAM | Freq: Every day | VAGINAL | 0 refills | Status: DC
Start: 2023-03-03 — End: 2023-05-15

## 2023-03-05 ENCOUNTER — Telehealth: Payer: Self-pay | Admitting: Obstetrics and Gynecology

## 2023-03-05 ENCOUNTER — Other Ambulatory Visit: Payer: Medicaid Other

## 2023-03-05 ENCOUNTER — Encounter: Payer: Medicaid Other | Admitting: Obstetrics and Gynecology

## 2023-03-05 NOTE — Telephone Encounter (Signed)
 Reached out to pt to reschedule appts that were scheduled on 2/192025-Lab appt at 8:40 and ROB appt with Dr. Valentino Saxon at 9:35.  Left message for pt to call back.

## 2023-03-06 ENCOUNTER — Encounter: Payer: Self-pay | Admitting: Obstetrics and Gynecology

## 2023-03-06 NOTE — Telephone Encounter (Signed)
 Reached out to pt (2x) to reschedule appts that were scheduled on 03/05/2023-Lab appt at 8:40 and ROB appt at 9:35 with Dr. Valentino Saxon.  Left message for pt to call back.  Will send a MyChart letter to pt.

## 2023-03-13 ENCOUNTER — Other Ambulatory Visit: Payer: Self-pay

## 2023-03-13 ENCOUNTER — Ambulatory Visit (HOSPITAL_BASED_OUTPATIENT_CLINIC_OR_DEPARTMENT_OTHER): Payer: Medicaid Other | Admitting: Obstetrics

## 2023-03-13 ENCOUNTER — Ambulatory Visit: Payer: Medicaid Other

## 2023-03-13 ENCOUNTER — Ambulatory Visit: Payer: Medicaid Other | Attending: Licensed Practical Nurse

## 2023-03-13 VITALS — BP 129/76 | HR 96

## 2023-03-13 DIAGNOSIS — Z3480 Encounter for supervision of other normal pregnancy, unspecified trimester: Secondary | ICD-10-CM | POA: Insufficient documentation

## 2023-03-13 DIAGNOSIS — Z3A34 34 weeks gestation of pregnancy: Secondary | ICD-10-CM | POA: Diagnosis not present

## 2023-03-13 DIAGNOSIS — Z363 Encounter for antenatal screening for malformations: Secondary | ICD-10-CM | POA: Diagnosis not present

## 2023-03-13 DIAGNOSIS — Z3A29 29 weeks gestation of pregnancy: Secondary | ICD-10-CM

## 2023-03-13 DIAGNOSIS — R9389 Abnormal findings on diagnostic imaging of other specified body structures: Secondary | ICD-10-CM | POA: Diagnosis present

## 2023-03-13 DIAGNOSIS — O43103 Malformation of placenta, unspecified, third trimester: Secondary | ICD-10-CM | POA: Diagnosis not present

## 2023-03-13 DIAGNOSIS — O358XX0 Maternal care for other (suspected) fetal abnormality and damage, not applicable or unspecified: Secondary | ICD-10-CM | POA: Diagnosis not present

## 2023-03-13 DIAGNOSIS — O09213 Supervision of pregnancy with history of pre-term labor, third trimester: Secondary | ICD-10-CM | POA: Diagnosis not present

## 2023-03-13 DIAGNOSIS — O283 Abnormal ultrasonic finding on antenatal screening of mother: Secondary | ICD-10-CM | POA: Diagnosis not present

## 2023-03-13 NOTE — Progress Notes (Signed)
 MFM Consult Note  Michele Bailey is currently at 29 weeks and 5 days.  She was seen as a hypoechoic area was noted on the inferior margin of the placenta on a recent ultrasound performed by radiology at [facility].  She denies any significant past medical history and denies any problems in her current pregnancy.    The patient reports that both her mother and her maternal grandfather were born with a cleft lip.  She had a cell free DNA test earlier in her pregnancy which indicated a low risk for trisomy 38, 28, and 13. A female fetus is predicted.   She was informed that the fetal growth and amniotic fluid level were appropriate for her gestational age.   There were no obvious fetal anomalies noted on today's ultrasound exam.  There were no signs of a cleft lip noted on today's exam.  Today's exam was limited due to her advanced gestational age.  The patient was informed that anomalies may be missed due to technical limitations. If the fetus is in a suboptimal position or maternal habitus is increased, visualization of the fetus in the maternal uterus may be impaired.  A normal-appearing anterior placenta was noted on today's exam.  The hypoechoic area noted on the lateral edge of the placenta is most likely a venous sinus which is a normal part of the placenta.   The patient stated that she was reassured by today's findings and stated that all of her questions were answered today.  No further exams were scheduled in our office.  A total of 30 minutes was spent counseling and coordinating the care for this patient.  Greater than 50% of the time was spent in direct face-to-face contact.

## 2023-03-14 ENCOUNTER — Encounter: Payer: Self-pay | Admitting: Certified Nurse Midwife

## 2023-03-20 ENCOUNTER — Ambulatory Visit: Payer: Medicaid Other | Admitting: Obstetrics

## 2023-03-20 ENCOUNTER — Other Ambulatory Visit: Payer: Medicaid Other

## 2023-03-20 VITALS — BP 125/74 | HR 86 | Wt 213.0 lb

## 2023-03-20 DIAGNOSIS — Z131 Encounter for screening for diabetes mellitus: Secondary | ICD-10-CM

## 2023-03-20 DIAGNOSIS — Z3A30 30 weeks gestation of pregnancy: Secondary | ICD-10-CM | POA: Diagnosis not present

## 2023-03-20 DIAGNOSIS — O99213 Obesity complicating pregnancy, third trimester: Secondary | ICD-10-CM | POA: Diagnosis not present

## 2023-03-20 DIAGNOSIS — Z23 Encounter for immunization: Secondary | ICD-10-CM | POA: Diagnosis not present

## 2023-03-20 DIAGNOSIS — Z3480 Encounter for supervision of other normal pregnancy, unspecified trimester: Secondary | ICD-10-CM

## 2023-03-20 DIAGNOSIS — Z114 Encounter for screening for human immunodeficiency virus [HIV]: Secondary | ICD-10-CM

## 2023-03-20 DIAGNOSIS — O99210 Obesity complicating pregnancy, unspecified trimester: Secondary | ICD-10-CM

## 2023-03-20 DIAGNOSIS — Z113 Encounter for screening for infections with a predominantly sexual mode of transmission: Secondary | ICD-10-CM

## 2023-03-20 DIAGNOSIS — E669 Obesity, unspecified: Secondary | ICD-10-CM | POA: Diagnosis not present

## 2023-03-20 DIAGNOSIS — Z369 Encounter for antenatal screening, unspecified: Secondary | ICD-10-CM

## 2023-03-20 NOTE — Patient Instructions (Signed)
 Diphtheria; Tetanus; Pertussis (DTaP or Tdap) Vaccine Injection What is this medication? DIPHTHERIA; TETANUS; PERTUSSIS VACCINE (dif THEER ee uh; TET n [place]; per TUS iss VAK seen) reduces the risk of diphtheria, tetanus (lockjaw), and pertussis (whooping cough). It does not treat diphtheria, tetanus, or pertussis. It is still possible to get diphtheria, tetanus, or pertussis after receiving this vaccine, but the symptoms may be less severe or not last as long. It works by helping your immune system learn how to fight off a future infection. This medicine may be used for other purposes; ask your health care provider or pharmacist if you have questions. COMMON BRAND NAME(S): Adacel, Boostrix, Certiva, Daptacel, Infanrix, Tripedia What should I tell my care team before I take this medication? They need to know if you have any of these conditions: Blood disorders, such as hemophilia Fever or infection Immune system problems Neurologic disease Seizures An unusual or allergic reaction to other vaccines, latex, other medications, foods, dyes, or preservatives Pregnant or trying to get pregnant Breastfeeding How should I use this medication? This vaccine is injected into a muscle. It is given by your care team. A copy of Vaccine Information Statements will be given before each vaccination. Be sure to read this information carefully each time. This sheet may change often. Talk to your care team about the use of this medication in children. While the DTaP vaccine may be given to children as young as 6 weeks and the Tdap vaccine may be given to children as young as 36 years old, precautions do apply. Overdosage: If you think you have taken too much of this medicine contact a poison control center or emergency room at once. NOTE: This medicine is only for you. Do not share this medicine with others. What if I miss a dose? It is important not to miss your dose. Call your care team if you are unable to keep an  appointment. What may interact with this medication? This medication may interact with the following: Certain medications that prevent or treat blood clots, such as warfarin, enoxaparin, dalteparin Immune globulin Medications that lower your chance of fighting an infection, such as adalimumab, anakinra, infliximab Medications to treat cancer Steroid medications, such as prednisone or cortisone This list may not describe all possible interactions. Give your health care provider a list of all the medicines, herbs, non-prescription drugs, or dietary supplements you use. Also tell them if you smoke, drink alcohol, or use illegal drugs. Some items may interact with your medicine. What should I watch for while using this medication? See your care team for all shots of this vaccine as directed. Report any side effects to your care team right away. This vaccine, like all vaccines, may not fully protect everyone. What side effects may I notice from receiving this medication? Side effects that you should report to your care team as soon as possible: Allergic reactions--skin rash, itching, hives, swelling of the face, lips, tongue, or throat Feeling faint or lightheaded Side effects that usually do not require medical attention (report these to your care team if they continue or are bothersome): Chills Fever General discomfort and fatigue Headache Joint pain Muscle pain Pain, redness, or irritation at injection site This list may not describe all possible side effects. Call your doctor for medical advice about side effects. You may report side effects to FDA at 1-800-FDA-1088. Where should I keep my medication? This vaccine is only given by your care team. It will not be stored at home. NOTE: This  sheet is a summary. It may not cover all possible information. If you have questions about this medicine, talk to your doctor, pharmacist, or health care provider.  2024 Elsevier/Gold Standard  (2021-07-02 00:00:00)

## 2023-03-20 NOTE — Progress Notes (Signed)
    Return Prenatal Note   Subjective  28 y.o. [name] at [redacted]w[redacted]d presents for this follow-up prenatal visit. Pregnancy notable for hx of PPROM/PTD at 33wks, hx of preeclampsia in prior pregnancy, marijuana use, HSV, and BMI.   Patient getting a little nervous as she approaches term, due to hx of PTD. No regular, painful contractions, is not feeling increased pelvic pressure, is making sure she is taking it easy. Had [place] on 2/27 with MFM re: possible placental lake and is aware placenta is normal. Growth was 31%ile ([redacted]w[redacted]d).   Patient reports: Movement: Present Contractions: Not present Denies vaginal bleeding or leaking fluid. Objective  Flow sheet Vitals: Pulse Rate: 86 BP: 125/74 Fundal Height: 31 cm Fetal Heart Rate (bpm): 142 Total weight gain: 18 lb (8.165 kg)  General Appearance  No acute distress, well appearing, and well nourished Pulmonary   Normal work of breathing Neurologic   Alert and oriented to person, place, and time Psychiatric   Mood and affect within normal limits  Assessment/Plan   Plan  27 y.o. [id number] at [redacted]w[redacted]d by LMP=6wk [place] presents for follow-up OB visit. Reviewed prenatal record including previous visit note.  1. Supervision of other normal pregnancy, antepartum (Primary) -Tdap, 1hGTT, BTC and 28wk labs done today.  -PTL precautions reviewed  2. Obesity in pregnancy -Should have growth scan at 32wks, most recent done 2/27.  -Will schedule next ROB w/growth [place] in 3wks for appropriate interval    Orders Placed This Encounter  Procedures   Tdap vaccine greater than or equal to 7yo IM   Return in about 3 weeks (around 04/10/2023) for ROB w/growth [place].   Future Appointments  Date Time Provider Department Center  04/11/2023 10:15 AM [facility]OB [place] 1 [facility]-IMG None  04/11/2023 11:15 AM Dominic, Courtney Heys, CNM AOB-AOB None   For next visit:  continue with routine prenatal care    [name], DO [facility]

## 2023-03-21 LAB — 28 WEEK RH+PANEL
Basophils Absolute: 0 10*3/uL (ref 0.0–0.2)
Basos: 0 %
EOS (ABSOLUTE): 0.1 10*3/uL (ref 0.0–0.4)
Eos: 1 %
Gestational Diabetes Screen: 142 mg/dL — ABNORMAL HIGH (ref 70–139)
HIV Screen 4th Generation wRfx: NONREACTIVE
Hematocrit: 33.5 % — ABNORMAL LOW (ref 34.0–46.6)
Hemoglobin: 11 g/dL — ABNORMAL LOW (ref 11.1–15.9)
Immature Grans (Abs): 0.1 10*3/uL (ref 0.0–0.1)
Immature Granulocytes: 1 %
Lymphocytes Absolute: 1.9 10*3/uL (ref 0.7–3.1)
Lymphs: 18 %
MCH: 31.9 pg (ref 26.6–33.0)
MCHC: 32.8 g/dL (ref 31.5–35.7)
MCV: 97 fL (ref 79–97)
Monocytes Absolute: 0.4 10*3/uL (ref 0.1–0.9)
Monocytes: 4 %
Neutrophils Absolute: 8 10*3/uL — ABNORMAL HIGH (ref 1.4–7.0)
Neutrophils: 76 %
Platelets: 359 10*3/uL (ref 150–450)
RBC: 3.45 x10E6/uL — ABNORMAL LOW (ref 3.77–5.28)
RDW: 12.7 % (ref 11.7–15.4)
RPR Ser Ql: NONREACTIVE
WBC: 10.5 10*3/uL (ref 3.4–10.8)

## 2023-03-24 ENCOUNTER — Encounter: Payer: Self-pay | Admitting: Certified Nurse Midwife

## 2023-03-24 DIAGNOSIS — O99810 Abnormal glucose complicating pregnancy: Secondary | ICD-10-CM | POA: Insufficient documentation

## 2023-03-26 ENCOUNTER — Other Ambulatory Visit: Payer: Self-pay

## 2023-03-26 ENCOUNTER — Telehealth: Payer: Self-pay | Admitting: Certified Nurse Midwife

## 2023-03-26 ENCOUNTER — Other Ambulatory Visit

## 2023-03-26 DIAGNOSIS — O24419 Gestational diabetes mellitus in pregnancy, unspecified control: Secondary | ICD-10-CM

## 2023-03-26 DIAGNOSIS — Z131 Encounter for screening for diabetes mellitus: Secondary | ICD-10-CM

## 2023-03-26 NOTE — Telephone Encounter (Signed)
 Reached out to pt to reschedule 3 hour glucose test that was scheduled on 03/26/2023 at 8:20 (per J. Keitha Butte).  Pt stated that something came up and she needed to reschedule.  Stated that she needed to reschedule.  Was able to reschedule the lab to 04/02/2023 at 8:20.

## 2023-04-02 ENCOUNTER — Other Ambulatory Visit

## 2023-04-03 ENCOUNTER — Other Ambulatory Visit: Payer: Self-pay | Admitting: Certified Nurse Midwife

## 2023-04-03 ENCOUNTER — Encounter: Payer: Self-pay | Admitting: Certified Nurse Midwife

## 2023-04-03 DIAGNOSIS — O24410 Gestational diabetes mellitus in pregnancy, diet controlled: Secondary | ICD-10-CM

## 2023-04-03 LAB — GESTATIONAL GLUCOSE TOLERANCE
Glucose, Fasting: 120 mg/dL — ABNORMAL HIGH (ref 70–94)
Glucose, GTT - 1 Hour: 190 mg/dL — ABNORMAL HIGH (ref 70–179)
Glucose, GTT - 2 Hour: 143 mg/dL (ref 70–154)
Glucose, GTT - 3 Hour: 109 mg/dL (ref 70–139)

## 2023-04-03 MED ORDER — LANCET DEVICE MISC: 1.0000 | Freq: Three times a day (TID) | 0 refills | Status: AC

## 2023-04-03 MED ORDER — BLOOD GLUCOSE TEST VI STRP: 1.0000 | ORAL_STRIP | Freq: Four times a day (QID) | 3 refills | Status: DC

## 2023-04-03 MED ORDER — BLOOD GLUCOSE TEST VI STRP
1.0000 | ORAL_STRIP | Freq: Four times a day (QID) | 3 refills | Status: DC
Start: 2023-04-03 — End: 2023-04-21

## 2023-04-03 MED ORDER — LANCETS MISC. MISC: 1.0000 | Freq: Three times a day (TID) | 0 refills | Status: DC

## 2023-04-03 MED ORDER — BLOOD GLUCOSE MONITORING SUPPL DEVI: 1.0000 | Freq: Four times a day (QID) | 0 refills | Status: DC

## 2023-04-03 NOTE — Addendum Note (Signed)
 Addended by: Hartley Barefoot on: 04/03/2023 07:37 PM   Modules accepted: Orders

## 2023-04-03 NOTE — Addendum Note (Signed)
 Addended by: Hartley Barefoot on: 04/03/2023 07:36 PM   Modules accepted: Orders

## 2023-04-05 ENCOUNTER — Encounter: Payer: Self-pay | Admitting: Certified Nurse Midwife

## 2023-04-08 ENCOUNTER — Encounter: Payer: Self-pay | Admitting: Obstetrics

## 2023-04-11 ENCOUNTER — Other Ambulatory Visit

## 2023-04-11 ENCOUNTER — Ambulatory Visit: Admitting: Licensed Practical Nurse

## 2023-04-11 ENCOUNTER — Encounter: Payer: Self-pay | Admitting: Licensed Practical Nurse

## 2023-04-11 VITALS — BP 122/82 | HR 97 | Wt 215.1 lb

## 2023-04-11 DIAGNOSIS — O99210 Obesity complicating pregnancy, unspecified trimester: Secondary | ICD-10-CM

## 2023-04-11 DIAGNOSIS — O99213 Obesity complicating pregnancy, third trimester: Secondary | ICD-10-CM

## 2023-04-11 DIAGNOSIS — Z3A33 33 weeks gestation of pregnancy: Secondary | ICD-10-CM

## 2023-04-11 DIAGNOSIS — E669 Obesity, unspecified: Secondary | ICD-10-CM | POA: Diagnosis not present

## 2023-04-11 DIAGNOSIS — Z3480 Encounter for supervision of other normal pregnancy, unspecified trimester: Secondary | ICD-10-CM

## 2023-04-11 DIAGNOSIS — O24410 Gestational diabetes mellitus in pregnancy, diet controlled: Secondary | ICD-10-CM

## 2023-04-11 DIAGNOSIS — O24419 Gestational diabetes mellitus in pregnancy, unspecified control: Secondary | ICD-10-CM | POA: Insufficient documentation

## 2023-04-11 LAB — POCT URINALYSIS DIPSTICK
Bilirubin, UA: NEGATIVE
Blood, UA: NEGATIVE
Ketones, UA: NEGATIVE
Leukocytes, UA: NEGATIVE
Nitrite, UA: NEGATIVE
Protein, UA: NEGATIVE
Spec Grav, UA: 1.01 (ref 1.010–1.025)
Urobilinogen, UA: 0.2 U/dL
pH, UA: 7 (ref 5.0–8.0)

## 2023-04-11 NOTE — Assessment & Plan Note (Signed)
-  TWG 20lbs, encouraged to increase physical activity

## 2023-04-11 NOTE — Progress Notes (Signed)
    Return Prenatal Note   Subjective   28 y.o. [id number] at [redacted]w[redacted]d presents for this follow-up prenatal visit.  Patient Doing well. Has pain on her lower right side that comes and goes mostly when changing position or when walking. Has been checking her glucose, most fasting's are elevated, she does eat late dinners. Also checking at bedtime out of curiosity  date fasting breakfast Lunch Dinner HS  3/21 101 116  92 129  3/22 109  124 112 118  3/23 102 122 83 102   3/24 105 139 111  125  3/25 89 111 123 89   3/26 107 134 92  137  3/27 102 100  109 125  3/28 97        Patient reports: Movement: Present Contractions: Not present  Objective   Flow sheet Vitals: Pulse Rate: 97 BP: 122/82 Fundal Height: 34 cm Fetal Heart Rate (bpm): 145 Total weight gain: 20 lb 1.6 oz (9.117 kg)  General Appearance  No acute distress, well appearing, and well nourished Pulmonary   Normal work of breathing Neurologic   Alert and oriented to person, place, and time Psychiatric   Mood and affect within normal limits  Assessment/Plan   Plan  28 y.o. [id number] at [redacted]w[redacted]d presents for follow-up OB visit. Reviewed prenatal record including previous visit note.  Obesity in pregnancy -TWG 20lbs, encouraged to increase physical activity   Supervision of other normal pregnancy, antepartum -has family to watch her children while in labor  -comfort measures for round ligament pain reviewed -GBS and gc/ct at next visit   Gestational diabetes -Diabetes education 4/2 -continue to monitor blood sugars and log diet  -may need medication if fasting's continue to be elevated.      Orders Placed This Encounter  Procedures   POCT Urinalysis Dipstick   Return in about 2 weeks (around 04/25/2023) for ROB, 36wk labs.   Future Appointments  Date Time Provider Department Center  04/16/2023  2:00 PM [facility] None  04/21/2023  1:00 PM [facility]  04/25/2023 10:15 AM Skilar Marcou, Courtney Heys, CNM [facility]-A[facility] None    For next visit:  continue with routine prenatal care     Ellouise Newer [facility], CNM  04/11/2510:39 PM

## 2023-04-11 NOTE — Assessment & Plan Note (Signed)
-  has family to watch her children while in labor  -comfort measures for round ligament pain reviewed -GBS and gc/ct at next visit

## 2023-04-11 NOTE — Assessment & Plan Note (Addendum)
-  Diabetes education 4/2 -continue to monitor blood sugars and log diet  -may need medication if fasting's continue to be elevated.

## 2023-04-15 ENCOUNTER — Encounter: Payer: Self-pay | Admitting: Certified Nurse Midwife

## 2023-04-16 ENCOUNTER — Encounter: Attending: Certified Nurse Midwife | Admitting: Dietician

## 2023-04-16 ENCOUNTER — Encounter: Payer: Self-pay | Admitting: Dietician

## 2023-04-16 DIAGNOSIS — Z713 Dietary counseling and surveillance: Secondary | ICD-10-CM | POA: Diagnosis not present

## 2023-04-16 DIAGNOSIS — O24410 Gestational diabetes mellitus in pregnancy, diet controlled: Secondary | ICD-10-CM | POA: Insufficient documentation

## 2023-04-16 DIAGNOSIS — O24419 Gestational diabetes mellitus in pregnancy, unspecified control: Secondary | ICD-10-CM

## 2023-04-16 DIAGNOSIS — Z3A34 34 weeks gestation of pregnancy: Secondary | ICD-10-CM | POA: Diagnosis not present

## 2023-04-16 NOTE — Progress Notes (Signed)
 Patient was seen on 04/16/2023 for Gestational Diabetes self-management class at the [facility]. The following learning objectives were met by the patient during this course:  States the definition of Gestational Diabetes States why dietary management is important in controlling blood glucose Describes the effects each nutrient has on blood glucose levels Demonstrates ability to create a balanced meal plan Demonstrates carbohydrate counting  States when to check blood glucose levels Demonstrates proper blood glucose monitoring techniques States the effect of stress and exercise on blood glucose levels States the importance of limiting caffeine and abstaining from alcohol and smoking   Patient has a meter prior to visit. Patient is testing pre breakfast and 2 hours after each meal. FBS: 95-100 Postprandial: 130-150   Patient instructed to monitor glucose levels: FBS: 60 - <90 1 hour: <140 2 hour: <120  *Patient received handouts: Nutrition Diabetes and Pregnancy Blood glucose log Food list Gestational Diabetes General Nutrition Guidelines  Patient will be seen for follow-up as needed.

## 2023-04-21 ENCOUNTER — Other Ambulatory Visit: Payer: Self-pay

## 2023-04-21 ENCOUNTER — Encounter: Payer: Self-pay | Admitting: Certified Nurse Midwife

## 2023-04-21 ENCOUNTER — Ambulatory Visit
Admission: RE | Admit: 2023-04-21 | Discharge: 2023-04-21 | Disposition: A | Discharge status: Home / Self Care | Source: Ambulatory Visit | Attending: Certified Nurse Midwife | Admitting: Certified Nurse Midwife

## 2023-04-21 DIAGNOSIS — O24410 Gestational diabetes mellitus in pregnancy, diet controlled: Secondary | ICD-10-CM | POA: Diagnosis present

## 2023-04-21 DIAGNOSIS — O24419 Gestational diabetes mellitus in pregnancy, unspecified control: Secondary | ICD-10-CM

## 2023-04-21 MED ORDER — BLOOD GLUCOSE TEST VI STRP: 1.0000 | ORAL_STRIP | Freq: Four times a day (QID) | 3 refills | Status: DC

## 2023-04-21 MED ORDER — LANCETS MISC. MISC: 1.0000 | Freq: Three times a day (TID) | 0 refills | Status: DC

## 2023-04-25 ENCOUNTER — Telehealth: Payer: Self-pay | Admitting: Licensed Practical Nurse

## 2023-04-25 ENCOUNTER — Encounter: Admitting: Licensed Practical Nurse

## 2023-04-25 NOTE — Telephone Encounter (Signed)
 Reached out to pt to reschedule ROB appt that was scheduled on 04/25/2023 at 10:15 with LMD.  Left message for pt to call back to reschedule.

## 2023-04-28 ENCOUNTER — Encounter: Payer: Self-pay | Admitting: Licensed Practical Nurse

## 2023-04-28 NOTE — Telephone Encounter (Signed)
 Reached out to pt (2x) to reschedule ROB appt that was scheduled on 04/25/2023 at 10:15 with LMD.  Left message for pt to call back to reschedule.  Will send a MyChart letter to pt.

## 2023-05-07 ENCOUNTER — Other Ambulatory Visit (HOSPITAL_COMMUNITY)
Admission: RE | Admit: 2023-05-07 | Discharge: 2023-05-07 | Disposition: A | Discharge status: Home / Self Care | Source: Ambulatory Visit | Attending: Obstetrics | Admitting: Obstetrics

## 2023-05-07 ENCOUNTER — Ambulatory Visit (INDEPENDENT_AMBULATORY_CARE_PROVIDER_SITE_OTHER): Admitting: Obstetrics

## 2023-05-07 VITALS — BP 119/87 | HR 91 | Wt 222.0 lb

## 2023-05-07 DIAGNOSIS — O9883 Other maternal infectious and parasitic diseases complicating the puerperium: Secondary | ICD-10-CM | POA: Diagnosis not present

## 2023-05-07 DIAGNOSIS — B372 Candidiasis of skin and nail: Secondary | ICD-10-CM | POA: Diagnosis not present

## 2023-05-07 DIAGNOSIS — Z3685 Encounter for antenatal screening for Streptococcus B: Secondary | ICD-10-CM

## 2023-05-07 DIAGNOSIS — Z3480 Encounter for supervision of other normal pregnancy, unspecified trimester: Secondary | ICD-10-CM

## 2023-05-07 DIAGNOSIS — Z113 Encounter for screening for infections with a predominantly sexual mode of transmission: Secondary | ICD-10-CM | POA: Insufficient documentation

## 2023-05-07 DIAGNOSIS — O99210 Obesity complicating pregnancy, unspecified trimester: Secondary | ICD-10-CM

## 2023-05-07 DIAGNOSIS — Z3A37 37 weeks gestation of pregnancy: Secondary | ICD-10-CM | POA: Diagnosis not present

## 2023-05-07 MED ORDER — FLUCONAZOLE 150 MG PO TABS: 150.0000 mg | ORAL_TABLET | ORAL | 0 refills | Status: DC

## 2023-05-07 NOTE — Progress Notes (Signed)
    Return Prenatal Note   Subjective  28 y.o. [name] at [redacted]w[redacted]d presents for this follow-up prenatal visit. Pregnancy notable for hx of PPROM/PTD at 33wks, hx of preeclampsia in prior pregnancy, marijuana use, HSV, and BMI.   Patient with vulvar itching and irritation, concerned for yeast infection externally, no vaginal symptoms.  Patient reports: Movement: Present Contractions: Not present Denies vaginal bleeding or leaking fluid. Objective  Flow sheet Vitals: Pulse Rate: 91 BP: 119/87 Fundal Height: 37 cm Fetal Heart Rate (bpm): 153 Total weight gain: 27 lb (12.2 kg)  General Appearance  No acute distress, well appearing, and well nourished Pulmonary   Normal work of breathing Neurologic   Alert and oriented to person, place, and time Psychiatric   Mood and affect within normal limits GU    Vulva with red, mildly edematous patches in bilat inguinal folds c/w yeast  Assessment/Plan   Plan  27 y.o. [id number] at [redacted]w[redacted]d by LMP=6wk US  presents for follow-up OB visit. Reviewed prenatal record including previous visit note.  1. Supervision of other normal pregnancy, antepartum (Primary) - GBS and GC/CT swabs done today -Labor precautions reviewed -Getting ready for the hospital discussed  2. Candidal intertrigo -Of bilateral inguinal folds; pt preferring oral treatment > topical -Rx for Diflucan  x 3 -Notify provider at next visit if not responding   Orders Placed This Encounter  Procedures   Strep Gp B NAA   Return in about 1 week (around 05/14/2023) for rob.   Future Appointments  Date Time Provider Department Center  05/15/2023  8:55 AM Alise Appl, CNM [facility] None    For next visit:  continue with routine prenatal care   Sofia Dunn, DO [facility]

## 2023-05-07 NOTE — Patient Instructions (Signed)
 Signs and Symptoms of Labor Labor is the body's natural process of moving the baby and the placenta out of the uterus. The process of labor usually starts when the baby is full-term, between 74 and 41 weeks of pregnancy. Signs and symptoms that you are close to going into labor As your body prepares for labor and the birth of your baby, you may notice the following symptoms in the weeks and days before true labor starts: Passing a small amount of thick, bloody mucus from your vagina. This is called normal bloody show or losing your mucus plug. This may happen more than a week before labor begins, or right before labor begins, as the opening of the cervix starts to widen (dilate). For some women, the entire mucus plug passes at once. For others, pieces of the mucus plug may gradually pass over several days. Your baby moving (dropping) lower in your pelvis to get into position for birth (lightening). When this happens, you may feel more pressure on your bladder and pelvic bone and less pressure on your ribs. This may make it easier to breathe. It may also cause you to need to urinate more often and have problems with bowel movements. Having "practice contractions," also called Braxton Hicks contractions or false labor. These occur at irregular (unevenly spaced) intervals that are more than 10 minutes apart. False labor contractions are common after exercise or sexual activity. They will stop if you change position, rest, or drink fluids. These contractions are usually mild and do not get stronger over time. They may feel like: A backache or back pain. Mild cramps, similar to menstrual cramps. Tightening or pressure in your abdomen. Other early symptoms include: Nausea or loss of appetite. Diarrhea. Having a sudden burst of energy, or feeling very tired. Mood changes. Having trouble sleeping. Signs and symptoms that labor has begun Signs that you are in labor may include: Having contractions that come  at regular (evenly spaced) intervals and increase in intensity. This may feel like more intense tightening or pressure in your abdomen that moves to your back. Contractions may also feel like rhythmic pain in your upper thighs or back that comes and goes at regular intervals. If you are delivering for the first time, this change in intensity of contractions often occurs at a more gradual pace. If you have given birth before, you may notice a more rapid progression of contraction changes. Feeling pressure in the vaginal area. Your water breaking (rupture of membranes). This is when the sac of fluid that surrounds your baby breaks. Fluid leaking from your vagina may be clear or blood-tinged. Labor usually starts within 24 hours of your water breaking, but it may take longer to begin. Some people may feel a sudden gush of fluid; others may notice repeatedly damp underwear. Follow these instructions at home:  When labor starts, or if your water breaks, call your health care provider or nurse care line. Based on your situation, they will determine when you should go in for an exam. During early labor, you may be able to rest and manage symptoms at home. Some strategies to try at home include: Breathing and relaxation techniques. Taking a warm bath or shower. Listening to music. Using a heating pad on the lower back for pain. If directed, apply heat to the area as often as told by your health care provider. Use the heat source that your health care provider recommends, such as a moist heat pack or a heating pad. Place a  towel between your skin and the heat source. Leave the heat on for 20-30 minutes. Remove the heat if your skin turns bright red. This is especially important if you are unable to feel pain, heat, or cold. You have a greater risk of getting burned. Contact a health care provider if: Your labor has started. Your water breaks. You have nausea, vomiting, or diarrhea. Get help right away  if: You have painful, regular contractions that are 5 minutes apart or less. Labor starts before you are [redacted] weeks along in your pregnancy. You have a fever. You have bright red blood coming from your vagina. You do not feel your baby moving. You have a severe headache with or without vision problems. You have chest pain or shortness of breath. These symptoms may represent a serious problem that is an emergency. Do not wait to see if the symptoms will go away. Get medical help right away. Call your local emergency services (911 in the [place] Do not drive yourself to the hospital. Summary Labor is your body's natural process of moving your baby and the placenta out of your uterus. The process of labor usually starts when your baby is full-term, between 25 and 40 weeks of pregnancy. When labor starts, or if your water breaks, call your health care provider or nurse care line. Based on your situation, they will determine when you should go in for an exam. This information is not intended to replace advice given to you by your health care provider. Make sure you discuss any questions you have with your health care provider. Document Revised: 05/16/2020 Document Reviewed: 05/16/2020 Elsevier Patient Education  2024 [employer].

## 2023-05-08 LAB — CERVICOVAGINAL ANCILLARY ONLY
Chlamydia: NEGATIVE
Comment: NEGATIVE
Comment: NORMAL
Neisseria Gonorrhea: NEGATIVE

## 2023-05-09 ENCOUNTER — Encounter: Payer: Self-pay | Admitting: Certified Nurse Midwife

## 2023-05-09 ENCOUNTER — Encounter: Payer: Self-pay | Admitting: Obstetrics

## 2023-05-09 LAB — STREP GP B NAA: Strep Gp B NAA: POSITIVE — AB

## 2023-05-12 ENCOUNTER — Encounter: Payer: Self-pay | Admitting: Obstetrics

## 2023-05-15 ENCOUNTER — Other Ambulatory Visit: Payer: Self-pay | Admitting: Certified Nurse Midwife

## 2023-05-15 ENCOUNTER — Encounter: Payer: Self-pay | Admitting: Obstetrics and Gynecology

## 2023-05-15 ENCOUNTER — Other Ambulatory Visit: Payer: Self-pay

## 2023-05-15 ENCOUNTER — Observation Stay

## 2023-05-15 ENCOUNTER — Inpatient Hospital Stay
Admission: EM | Admit: 2023-05-15 | Discharge: 2023-05-19 | DRG: 787 | Disposition: A | Discharge status: Home / Self Care | Attending: Licensed Practical Nurse | Admitting: Licensed Practical Nurse

## 2023-05-15 ENCOUNTER — Observation Stay
Admission: EM | Admit: 2023-05-15 | Discharge: 2023-05-15 | Disposition: A | Discharge status: Home / Self Care | Source: Home / Self Care | Admitting: Obstetrics and Gynecology

## 2023-05-15 ENCOUNTER — Ambulatory Visit: Admitting: Certified Nurse Midwife

## 2023-05-15 VITALS — BP 125/88 | HR 88 | Wt 224.7 lb

## 2023-05-15 DIAGNOSIS — Z23 Encounter for immunization: Secondary | ICD-10-CM | POA: Diagnosis not present

## 2023-05-15 DIAGNOSIS — Z3A39 39 weeks gestation of pregnancy: Secondary | ICD-10-CM | POA: Diagnosis not present

## 2023-05-15 DIAGNOSIS — O288 Other abnormal findings on antenatal screening of mother: Secondary | ICD-10-CM | POA: Diagnosis present

## 2023-05-15 DIAGNOSIS — O99824 Streptococcus B carrier state complicating childbirth: Secondary | ICD-10-CM | POA: Diagnosis present

## 2023-05-15 DIAGNOSIS — O403XX0 Polyhydramnios, third trimester, not applicable or unspecified: Secondary | ICD-10-CM | POA: Diagnosis present

## 2023-05-15 DIAGNOSIS — O368330 Maternal care for abnormalities of the fetal heart rate or rhythm, third trimester, not applicable or unspecified: Principal | ICD-10-CM | POA: Insufficient documentation

## 2023-05-15 DIAGNOSIS — O24424 Gestational diabetes mellitus in childbirth, insulin controlled: Secondary | ICD-10-CM | POA: Diagnosis present

## 2023-05-15 DIAGNOSIS — K219 Gastro-esophageal reflux disease without esophagitis: Secondary | ICD-10-CM | POA: Diagnosis present

## 2023-05-15 DIAGNOSIS — O99213 Obesity complicating pregnancy, third trimester: Secondary | ICD-10-CM | POA: Diagnosis not present

## 2023-05-15 DIAGNOSIS — O99810 Abnormal glucose complicating pregnancy: Secondary | ICD-10-CM

## 2023-05-15 DIAGNOSIS — O24419 Gestational diabetes mellitus in pregnancy, unspecified control: Secondary | ICD-10-CM | POA: Diagnosis present

## 2023-05-15 DIAGNOSIS — O9081 Anemia of the puerperium: Secondary | ICD-10-CM | POA: Diagnosis not present

## 2023-05-15 DIAGNOSIS — Z8249 Family history of ischemic heart disease and other diseases of the circulatory system: Secondary | ICD-10-CM

## 2023-05-15 DIAGNOSIS — Z87891 Personal history of nicotine dependence: Secondary | ICD-10-CM

## 2023-05-15 DIAGNOSIS — O99324 Drug use complicating childbirth: Secondary | ICD-10-CM | POA: Diagnosis present

## 2023-05-15 DIAGNOSIS — O24410 Gestational diabetes mellitus in pregnancy, diet controlled: Secondary | ICD-10-CM | POA: Diagnosis not present

## 2023-05-15 DIAGNOSIS — D62 Acute posthemorrhagic anemia: Secondary | ICD-10-CM | POA: Diagnosis not present

## 2023-05-15 DIAGNOSIS — Z3480 Encounter for supervision of other normal pregnancy, unspecified trimester: Principal | ICD-10-CM

## 2023-05-15 DIAGNOSIS — O368390 Maternal care for abnormalities of the fetal heart rate or rhythm, unspecified trimester, not applicable or unspecified: Secondary | ICD-10-CM

## 2023-05-15 DIAGNOSIS — O9832 Other infections with a predominantly sexual mode of transmission complicating childbirth: Secondary | ICD-10-CM | POA: Diagnosis present

## 2023-05-15 DIAGNOSIS — O409XX0 Polyhydramnios, unspecified trimester, not applicable or unspecified: Secondary | ICD-10-CM | POA: Insufficient documentation

## 2023-05-15 DIAGNOSIS — O09213 Supervision of pregnancy with history of pre-term labor, third trimester: Secondary | ICD-10-CM | POA: Diagnosis not present

## 2023-05-15 DIAGNOSIS — Z880 Allergy status to penicillin: Secondary | ICD-10-CM | POA: Diagnosis not present

## 2023-05-15 DIAGNOSIS — F1290 Cannabis use, unspecified, uncomplicated: Secondary | ICD-10-CM | POA: Diagnosis present

## 2023-05-15 DIAGNOSIS — A6000 Herpesviral infection of urogenital system, unspecified: Secondary | ICD-10-CM | POA: Diagnosis present

## 2023-05-15 DIAGNOSIS — O9962 Diseases of the digestive system complicating childbirth: Secondary | ICD-10-CM | POA: Diagnosis present

## 2023-05-15 DIAGNOSIS — Z3A38 38 weeks gestation of pregnancy: Secondary | ICD-10-CM

## 2023-05-15 DIAGNOSIS — O99820 Streptococcus B carrier state complicating pregnancy: Secondary | ICD-10-CM | POA: Diagnosis not present

## 2023-05-15 LAB — COMPREHENSIVE METABOLIC PANEL WITH GFR
ALT: 14 U/L (ref 0–44)
AST: 21 U/L (ref 15–41)
Albumin: 3.1 g/dL — ABNORMAL LOW (ref 3.5–5.0)
Alkaline Phosphatase: 156 U/L — ABNORMAL HIGH (ref 38–126)
Anion gap: 11 (ref 5–15)
BUN: 10 mg/dL (ref 6–20)
CO2: 17 mmol/L — ABNORMAL LOW (ref 22–32)
Calcium: 8.9 mg/dL (ref 8.9–10.3)
Chloride: 105 mmol/L (ref 98–111)
Creatinine, Ser: 0.57 mg/dL (ref 0.44–1.00)
GFR, Estimated: >60 mL/min (ref >60)
Glucose, Bld: 97 mg/dL (ref 70–99)
Potassium: 3.2 mmol/L — ABNORMAL LOW (ref 3.5–5.1)
Sodium: 133 mmol/L — ABNORMAL LOW (ref 135–145)
Total Bilirubin: 0.5 mg/dL (ref 0.0–1.2)
Total Protein: 6.9 g/dL (ref 6.5–8.1)

## 2023-05-15 LAB — URINE DRUG SCREEN, QUALITATIVE (ARMC ONLY)
Amphetamines, Ur Screen: NOT DETECTED
Barbiturates, Ur Screen: NOT DETECTED
Benzodiazepine, Ur Scrn: NOT DETECTED
Cannabinoid 50 Ng, Ur ~~LOC~~: NOT DETECTED
Cocaine Metabolite,Ur ~~LOC~~: NOT DETECTED
MDMA (Ecstasy)Ur Screen: NOT DETECTED
Methadone Scn, Ur: NOT DETECTED
Opiate, Ur Screen: NOT DETECTED
Phencyclidine (PCP) Ur S: NOT DETECTED
Tricyclic, Ur Screen: NOT DETECTED

## 2023-05-15 LAB — PROTEIN / CREATININE RATIO, URINE
Creatinine, Urine: 99 mg/dL
Protein Creatinine Ratio: 0.1 mg/mg{creat} (ref 0.00–0.15)
Total Protein, Urine: 10 mg/dL

## 2023-05-15 LAB — CBC
HCT: 32.6 % — ABNORMAL LOW (ref 36.0–46.0)
Hemoglobin: 11.2 g/dL — ABNORMAL LOW (ref 12.0–15.0)
MCH: 31.8 pg (ref 26.0–34.0)
MCHC: 34.4 g/dL (ref 30.0–36.0)
MCV: 92.6 fL (ref 80.0–100.0)
Platelets: 348 10*3/uL (ref 150–400)
RBC: 3.52 MIL/uL — ABNORMAL LOW (ref 3.87–5.11)
RDW: 13.2 % (ref 11.5–15.5)
WBC: 11.7 10*3/uL — ABNORMAL HIGH (ref 4.0–10.5)
nRBC: 0 % (ref 0.0–0.2)

## 2023-05-15 LAB — TYPE AND SCREEN
ABO/RH(D): B POS
Antibody Screen: NEGATIVE

## 2023-05-15 MED ORDER — OXYTOCIN-SODIUM CHLORIDE 30-0.9 UT/500ML-% IV SOLN
2.5000 [IU]/h | INTRAVENOUS | Status: DC
  Administered 2023-05-17: 30 [IU] via INTRAVENOUS
  Filled 2023-05-15: qty 500

## 2023-05-15 MED ORDER — OXYTOCIN BOLUS FROM INFUSION: 333.0000 mL | Freq: Once | INTRAVENOUS | Status: DC

## 2023-05-15 MED ORDER — LACTATED RINGERS IV SOLN: 500.0000 mL | INTRAVENOUS | Status: DC | PRN

## 2023-05-15 MED ORDER — ACETAMINOPHEN 325 MG PO TABS: 650.0000 mg | ORAL_TABLET | ORAL | Status: DC | PRN

## 2023-05-15 MED ORDER — PENICILLIN G POT IN DEXTROSE 60000 UNIT/ML IV SOLN
3.0000 10*6.[IU] | INTRAVENOUS | Status: DC
  Administered 2023-05-16 – 2023-05-17 (×8): 3 10*6.[IU] via INTRAVENOUS
  Filled 2023-05-15 (×8): qty 50

## 2023-05-15 MED ORDER — LACTATED RINGERS IV SOLN: INTRAVENOUS | Status: AC

## 2023-05-15 MED ORDER — HYDROXYZINE HCL 25 MG PO TABS: 50.0000 mg | ORAL_TABLET | Freq: Four times a day (QID) | ORAL | Status: DC | PRN

## 2023-05-15 MED ORDER — LACTATED RINGERS IV SOLN: 500.0000 mL | INTRAVENOUS | Status: AC | PRN

## 2023-05-15 MED ORDER — TERBUTALINE SULFATE 1 MG/ML IJ SOLN
0.2500 mg | Freq: Once | INTRAMUSCULAR | Status: AC | PRN
  Administered 2023-05-17: 0.25 mg via SUBCUTANEOUS
  Filled 2023-05-15: qty 1

## 2023-05-15 MED ORDER — SOD CITRATE-CITRIC ACID 500-334 MG/5ML PO SOLN
30.0000 mL | ORAL | Status: DC | PRN
  Administered 2023-05-17: 30 mL via ORAL

## 2023-05-15 MED ORDER — SODIUM CHLORIDE 0.9 % IV SOLN
5.0000 10*6.[IU] | Freq: Once | INTRAVENOUS | Status: AC
  Administered 2023-05-16: 5 10*6.[IU] via INTRAVENOUS
  Filled 2023-05-15: qty 5

## 2023-05-15 MED ORDER — OXYTOCIN-SODIUM CHLORIDE 30-0.9 UT/500ML-% IV SOLN
1.0000 m[IU]/min | INTRAVENOUS | Status: DC
  Administered 2023-05-16 – 2023-05-17 (×2): 2 m[IU]/min via INTRAVENOUS

## 2023-05-15 MED ORDER — LIDOCAINE HCL (PF) 1 % IJ SOLN
30.0000 mL | INTRAMUSCULAR | Status: DC | PRN
  Filled 2023-05-15: qty 30

## 2023-05-15 MED ORDER — ONDANSETRON HCL 4 MG/2ML IJ SOLN: 4.0000 mg | Freq: Four times a day (QID) | INTRAMUSCULAR | Status: DC | PRN

## 2023-05-15 MED ORDER — SOD CITRATE-CITRIC ACID 500-334 MG/5ML PO SOLN: 30.0000 mL | ORAL | Status: DC | PRN

## 2023-05-15 NOTE — OB Triage Note (Signed)
 LABOR & DELIVERY OB TRIAGE NOTE  SUBJECTIVE  HPI Michele Bailey is a 28 y.o. [name] at [redacted]w[redacted]d who presents to Labor & Delivery for NRNST in clinic. Endorses fetal movement, denies contractions, loss of fluid or vaginal bleeding.  Pregnancy complicated by GDM, has only started checking blood sugars 3-4d ago.  OB History     Gravida  7   Para  2   Term  1   Preterm  1   AB  4   Living  2      SAB  2   IAB  2   Ectopic      Multiple  0   Live Births  2        Obstetric Comments  MGF- had twin that passed away age 44yr. Fob family has hx of twins         Scheduled Meds: Continuous Infusions:  lactated ringers      PRN Meds:.acetaminophen , lactated ringers , ondansetron , sodium citrate -citric acid   OBJECTIVE  BP 130/85 (BP Location: Left Arm)   Pulse 75   Temp 98.1 F (36.7 C) (Oral)   Resp 18   LMP 08/17/2022 (Exact Date)   General: A&Ox4, NAD Heart: regular rate Lungs: normal effort Abdomen: soft, nontender, gravid Cervical exam:   deferred  NST I reviewed the NST and it was reactive.  Baseline: 140 Variability: moderate Accelerations:  present Decelerations:variable x2 Toco: occasional contractions, not felt by patient, soft resting tone Category I  ASSESSMENT Impression  1) Pregnancy at [id number], [redacted]w[redacted]d, Estimated Date of Delivery: 05/24/23 2) Reassuring maternal/fetal status 3) Poorly controlled GDM 4) AFI 23.3cm 5) Periods of minimal variability on EFM  PLAN Discharge to home to gather belongings, return for IOL tonight due to poorly controlled GDM, borderline AFI.  Forestine Igo, CNM 05/15/23  6:05 PM

## 2023-05-15 NOTE — OB Triage Note (Signed)
 28 y.o G7P2 presents to L&D triage for a non-reactive NST in the office today. She reports good fetal movement, denies vaginal bleeding, regular ctx's. Vitals wnl, monitors applied and assessing. POC includes NST and BPP. Irine Manning CNM aware of pt arrival.

## 2023-05-15 NOTE — H&P (Signed)
 [facility] Labor & Delivery  History and Physical   HPI   Chief Complaint: her for induction of labor  Michele Bailey is a 28 y.o. [name] at [redacted]w[redacted]d who presents for induction of labor after non-reactive NST today in clinic.  Pregnancy complicated by GDM-has only checked sugars for 3 days, 2/3 fastings elevated. AFI today 23cm. Hx of pre-eclampsia in prior pregnancy, hx of preterm delivery after PPROM in prior pregnancy. Hx of HSV, no lesions.   Patient denies loss of fluid, vaginal bleeding, painful contractions..  Pregnancy Complications Patient Active Problem List   Diagnosis Date Noted   NST (non-stress test) nonreactive 05/15/2023   Labor and delivery indication for care or intervention 05/15/2023   Gestational diabetes 04/11/2023   Impaired glucose in pregnancy, antepartum 03/24/2023   Marijuana use during pregnancy 02/12/2023   History of preterm premature rupture of membranes (PPROM) 02/12/2023   History of preterm delivery, currently pregnant 11/14/2022   History of pre-eclampsia in prior pregnancy, currently pregnant 11/14/2022   Obesity in pregnancy 11/04/2022   Herpes simplex infection during pregnancy 11/04/2022   Supervision of other normal pregnancy, antepartum 10/25/2022    Review of Systems A twelve point review of systems was negative except as stated in HPI.   HISTORY   Medications Medications Prior to Admission  Medication Sig Dispense Refill Last Dose/Taking   Prenatal Vit-Fe Fumarate-FA (PREPLUS) 27-1 MG TABS Take 1 tablet by mouth daily. 90 tablet 3 05/14/2023   Blood Glucose Monitoring Suppl DEVI 1 each by Does not apply route 4 (four) times daily. May substitute to any manufacturer covered by patient's insurance. (Patient not taking: Reported on 05/15/2023) 1 each 0    fluconazole  (DIFLUCAN ) 150 MG tablet Take 1 tablet (150 mg total) by mouth every 3 (three) days. (Patient not taking: Reported on 05/15/2023) 3 tablet 0 Not Taking    Glucose Blood (BLOOD GLUCOSE TEST STRIPS) STRP 1 each by In Vitro route 4 (four) times daily. May substitute to any manufacturer covered by patient's insurance. Check blood sugar fasting and 2 hours after each meal. (Patient not taking: Reported on 05/15/2023) 200 strip 3    Lancets Misc. MISC 1 each by Does not apply route in the morning, at noon, and at bedtime. May substitute to any manufacturer covered by patient's insurance. Check blood sugar fasting and 2 hours after each meal. (Patient not taking: Reported on 05/15/2023) 200 each 0     Allergies is allergic to amoxicillin -pot clavulanate.   OB History OB History  Gravida Para Term Preterm AB Living  7 2 1 1 4 2   SAB IAB Ectopic Multiple Live Births  2 2 0 0 2    # Outcome Date GA Lbr Len/2nd Weight Sex Type Anes PTL Lv  7 Current           6 SAB 12/01/21          5 Preterm 02/08/18 [redacted]w[redacted]d / 00:04 2330 g M Vag-Spont EPI  LIV     Name: HONORE, PENSE     Apgar1: 8  Apgar5: 9  4 SAB 2019          3 IAB 2018     TAB     2 Term 08/25/15 [redacted]w[redacted]d / 01:24 3290 g F Vag-Spont EPI  LIV     Name: ISABELLAMARIE, MELLIES     Apgar1: 1  Apgar5: 9  1 IAB 2014            Obstetric Comments  MGF- had twin that passed away age 9yr.  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 Her smoking use included  cigarettes. She has been exposed to tobacco smoke. She has never used smokeless tobacco. She reports that she does not drink alcohol and does not use drugs.   Family History family history includes Cancer (age of onset: 11) in her maternal grandfather and paternal grandfather; Cancer - Other in an other family member; Cleft palate in her maternal grandfather and mother; Esophageal cancer (age of onset: 92) in her father; Healthy in her maternal grandmother; Hyperlipidemia in her father; Hypertension in her father; Kidney failure in her paternal aunt; Migraines in her mother; Ovarian cancer in an other family member; Rheum arthritis in her maternal grandfather and mother; Thyroid disease in her mother.   PHYSICAL EXAM   Vitals:   05/15/23 2203  BP: 133/89  Pulse: 85  Temp: 98.2 F (36.8 C)  TempSrc: Oral    Constitutional: No acute distress, well appearing, and well nourished. Neurologic: She is alert and conversational.  Psychiatric: She has a normal mood and affect.  Musculoskeletal: Normal gait, grossly normal range of motion Cardiovascular: Normal rate.   Pulmonary/Chest: Normal work of breathing.  Gastrointestinal/Abdominal: Soft. Gravid. There is no tenderness.  Skin: Skin is warm and dry. No rash noted.  Genitourinary: Normal external female genitalia. No lesions, tissue intact SVE:   Dilation: (P) 2 Effacement (%): (P) 50 Exam by:: (P) Michele Manning CNM   NST Interpretation Indication: IOL Baseline: 145 bpm Variability: moderate, minimal Accelerations: present Decelerations: absent Contractions: irregular, every 2-5 minutes Time noted:  See OBIX Impression: equivocal Authenticated by: Forestine Igo    PRENATAL LABS FROM OB RESULTS CONSOLE  ABO, Rh: --/--/PENDING (05/01 2144) Antibody: PENDING (05/01 2144) Rubella: 4.33 (10/31 1111) RPR: Non Reactive (03/06 1038)  HBsAg: Negative (10/31 1111)  HIV: Non Reactive (03/06 1038)  JXB:JYNWGNFA/-- (04/23  0850)  ENCOUNTER LABS   Results for orders placed or performed during the hospital encounter of 05/15/23 (from the past 24 hours)  CBC     Status: Abnormal   Collection Time: 05/15/23  9:44 PM  Result Value Ref Range   WBC 11.7 (H) 4.0 - 10.5 K/uL   RBC 3.52 (L) 3.87 - 5.11 MIL/uL   Hemoglobin 11.2 (L) 12.0 - 15.0 g/dL   HCT 21.3 (L) 08.6 - 57.8 %   MCV 92.6 80.0 - 100.0 fL   MCH 31.8 26.0 - 34.0 pg   MCHC 34.4 30.0 - 36.0 g/dL   RDW 46.9 62.9 - 52.8 %   Platelets 348 150 - 400 K/uL   nRBC 0.0 0.0 - 0.2 %  Type and screen     Status: None (Preliminary result)  Collection Time: 05/15/23  9:44 PM  Result Value Ref Range   ABO/RH(D) PENDING    Antibody Screen PENDING    Sample Expiration      05/18/2023,2359 Performed at [facility], [address] [place] [place], [place] [address]   Comprehensive metabolic panel     Status: Abnormal   Collection Time: 05/15/23  9:44 PM  Result Value Ref Range   Sodium 133 (L) 135 - 145 mmol/L   Potassium 3.2 (L) 3.5 - 5.1 mmol/L   Chloride 105 98 - 111 mmol/L   CO2 17 (L) 22 - 32 mmol/L   Glucose, Bld 97 70 - 99 mg/dL   BUN 10 6 - 20 mg/dL   Creatinine, Ser 6.04 0.44 - 1.00 mg/dL   Calcium  8.9 8.9 - 10.3 mg/dL   Total Protein 6.9 6.5 - 8.1 g/dL   Albumin 3.1 (L) 3.5 - 5.0 g/dL   AST 21 15 - 41 U/L   ALT 14 0 - 44 U/L   Alkaline Phosphatase 156 (H) 38 - 126 U/L   Total Bilirubin 0.5 0.0 - 1.2 mg/dL   GFR, Estimated >54 >09 mL/min   Anion gap 11 5 - 15  Protein / creatinine ratio, urine     Status: None   Collection Time: 05/15/23  9:44 PM  Result Value Ref Range   Creatinine, Urine 99 mg/dL   Total Protein, Urine 10 mg/dL   Protein Creatinine Ratio 0.10 0.00 - 0.15 mg/mg[Cre]        ASSESSMENT AND PLAN   Gianna Cranshaw Janssen is a 28 y.o. W1X9147 at [redacted]w[redacted]d with EDD: 05/24/2023, by Last Menstrual Period admitted for induction of labor due to Non-reactive NST.   Cook catheter placed for ripening. Pitocin  with catheter out or in  am.  GDM - Began checking sugars 3 days ago-fastings 93/97/103; 2 hour postprandial after breakfast today 117 - Poorly controlled based on limited results & AFI today of 23cm.  Fetal Status: - cephalic presentation by ultrasound today, Leopolds on admission - EFW: 7.5lb by Leopolds - CEFM - FHT currently cat II  GBS positive - Penicillin  for prophylaxis, has nausea as reaction to Amoxicillin , not a contraindication for penicillin   Hx HSV - No lesions, no prodromal symptoms, no outbreaks in pregnancy  Marijuana use in pregnancy - UDS +MJ at NOB - UDS at admission, notify pediatric team  Labs/Immunizations: TDAP: administered prenatally 03/20/23 Flu: no Rubella: immune Varicella: immune HIV: NR Hep B: Negative Hep C: NR RPR: NR GBS: positive  Lab Results  Component Value Date   VZVIGG Reactive 11/14/2022   HIV Non Reactive 03/20/2023   HIV NON REACTIVE 02/08/2018     Postpartum Plan: - Feeding: Breast Milk - Contraception: plans undecided - Prenatal Care Provider: A[facility]  Attending Dr. Madelene Schanz was immediately available for the care of the patient.   Future Appointments  Date Time Provider Department Center  05/23/2023 10:35 AM Forestine Igo, CNM [facility] None

## 2023-05-15 NOTE — Patient Instructions (Signed)

## 2023-05-15 NOTE — OB Triage Note (Signed)
 Pt being discharged home with plan to return this evening for an IOL. Pt agreed to plan discussed w Irine Manning, CNM. Going home stable and ambulatory. Discharge instructions reviewed and pt verbalized understanding; discharge packet given to patient. Repeat vitals wnl.

## 2023-05-15 NOTE — Progress Notes (Signed)
 ROB doing well, Denies concerns today. Pt states she just recently got blood sugar supplies. She has only checked her sugar 4 times.   She has done 3 fastings 93,97, 103.  2 hr after breakfast 117. Pt encouraged to continue monitoring and to bring in  her log next visit.   She is feeling good movement. SVE per pt request 2/50/ ballotable. Vertex  NST: category 2 tracing, 1 15 x 15 acceleration , 1 10 x 10 acceleration has minimal variabity at times with 3 spontaneous declarations . Consult Dr. Dell Fennel. Pt to go to L&D for BPP.   U/s for growth ordered.  Labor precautions reviewed. Follow up 1 week.   Alise Appl, CNM  Merriel Kendrick Litz 1995-02-10 [redacted]w[redacted]d  Fetus A Non-Stress Test Interpretation for 05/15/23  Indication: Diabetes, A2   Fetal Heart Rate A Mode: External Baseline Rate (A): 150 bpm Variability: Minimal Accelerations: 10 x 10, 15 x 15 (1-10x10, 1-15x15) Decelerations:  (Sporatic Decelerations) Scalp Stimulation: Negative Multiple birth?: No  Uterine Activity Mode: Toco Contraction Frequency (min): UI  Interpretation (Fetal Testing) Nonstress Test Interpretation: Non-reactive Overall Impression: Non-reassuring

## 2023-05-15 NOTE — Progress Notes (Signed)
Patient wheeled down to ultrasound.

## 2023-05-16 DIAGNOSIS — Z3A38 38 weeks gestation of pregnancy: Secondary | ICD-10-CM | POA: Diagnosis not present

## 2023-05-16 DIAGNOSIS — O409XX0 Polyhydramnios, unspecified trimester, not applicable or unspecified: Secondary | ICD-10-CM | POA: Insufficient documentation

## 2023-05-16 DIAGNOSIS — O368330 Maternal care for abnormalities of the fetal heart rate or rhythm, third trimester, not applicable or unspecified: Secondary | ICD-10-CM | POA: Diagnosis not present

## 2023-05-16 LAB — GLUCOSE, CAPILLARY
Glucose-Capillary: 101 mg/dL — ABNORMAL HIGH (ref 70–99)
Glucose-Capillary: 118 mg/dL — ABNORMAL HIGH (ref 70–99)
Glucose-Capillary: 83 mg/dL (ref 70–99)
Glucose-Capillary: 87 mg/dL (ref 70–99)
Glucose-Capillary: 95 mg/dL (ref 70–99)
Glucose-Capillary: 99 mg/dL (ref 70–99)

## 2023-05-16 LAB — RPR: RPR Ser Ql: NONREACTIVE

## 2023-05-16 MED ORDER — OXYTOCIN 10 UNIT/ML IJ SOLN
INTRAMUSCULAR | Status: DC
Start: 2023-05-16 — End: 2023-05-16
  Filled 2023-05-16: qty 2

## 2023-05-16 MED ORDER — MISOPROSTOL 200 MCG PO TABS
ORAL_TABLET | ORAL | Status: AC
Start: 2023-05-16 — End: 2023-05-16
  Filled 2023-05-16: qty 4

## 2023-05-16 MED ORDER — AMMONIA AROMATIC IN INHA
RESPIRATORY_TRACT | Status: DC
Start: 2023-05-16 — End: 2023-05-16
  Filled 2023-05-16: qty 10

## 2023-05-16 MED ORDER — FENTANYL CITRATE (PF) 100 MCG/2ML IJ SOLN
50.0000 ug | INTRAMUSCULAR | Status: DC | PRN
  Administered 2023-05-16: 50 ug via INTRAVENOUS
  Filled 2023-05-16: qty 2

## 2023-05-16 MED ORDER — FENTANYL CITRATE (PF) 100 MCG/2ML IJ SOLN: 100.0000 ug | INTRAMUSCULAR | Status: DC | PRN

## 2023-05-16 MED ORDER — PROPRANOLOL HCL 1 MG/ML IV SOLN
2.0000 mg | Freq: Once | INTRAVENOUS | Status: AC
  Administered 2023-05-16: 2 mg via INTRAVENOUS
  Filled 2023-05-16: qty 2

## 2023-05-16 NOTE — Progress Notes (Signed)
 Labor Progress Note   ASSESSMENT/PLAN   Michele Bailey 28 y.o.   [id number]  at [redacted]w[redacted]d here for IOL.  FWB:  - Fetal well being assessed: Category 2        GBS: - GBS positive. Adequately treated  LABOR: -  Latent labor, doing well. - Continue to titrate Pitocin  - Encouraged upright positions, birth ball, squats - Pain Management:  plans epidural - Anticipate SVD   Labor Progress   Principal Problem:   Labor and delivery indication for care or intervention Active Problems:   Gestational diabetes   Polyhydramnios affecting pregnancy     Overview: MVP 9.4, AFI 23   SUBJECTIVE/OBJECTIVE   SUBJECTIVE:  Cook catheter has been expelled. Michele Bailey still feels her contractions are mild.   OBJECTIVE: Vital Signs: Patient Vitals for the past 12 hrs:  BP Temp Temp src Pulse Resp  05/16/23 1718 129/85 98.7 F (37.1 C) Oral 86 --  05/16/23 1312 (!) 140/89 98.5 F (36.9 C) Oral (!) 108 --  05/16/23 0720 125/83 98.2 F (36.8 C) Oral 89 15    Last SVE:  Dilation: 5 Effacement (%): Thick Cervical Position: Posterior Exam by:: L Elks RNC   FHR:   - Baseline: 135 - Variability: minimal - Accels: present - Decels: none Category 2  UTERINE ACTIVITY:  Contractions: q 2-3 min  Phylliss Brenner, CNM

## 2023-05-16 NOTE — Progress Notes (Signed)
 Labor Progress Note   ASSESSMENT/PLAN   Michele Bailey 28 y.o.   [id number]  at [redacted]w[redacted]d here for IOL for NRNST and GDM.  FWB:  - Fetal well being assessed: Category 2        GBS: - GBS positive, continuing to receive PCN  LABOR: -  Latent labor, coping well. - Consulted with Michele Bailey d/t minimal cervical change, frequent mild ctx. FHR overall reassuring with one deep variable decel. She recommends placing a Cook catheter again and taking a 30 minute Pitocin  break. Michele Bailey is in agreement with this plan. - Pain Management:  Plans epidural - Anticipate SVD   Labor Progress   Principal Problem:   Labor and delivery indication for care or intervention Active Problems:   Gestational diabetes   Polyhydramnios affecting pregnancy     Overview: MVP 9.4, AFI 23   SUBJECTIVE/OBJECTIVE   SUBJECTIVE:     OBJECTIVE: Vital Signs: Patient Vitals for the past 12 hrs:  BP Temp Temp src Pulse  05/16/23 1908 133/85 98.8 F (37.1 C) Oral 93  05/16/23 1718 129/85 98.7 F (37.1 C) Oral 86  05/16/23 1312 (!) 140/89 98.5 F (36.9 C) Oral (!) 108    Last SVE:  Dilation: 5 Effacement (%): Thick Cervical Position: Posterior Exam by:: Michele Bailey RNC -  ,  ,  ,    FHR:   - Baseline: - Variability:  - Accels: - Decels: Category  UTERINE ACTIVITY:  Contractions:  Michele Bailey, CNM

## 2023-05-16 NOTE — Progress Notes (Signed)
 Labor Progress Note   ASSESSMENT/PLAN   Michele Bailey 28 y.o.   [id number]  at [redacted]w[redacted]d here for IOL for NRNST and GDM.  FWB:  - Fetal well being assessed: Category I/II        GBS: - GBS positive, adequately treated  LABOR: -  Cervix unchanged from prior exam - Discussed options with Michele Bailey and will start low-dose Pitocin  for cervical ripening - Pain Management:  Plans epidural - Anticipate SVD   Labor Progress   Principal Problem:   Labor and delivery indication for care or intervention Active Problems:   Gestational diabetes   Polyhydramnios affecting pregnancy     Overview: MVP 9.4, AFI 23   SUBJECTIVE/OBJECTIVE   SUBJECTIVE:  Michele Bailey's Cook catheter was expelled earlier this morning. She is not feeling any contractions.   OBJECTIVE: Vital Signs: Patient Vitals for the past 12 hrs:  BP Temp Temp src Pulse Resp Height Weight  05/16/23 0720 125/83 98.2 F (36.8 C) Oral 89 15 -- --  05/16/23 0600 105/67 98.3 F (36.8 C) Oral 98 -- -- --  05/16/23 0231 (!) 114/55 98.1 F (36.7 C) Oral (!) 107 -- -- --  05/15/23 2203 133/89 98.2 F (36.8 C) Oral 85 16 5\' 4"  (1.626 m) 101 kg    Last SVE:  Dilation: 2 Effacement (%): 50 Cervical Position: Posterior Exam by:: Gerald Kuehl CNM   FHR:   - Baseline: 140 - Variability: minimal/moderate - Accels: present - Decels: none  Category I/II  UTERINE ACTIVITY:  Contractions: irregular, mild  Phylliss Brenner, CNM

## 2023-05-16 NOTE — Inpatient Diabetes Management (Signed)
 Inpatient Diabetes Program Recommendations  Diabetes Treatment Program Recommendations  ADA Standards of Care Diabetes in Pregnancy Target Glucose Ranges:  Fasting: 70 - 95 mg/dL 1 hr postprandial: Less than 140mg /dL (from first bite of meal) 2 hr postprandial: Less than 120 mg/dL (from first bite of meal)     Latest Reference Range & Units 05/16/23 03:45 05/16/23 07:18  Glucose-Capillary 70 - 99 mg/dL 99 87   Review of Glycemic Control  Diabetes history: GDM; [redacted]W[redacted]D Outpatient Diabetes medications: None Current orders for Inpatient glycemic control: CBGs Q4H  NOTE: Patient admitted for induction of labor. CBGs 87-99 mg/dl since arrival. Agree with CBG Q4H. May want to order Novolog 0-14 units Q4H. Following delivery, anticipate patient will not need any medications for glycemic control.  Thanks, Beacher Limerick, RN, MSN, CDCES Diabetes Coordinator Inpatient Diabetes Program [phone] (Team Pager from 8am to 5pm)

## 2023-05-16 NOTE — Progress Notes (Signed)
 Labor Progress Note   ASSESSMENT/PLAN   Michele Bailey 28 y.o.   [id number]  at [redacted]w[redacted]d here with IOL for NRNST.  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 - Pain Management: plans epidural - Discussed options with patient and will eat breakfast then start pitocin  - Anticipate SVD   Principal Problem:   Labor and delivery indication for care or intervention Active Problems:   Gestational diabetes   Polyhydramnios affecting pregnancy     Overview: MVP 9.4, AFI 23   SUBJECTIVE/OBJECTIVE   SUBJECTIVE: Got some rest overnight, has ordered breakfast    OBJECTIVE: Vital Signs: Patient Vitals for the past 12 hrs:  BP Temp Temp src Pulse Resp Height Weight  05/16/23 0720 125/83 98.2 F (36.8 C) Oral 89 15 -- --  05/16/23 0600 105/67 98.3 F (36.8 C) Oral 98 -- -- --  05/16/23 0231 (!) 114/55 98.1 F (36.7 C) Oral (!) 107 -- -- --  05/15/23 2203 133/89 98.2 F (36.8 C) Oral 85 16 5\' 4"  (1.626 m) 101 kg    Last SVE:  Dilation: 2 Effacement (%): 50 Exam by:: Marney Treloar CNM -  ,  ,  ,    FHR:   - Mode: External  - Baseline Rate (A): 135 bpm  -  variability: moderate, periods of minimal  - Characteristics (ie - accels, decels): Accelerations: 10 x 10  -    UTERINE ACTIVITY:   - Mode: Toco  - Contraction Frequency (min): None noted minutes

## 2023-05-16 NOTE — Progress Notes (Signed)
 Labor Progress Note   ASSESSMENT/PLAN   Michele Bailey 28 y.o.   [id number]  at [redacted]w[redacted]d here for IOL for NRNST and GDM.  FWB:  - Fetal well being assessed: Category I/II (periods of minimal variability, isolated decel)        GBS: - GBS positive, adequately treated. Continue PCN.  LABOR: -  Cervical ripening, not in labor. Coping well. - Cervix is still quite posterior and fetal head is ballotable. - Discussed options with Michele Bailey and will place another Cook catheter - Continue Pitocin  titration - Pain Management: Labor support without medications and plans epidural -Continue q4h blood sugars, Endotool if indicated - Anticipate SVD   Labor Progress 2238: Cook catheter placed 0721: Cook catheter expelled, 2/50/high 0909: 2/50/high 1322: 2/50/high. Cook catheter placed. Pitocin  at 10 mU/min  Principal Problem:   Labor and delivery indication for care or intervention Active Problems:   Gestational diabetes   Polyhydramnios affecting pregnancy     Overview: MVP 9.4, AFI 23   SUBJECTIVE/OBJECTIVE   SUBJECTIVE:  Michele Bailey is feeling mild contractions. She is in good spirits.   OBJECTIVE: Vital Signs: Patient Vitals for the past 12 hrs:  BP Temp Temp src Pulse Resp  05/16/23 1312 (!) 140/89 98.5 F (36.9 C) Oral (!) 108 --  05/16/23 0720 125/83 98.2 F (36.8 C) Oral 89 15  05/16/23 0600 105/67 98.3 F (36.8 C) Oral 98 --  05/16/23 0231 (!) 114/55 98.1 F (36.7 C) Oral (!) 107 --    Last SVE:  Dilation: 2 Effacement (%): 50 Cervical Position: Posterior Exam by:: Michele Bailey CNM   FHR:   - Baseline: 145 - Variability: minimal/moderate - Accels: present - Decels: isolated decel, unable to classify Category I/II  UTERINE ACTIVITY:  Contractions: q 2-3 minutes  Michele Bailey, CNM

## 2023-05-17 ENCOUNTER — Inpatient Hospital Stay: Admitting: Anesthesiology

## 2023-05-17 ENCOUNTER — Encounter: Payer: Self-pay | Admitting: Obstetrics

## 2023-05-17 ENCOUNTER — Encounter
Admission: EM | Disposition: A | Discharge status: Home / Self Care | Payer: Self-pay | Source: Home / Self Care | Attending: Certified Nurse Midwife

## 2023-05-17 ENCOUNTER — Other Ambulatory Visit: Payer: Self-pay

## 2023-05-17 DIAGNOSIS — O99820 Streptococcus B carrier state complicating pregnancy: Secondary | ICD-10-CM

## 2023-05-17 DIAGNOSIS — O99213 Obesity complicating pregnancy, third trimester: Secondary | ICD-10-CM | POA: Diagnosis not present

## 2023-05-17 DIAGNOSIS — F1290 Cannabis use, unspecified, uncomplicated: Secondary | ICD-10-CM

## 2023-05-17 DIAGNOSIS — O24424 Gestational diabetes mellitus in childbirth, insulin controlled: Secondary | ICD-10-CM

## 2023-05-17 DIAGNOSIS — O99324 Drug use complicating childbirth: Secondary | ICD-10-CM

## 2023-05-17 DIAGNOSIS — O368330 Maternal care for abnormalities of the fetal heart rate or rhythm, third trimester, not applicable or unspecified: Secondary | ICD-10-CM | POA: Diagnosis not present

## 2023-05-17 DIAGNOSIS — O403XX0 Polyhydramnios, third trimester, not applicable or unspecified: Secondary | ICD-10-CM

## 2023-05-17 DIAGNOSIS — O368390 Maternal care for abnormalities of the fetal heart rate or rhythm, unspecified trimester, not applicable or unspecified: Secondary | ICD-10-CM

## 2023-05-17 DIAGNOSIS — Z3A39 39 weeks gestation of pregnancy: Secondary | ICD-10-CM

## 2023-05-17 DIAGNOSIS — O09213 Supervision of pregnancy with history of pre-term labor, third trimester: Secondary | ICD-10-CM

## 2023-05-17 LAB — GLUCOSE, CAPILLARY
Glucose-Capillary: 116 mg/dL — ABNORMAL HIGH (ref 70–99)
Glucose-Capillary: 94 mg/dL (ref 70–99)

## 2023-05-17 SURGERY — Surgical Case

## 2023-05-17 SURGERY — Surgical Case
Anesthesia: Spinal

## 2023-05-17 MED ORDER — KETOROLAC TROMETHAMINE 30 MG/ML IJ SOLN
INTRAMUSCULAR | Status: DC | PRN
  Administered 2023-05-17: 30 mg via INTRAVENOUS

## 2023-05-17 MED ORDER — SODIUM CHLORIDE 0.9 % IV SOLN
500.0000 mg | INTRAVENOUS | Status: DC
  Administered 2023-05-17: 500 mg via INTRAVENOUS
  Filled 2023-05-17: qty 5

## 2023-05-17 MED ORDER — PRENATAL MULTIVITAMIN CH
1.0000 | ORAL_TABLET | Freq: Every day | ORAL | Status: DC
Start: 2023-05-18 — End: 2023-05-19
  Administered 2023-05-18 – 2023-05-19 (×2): 1 via ORAL
  Filled 2023-05-17 (×2): qty 1

## 2023-05-17 MED ORDER — BUPIVACAINE HCL (PF) 0.5 % IJ SOLN
INTRAMUSCULAR | Status: AC
Start: 2023-05-17 — End: 2023-05-17
  Filled 2023-05-17: qty 30

## 2023-05-17 MED ORDER — LACTATED RINGERS IV SOLN
500.0000 mL | Freq: Once | INTRAVENOUS | Status: AC
  Administered 2023-05-17: 500 mL via INTRAVENOUS

## 2023-05-17 MED ORDER — OXYTOCIN-SODIUM CHLORIDE 30-0.9 UT/500ML-% IV SOLN
2.5000 [IU]/h | INTRAVENOUS | Status: AC
  Administered 2023-05-17: 2.5 [IU]/h via INTRAVENOUS
  Filled 2023-05-17: qty 500

## 2023-05-17 MED ORDER — WITCH HAZEL-GLYCERIN EX PADS: 1.0000 | MEDICATED_PAD | CUTANEOUS | Status: DC | PRN

## 2023-05-17 MED ORDER — FENTANYL CITRATE (PF) 100 MCG/2ML IJ SOLN
INTRAMUSCULAR | Status: AC
  Filled 2023-05-17: qty 2

## 2023-05-17 MED ORDER — CHLORHEXIDINE GLUCONATE 0.12 % MT SOLN
OROMUCOSAL | Status: AC
  Administered 2023-05-17: 15 mL
  Filled 2023-05-17: qty 15

## 2023-05-17 MED ORDER — EPHEDRINE 5 MG/ML INJ: 10.0000 mg | INTRAVENOUS | Status: DC | PRN

## 2023-05-17 MED ORDER — CEFAZOLIN SODIUM-DEXTROSE 2-4 GM/100ML-% IV SOLN
2.0000 g | Freq: Once | INTRAVENOUS | Status: AC
  Administered 2023-05-17: 2 g via INTRAVENOUS

## 2023-05-17 MED ORDER — FENTANYL-BUPIVACAINE-NACL 0.5-0.125-0.9 MG/250ML-% EP SOLN
12.0000 mL/h | EPIDURAL | Status: DC | PRN
  Administered 2023-05-17: 12 mL/h via EPIDURAL

## 2023-05-17 MED ORDER — MORPHINE SULFATE (PF) 0.5 MG/ML IJ SOLN
INTRAMUSCULAR | Status: DC | PRN
  Administered 2023-05-17: .1 mg via INTRATHECAL

## 2023-05-17 MED ORDER — SIMETHICONE 80 MG PO CHEW
80.0000 mg | CHEWABLE_TABLET | Freq: Three times a day (TID) | ORAL | Status: DC
  Administered 2023-05-17 – 2023-05-19 (×5): 80 mg via ORAL
  Filled 2023-05-17 (×5): qty 1

## 2023-05-17 MED ORDER — LIDOCAINE-EPINEPHRINE (PF) 1.5 %-1:200000 IJ SOLN
INTRAMUSCULAR | Status: DC | PRN
  Administered 2023-05-17: 5 mL via PERINEURAL

## 2023-05-17 MED ORDER — ACETAMINOPHEN 500 MG PO TABS
1000.0000 mg | ORAL_TABLET | Freq: Four times a day (QID) | ORAL | Status: DC
  Administered 2023-05-17 – 2023-05-19 (×7): 1000 mg via ORAL
  Filled 2023-05-17 (×8): qty 2

## 2023-05-17 MED ORDER — PHENYLEPHRINE 80 MCG/ML (10ML) SYRINGE FOR IV PUSH (FOR BLOOD PRESSURE SUPPORT): 80.0000 ug | PREFILLED_SYRINGE | INTRAVENOUS | Status: DC | PRN

## 2023-05-17 MED ORDER — SENNOSIDES-DOCUSATE SODIUM 8.6-50 MG PO TABS
2.0000 | ORAL_TABLET | Freq: Every day | ORAL | Status: DC
Start: 2023-05-18 — End: 2023-05-19
  Administered 2023-05-18 – 2023-05-19 (×2): 2 via ORAL
  Filled 2023-05-17 (×2): qty 2

## 2023-05-17 MED ORDER — IBUPROFEN 600 MG PO TABS
600.0000 mg | ORAL_TABLET | Freq: Four times a day (QID) | ORAL | Status: DC
  Administered 2023-05-18 – 2023-05-19 (×3): 600 mg via ORAL
  Filled 2023-05-17 (×4): qty 1

## 2023-05-17 MED ORDER — OXYTOCIN-SODIUM CHLORIDE 30-0.9 UT/500ML-% IV SOLN
INTRAVENOUS | Status: AC
  Filled 2023-05-17: qty 500

## 2023-05-17 MED ORDER — LACTATED RINGERS IV SOLN
INTRAVENOUS | Status: DC
  Administered 2023-05-17: 300 mL via INTRAUTERINE

## 2023-05-17 MED ORDER — DIPHENHYDRAMINE HCL 25 MG PO CAPS
25.0000 mg | ORAL_CAPSULE | Freq: Four times a day (QID) | ORAL | Status: DC | PRN
  Administered 2023-05-17: 25 mg via ORAL
  Filled 2023-05-17: qty 1

## 2023-05-17 MED ORDER — BUPIVACAINE HCL (PF) 0.25 % IJ SOLN
INTRAMUSCULAR | Status: DC | PRN
  Administered 2023-05-17 (×2): 5 mL via EPIDURAL

## 2023-05-17 MED ORDER — MENTHOL 3 MG MT LOZG: 1.0000 | LOZENGE | OROMUCOSAL | Status: DC | PRN

## 2023-05-17 MED ORDER — KETOROLAC TROMETHAMINE 30 MG/ML IJ SOLN
INTRAMUSCULAR | Status: AC
  Filled 2023-05-17: qty 1

## 2023-05-17 MED ORDER — BUPIVACAINE ON-Q PAIN PUMP (FOR ORDER SET NO CHG)
INJECTION | Status: DC
  Filled 2023-05-17 (×3): qty 1

## 2023-05-17 MED ORDER — MORPHINE SULFATE (PF) 0.5 MG/ML IJ SOLN
INTRAMUSCULAR | Status: AC
  Filled 2023-05-17: qty 10

## 2023-05-17 MED ORDER — BUPIVACAINE HCL 0.5 % IJ SOLN
INTRAMUSCULAR | Status: DC | PRN
  Administered 2023-05-17: 10 mL

## 2023-05-17 MED ORDER — LACTATED RINGERS IV SOLN: INTRAVENOUS | Status: DC

## 2023-05-17 MED ORDER — BUPIVACAINE HCL (PF) 0.75 % IJ SOLN
INTRAMUSCULAR | Status: DC | PRN
  Administered 2023-05-17: 1.2 mL

## 2023-05-17 MED ORDER — SOD CITRATE-CITRIC ACID 500-334 MG/5ML PO SOLN
ORAL | Status: AC
  Filled 2023-05-17: qty 15

## 2023-05-17 MED ORDER — FENTANYL CITRATE (PF) 100 MCG/2ML IJ SOLN
INTRAMUSCULAR | Status: DC | PRN
  Administered 2023-05-17: 15 ug via INTRATHECAL

## 2023-05-17 MED ORDER — FENTANYL-BUPIVACAINE-NACL 0.5-0.125-0.9 MG/250ML-% EP SOLN
EPIDURAL | Status: AC
Start: 2023-05-17 — End: 2023-05-17
  Filled 2023-05-17: qty 250

## 2023-05-17 MED ORDER — ONDANSETRON HCL 4 MG/2ML IJ SOLN
INTRAMUSCULAR | Status: DC | PRN
  Administered 2023-05-17: 4 mg via INTRAVENOUS

## 2023-05-17 MED ORDER — COCONUT OIL OIL
1.0000 | TOPICAL_OIL | Status: DC | PRN
  Administered 2023-05-18: 1 via TOPICAL
  Filled 2023-05-17: qty 7.5

## 2023-05-17 MED ORDER — MISOPROSTOL 200 MCG PO TABS
800.0000 ug | ORAL_TABLET | Freq: Once | ORAL | Status: AC
  Administered 2023-05-17: 800 ug via RECTAL

## 2023-05-17 MED ORDER — SIMETHICONE 80 MG PO CHEW: 80.0000 mg | CHEWABLE_TABLET | ORAL | Status: DC | PRN

## 2023-05-17 MED ORDER — DIPHENHYDRAMINE HCL 50 MG/ML IJ SOLN: 12.5000 mg | INTRAMUSCULAR | Status: DC | PRN

## 2023-05-17 MED ORDER — LIDOCAINE 5 % EX PTCH
MEDICATED_PATCH | CUTANEOUS | Status: AC
  Filled 2023-05-17: qty 1

## 2023-05-17 MED ORDER — CEFAZOLIN SODIUM-DEXTROSE 2-4 GM/100ML-% IV SOLN
INTRAVENOUS | Status: AC
  Filled 2023-05-17: qty 100

## 2023-05-17 MED ORDER — LACTATED RINGERS IV BOLUS: 1000.0000 mL | Freq: Once | INTRAVENOUS | Status: DC

## 2023-05-17 MED ORDER — OXYCODONE HCL 5 MG PO TABS: 5.0000 mg | ORAL_TABLET | ORAL | Status: DC | PRN

## 2023-05-17 MED ORDER — KETOROLAC TROMETHAMINE 30 MG/ML IJ SOLN
30.0000 mg | Freq: Four times a day (QID) | INTRAMUSCULAR | Status: AC
  Administered 2023-05-17 – 2023-05-18 (×4): 30 mg via INTRAVENOUS
  Filled 2023-05-17 (×4): qty 1

## 2023-05-17 MED ORDER — PHENYLEPHRINE HCL-NACL 20-0.9 MG/250ML-% IV SOLN
INTRAVENOUS | Status: DC | PRN
  Administered 2023-05-17: 50 ug/min via INTRAVENOUS

## 2023-05-17 MED ORDER — DIBUCAINE (PERIANAL) 1 % EX OINT: 1.0000 | TOPICAL_OINTMENT | CUTANEOUS | Status: DC | PRN

## 2023-05-17 MED ORDER — LIDOCAINE HCL (PF) 1 % IJ SOLN
INTRAMUSCULAR | Status: DC | PRN
  Administered 2023-05-17: 3 mL

## 2023-05-17 SURGICAL SUPPLY — 29 items
BAG COUNTER SPONGE SURGICOUNT (BAG) ×1 IMPLANT
BENZOIN TINCTURE PRP APPL 2/3 (GAUZE/BANDAGES/DRESSINGS) IMPLANT
CATH KIT ON-Q SILVERSOAK 5 (CATHETERS) IMPLANT
CATH KIT ON-Q SILVERSOAK 5IN (CATHETERS) ×2 IMPLANT
CHLORAPREP W/TINT 26 (MISCELLANEOUS) ×2 IMPLANT
DERMABOND ADVANCED .7 DNX12 (GAUZE/BANDAGES/DRESSINGS) IMPLANT
DRESSING SURGICEL FIBRLLR 1X2 (HEMOSTASIS) IMPLANT
DRSG TELFA 3X8 NADH STRL (GAUZE/BANDAGES/DRESSINGS) ×1 IMPLANT
ELECTRODE REM PT RTRN 9FT ADLT (ELECTROSURGICAL) ×1 IMPLANT
GAUZE SPONGE 4X4 12PLY STRL (GAUZE/BANDAGES/DRESSINGS) ×1 IMPLANT
GLOVE BIO SURGEON STRL SZ 6 (GLOVE) ×1 IMPLANT
GLOVE BIOGEL PI IND STRL 6 (GLOVE) ×1 IMPLANT
GOWN STRL REUS W/ TWL LRG LVL3 (GOWN DISPOSABLE) ×2 IMPLANT
KIT TURNOVER KIT A (KITS) ×1 IMPLANT
MANIFOLD NEPTUNE II (INSTRUMENTS) ×1 IMPLANT
MAT PREVALON FULL STRYKER (MISCELLANEOUS) ×1 IMPLANT
NS IRRIG 1000ML POUR BTL (IV SOLUTION) ×1 IMPLANT
PACK C SECTION AR (MISCELLANEOUS) ×1 IMPLANT
PAD ABD DERMACEA PRESS 5X9 (GAUZE/BANDAGES/DRESSINGS) IMPLANT
PAD OB MATERNITY 11 LF (PERSONAL CARE ITEMS) ×1 IMPLANT
PAD PREP OB/GYN DISP 24X41 (PERSONAL CARE ITEMS) ×1 IMPLANT
SCRUB CHG 4% DYNA-HEX 4OZ (MISCELLANEOUS) ×1 IMPLANT
STRIP CLOSURE SKIN 1/2X4 (GAUZE/BANDAGES/DRESSINGS) IMPLANT
SUT MNCRL AB 4-0 PS2 18 (SUTURE) ×1 IMPLANT
SUT MON AB 3-0 SH 27 (SUTURE) IMPLANT
SUT VIC AB 0 CT1 36 (SUTURE) ×4 IMPLANT
SUT VIC AB 3-0 SH 27X BRD (SUTURE) ×1 IMPLANT
TRAP FLUID SMOKE EVACUATOR (MISCELLANEOUS) ×1 IMPLANT
WATER STERILE IRR 500ML POUR (IV SOLUTION) ×1 IMPLANT

## 2023-05-17 NOTE — Discharge Summary (Signed)
 Postpartum Discharge Summary  Date of Service updated***     Patient Name: Michele Bailey DOB: March 27, 1995 MRN: [id number]  Date of admission: 05/15/2023 Delivery date:05/17/2023 Delivering provider: Sofia Dunn Date of discharge: 05/17/2023  Admitting diagnosis: Labor and delivery indication for care or intervention [O75.9] Intrauterine pregnancy: [redacted]w[redacted]d     Secondary diagnosis:  Principal Problem:   Labor and delivery indication for care or intervention Active Problems:   Gestational diabetes   Polyhydramnios affecting pregnancy  Additional problems: ***    Discharge diagnosis: Term Pregnancy Delivered                                              Post partum procedures:{Postpartum procedures:23558} Augmentation: AROM and Pitocin  Complications: None  Hospital course: Induction of Labor With Cesarean Section   28 y.o. yo [phone] at [redacted]w[redacted]d was admitted to the hospital 05/15/2023 for induction of labor. Patient had a labor course significant for persistent category II tracing. The patient went for cesarean section due to Non-Reassuring FHR. Delivery details are as follows: Membrane Rupture Time/Date: 6:25 AM,05/17/2023  Delivery Method:C-Section, Low Transverse Operative Delivery:N/A Details of operation can be found in separate operative Note.  Patient had a postpartum course complicated by***. She is ambulating, tolerating a regular diet, passing flatus, and urinating well.  Patient is discharged home in stable condition on 05/17/23.      Newborn Data: Birth date:05/17/2023 Birth time:12:34 PM Gender:Female Living status:Living Apgars:8 ,9  Weight:2930 g                               Magnesium  Sulfate received: No BMZ received: No Rhophylac:N/A MMR:N/A T-DaP:Given prenatally Flu: No RSV Vaccine received: No Transfusion:{Transfusion received:30440034} Immunizations administered: Immunization History  Administered Date(s) Administered   DTaP 03/18/1995, 05/09/1995,  07/09/1995, 03/28/1996, 04/10/2000   HIB (PRP-OMP) 03/18/1995, 05/09/1995, 07/09/1995, 01/09/1996   HPV Quadrivalent 09/05/2006, 11/07/2006   Hepatitis B 1994-03-09, 02/18/1995, 08/22/1995   IPV 03/18/1995, 05/09/1995, 07/30/1996, 04/10/2000   Influenza,inj,Quad PF,6+ Mos 11/25/2017, 12/21/2021   Influenza-Unspecified 11/22/2003, 12/03/2004, 11/22/2005, 12/16/2006, 11/23/2007, 12/20/2008, 12/17/2010, 01/24/2012   MMR 01/09/1996, 04/10/2000   Tdap 09/05/2006, 05/31/2015, 12/24/2017, 03/20/2023   Varicella 07/30/1996, 08/27/2015    Physical exam  Vitals:   05/16/23 1718 05/16/23 1908 05/16/23 2329 05/17/23 0510  BP: 129/85 133/85 124/85 109/66  Pulse: 86 93 87 96  Resp:      Temp: 98.7 F (37.1 C) 98.8 F (37.1 C) 98.3 F (36.8 C) 98.5 F (36.9 C)  TempSrc: Oral Oral Oral Oral  Weight:      Height:       General: {Exam; general:21111117} Lochia: {Desc; appropriate/inappropriate:30686::"appropriate"} Uterine Fundus: {Desc; firm/soft:30687} Incision: {Exam; incision:21111123} DVT Evaluation: {Exam; dvt:2111122} Labs: Lab Results  Component Value Date   WBC 11.7 (H) 05/15/2023   HGB 11.2 (L) 05/15/2023   HCT 32.6 (L) 05/15/2023   MCV 92.6 05/15/2023   PLT 348 05/15/2023      Latest Ref Rng & Units 05/15/2023    9:44 PM  CMP  Glucose 70 - 99 mg/dL 97   BUN 6 - 20 mg/dL 10   Creatinine 1.30 - 1.00 mg/dL 8.65   Sodium 784 - 696 mmol/L 133   Potassium 3.5 - 5.1 mmol/L 3.2   Chloride 98 - 111 mmol/L 105   CO2 22 -  32 mmol/L 17   Calcium  8.9 - 10.3 mg/dL 8.9   Total Protein 6.5 - 8.1 g/dL 6.9   Total Bilirubin 0.0 - 1.2 mg/dL 0.5   Alkaline Phos 38 - 126 U/L 156   AST 15 - 41 U/L 21   ALT 0 - 44 U/L 14    Edinburgh Score:    10/25/2022    3:40 PM  Edinburgh Postnatal Depression Scale Screening Tool  I have been able to laugh and see the funny side of things. 0  I have looked forward with enjoyment to things. 0  I have blamed myself unnecessarily when things went  wrong. 0  I have been anxious or worried for no good reason. 0  I have felt scared or panicky for no good reason. 0  Things have been getting on top of me. 0  I have been so unhappy that I have had difficulty sleeping. 0  I have felt sad or miserable. 0  I have been so unhappy that I have been crying. 0  The thought of harming myself has occurred to me. 0  Edinburgh Postnatal Depression Scale Total 0      After visit meds:  Allergies as of 05/17/2023       Reactions   Amoxicillin -pot Clavulanate Nausea Only   Willing to take med if necessary with nausea med Willing to take med if necessary with nausea med  Willing to take med if necessary with nausea med     Med Rec must be completed prior to using this [facility]***        Discharge home in stable condition Infant Feeding: {Baby feeding:23562} Infant Disposition:{CHL IP OB HOME WITH MVHQIO:96295} Discharge instruction: per After Visit Summary and Postpartum booklet. Activity: Advance as tolerated. Pelvic rest for 6 weeks.  Diet: {OB diet:21111121} Anticipated Birth Control: {Birth Control:23956} Postpartum Appointment:{Outpatient follow up:23559} Additional Postpartum F/U: {PP Procedure:23957} Future Appointments: Future Appointments  Date Time Provider Department Center  05/23/2023 10:35 AM Forestine Igo, CNM [facility] None   Follow up Visit:      05/17/2023 Berkley Breech [facility], CNM

## 2023-05-17 NOTE — Anesthesia Preprocedure Evaluation (Signed)
 Anesthesia Evaluation  Patient identified by MRN, date of birth, ID band Patient awake    Reviewed: Allergy & Precautions, NPO status , Patient's Chart, lab work & pertinent test results  History of Anesthesia Complications Negative for: history of anesthetic complications  Airway Mallampati: II  TM Distance: >3 FB Neck ROM: full    Dental no notable dental hx.    Pulmonary neg pulmonary ROS, former smoker   Pulmonary exam normal        Cardiovascular Exercise Tolerance: Good negative cardio ROS Normal cardiovascular exam     Neuro/Psych    GI/Hepatic hiatal hernia,GERD  Medicated,,  Endo/Other  diabetes, Gestational    Renal/GU   negative genitourinary   Musculoskeletal   Abdominal   Peds  Hematology negative hematology ROS (+)   Anesthesia Other Findings Past Medical History: 02/04/2015: Acid reflux No date: Acid reflux No date: Anxiety 04/20/2021: Breast pain, right 12/21/2021: Chest pain 12/21/2021: Chronic cough 08/24/2021: Citrobacter infection 10/31/2020: Cystitis No date: Depression 10/31/2020: Elevated blood pressure reading in office without  diagnosis of hypertension 08/20/2021: Hair loss 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 No date: Hiatal hernia  Past Surgical History: No date: INDUCED ABORTION No date: WISDOM TOOTH EXTRACTION     Comment:  top two; age 28  BMI    Body Mass Index: 38.22 kg/m      Reproductive/Obstetrics (+) Pregnancy                              Anesthesia Physical Anesthesia Plan  ASA: 2  Anesthesia Plan: Epidural   Post-op Pain Management:    Induction:   PONV Risk Score and Plan:   Airway Management Planned: Natural Airway  Additional Equipment:   Intra-op Plan:   Post-operative Plan:   Informed Consent: I have reviewed the patients History and Physical, chart, labs and discussed the procedure including the  risks, benefits and alternatives for the proposed anesthesia with the patient or authorized representative who has indicated his/her understanding and acceptance.     Dental Advisory Given  Plan Discussed with: Anesthesiologist  Anesthesia Plan Comments: (Patient reports no bleeding problems and no anticoagulant use.   Patient consented for risks of anesthesia including but not limited to:  - adverse reactions to medications - risk of bleeding, infection and or nerve damage from epidural that could lead to paralysis - risk of headache or failed epidural - nerve damage due to positioning - that if epidural is used for C-section that there is a chance of epidural failure requiring spinal placement or conversion to GA - Damage to heart, brain, lungs, other parts of body or loss of life  Patient voiced understanding and assent.)         Anesthesia Quick Evaluation

## 2023-05-17 NOTE — Progress Notes (Signed)
 28 y.o. [id number] with GDM on insulin, polydramnios, and non-reactive NST in office, undergoing IOL.  Now s/p Cook's x 3, and pitocin  titration. Pt feeling some contractions, but not much. Unable to get pt into regular contraction pattern despite titrating pitocin  to 26u. SVE 5/60/-2, AROM with gentle fundal pressure with copious, clear fluid. Kept hand in place until fluid stopped flowing, pt started feeling stronger contractions, and fetal head well-applied, came down to -1 station. Continue active management, anticipate NSVD. Pt requesting epidural now, will notify anesthesia.

## 2023-05-17 NOTE — Progress Notes (Signed)
 Patient ID: Michele Bailey, female   DOB: 1995/10/06, 28 y.o.   MRN: [id number]  RN requested assessment at the bedside, RN consistently getting gulf ball sized clots with fundal rub.   BP 121/68 (BP Location: Left Arm)   Pulse 72   Temp 98.5 F (36.9 C) (Oral)   Resp 18   Ht 5\' 4"  (1.626 m)   Wt 101 kg   LMP 08/17/2022 (Exact Date)   SpO2 100%   Breastfeeding Unknown   BMI 38.22 kg/m   Pt alert in bed, pale  Fundus firm 1-2 below U  Lower uterine segment noted to be boggy with bimanual exam, multiple large clots expressed from uterus.   JADA inserted according to manufacturing instructions. Pt tolerated procedure well.   800mcg Cytotec  placed PR.   QBL in OR QBL w/ expression of clots   Total QBL now 1,074ml  Will reassess in 1 hour   Anice Kerbs, CNM  [facility]  05/17/2023 3:10 PM

## 2023-05-17 NOTE — Progress Notes (Signed)
 Labor Progress Note   ASSESSMENT/PLAN   Michele Bailey 28 y.o.   [id number]  at [redacted]w[redacted]d here for IOL for NRNST.  FWB:  - Fetal well being assessed: Category 2        GBS: - GBS positive, adequately treated  LABOR: -  Latent labor, doing well. - Pain Management:  plans epidural - Anticipate SVD    SUBJECTIVE/OBJECTIVE   SUBJECTIVE:  Feeling contractions a little more after AROM.   OBJECTIVE: Vital Signs: Patient Vitals for the past 12 hrs:  BP Temp Temp src Pulse  05/17/23 0510 109/66 98.5 F (36.9 C) Oral 96  05/16/23 2329 124/85 98.3 F (36.8 C) Oral 87  05/16/23 1908 133/85 98.8 F (37.1 C) Oral 93    Last SVE:  Dilation: 5 Effacement (%): 60 Cervical Position: Posterior Station: -2 Exam by:: Marga Share, MD -  , Rupture Date: (P) 05/17/23, Rupture Time: (P) 0625,    FHR:   - Baseline: 130 - Variability: minimal/moderate - Accels: present - Decels: early Category 2  UTERINE ACTIVITY:  Contractions: q2-4 min  Phylliss Brenner, CNM

## 2023-05-17 NOTE — Progress Notes (Signed)
 Michele Bailey is a 28 y.o. [phone] at [redacted]w[redacted]d by LMP admitted for induction of labor due to NRNST.  Subjective: Comfortable with epidural, Here partner is at the bedside. Discussed IUPC and amnioinfusion, pt agreeable.   Objective: BP 109/66   Pulse 96   Temp 98.5 F (36.9 C) (Oral)   Resp 15   Ht 5\' 4"  (1.626 m)   Wt 101 kg   LMP 08/17/2022 (Exact Date)   BMI 38.22 kg/m  No intake/output data recorded. No intake/output data recorded.  FHT: baseline 140, variability min to moderate, accels none, decels early or varibales UC:   irregular, every 1-4 minutes SVE:   Dilation: 6 Effacement (%): 60 Station: -3 Exam by:: Cinda Craze, CNM   Labs: Lab Results  Component Value Date   WBC 11.7 (H) 05/15/2023   HGB 11.2 (L) 05/15/2023   HCT 32.6 (L) 05/15/2023   MCV 92.6 05/15/2023   PLT 348 05/15/2023    Assessment / Plan: IOL secondary to NRNST   Labor: Progressing normally, IUPC placed, amnioinfusion ordered  GDM:  glucose 116 at 0347  Fetal Wellbeing:  Category II Pain Control:  Epidural I/D:   GSB negative, AROM at 0625 Anticipated MOD:  NSVD  Dr Dell Fennel called by Cinda Craze to review tracing.   Berkley Breech Arshawn Valdez, CNM 05/17/2023, 8:32 AM

## 2023-05-17 NOTE — Lactation Note (Signed)
 This note was copied from a baby's chart. Lactation Consultation Note  Patient Name: Michele Bailey Date: 05/17/2023 Age:29 hours Reason for consult: L&D Initial assessment;1st time breastfeeding;Term;Breastfeeding assistance   Maternal Data Has patient been taught Hand Expression?: Yes Does the patient have breastfeeding experience prior to this delivery?: Yes How long did the patient breastfeed?: 2-3 mths  Feeding Mother's Current Feeding Choice: Breast Milk  LATCH Score Latch: Grasps breast easily, tongue down, lips flanged, rhythmical sucking. (sucks on upper lip and tongue)  Audible Swallowing: A few with stimulation  Type of Nipple: Everted at rest and after stimulation  Comfort (Breast/Nipple): Soft / non-tender  Hold (Positioning): Assistance needed to correctly position infant at breast and maintain latch.  LATCH Score: 8   Lactation Tools Discussed/Used    Interventions Interventions: Breast feeding basics reviewed;Assisted with latch;Skin to skin;Hand express;Breast compression;Adjust position;Support pillows;Education  Discharge Pump: Personal;Manual WIC Program: Yes  Consult Status Consult Status: Follow-up from L&D Date: 05/17/23 Follow-up type: In-patient    Michele Bailey 05/17/2023, 2:27 PM

## 2023-05-17 NOTE — Anesthesia Procedure Notes (Signed)
 Epidural Patient location during procedure: OB Start time: 05/17/2023 7:45 AM End time: 05/17/2023 7:49 AM  Staffing Anesthesiologist: Nancey Awkward, MD Performed: anesthesiologist   Preanesthetic Checklist Completed: patient identified, IV checked, site marked, risks and benefits discussed, surgical consent, monitors and equipment checked, pre-op evaluation and timeout performed  Epidural Patient position: sitting Prep: ChloraPrep Patient monitoring: heart rate, continuous pulse ox and blood pressure Approach: midline Location: L3-L4 Injection technique: LOR saline  Needle:  Needle type: Tuohy  Needle gauge: 17 G Needle length: 9 cm and 9 Needle insertion depth: 7 cm Catheter type: closed end flexible Catheter size: 19 Gauge Catheter at skin depth: 12 cm Test dose: negative and 1.5% lidocaine  with Epi 1:200 K  Assessment Sensory level: T10 Events: blood not aspirated, injection not painful, no injection resistance, no paresthesia and negative IV test  Additional Notes 2 attempt Pt. Evaluated and documentation done after procedure finished. Patient identified. Risks/Benefits/Options discussed with patient including but not limited to bleeding, infection, nerve damage, paralysis, failed block, incomplete pain control, headache, blood pressure changes, nausea, vomiting, reactions to medication both or allergic, itching and postpartum back pain. Confirmed with bedside nurse the patient's most recent platelet count. Confirmed with patient that they are not currently taking any anticoagulation, have any bleeding history or any family history of bleeding disorders. Patient expressed understanding and wished to proceed. All questions were answered. Sterile technique was used throughout the entire procedure. Please see nursing notes for vital signs. Test dose was given through epidural catheter and negative prior to continuing to dose epidural or start infusion. Warning signs of high block  given to the patient including shortness of breath, tingling/numbness in hands, complete motor block, or any concerning symptoms with instructions to call for help. Patient was given instructions on fall risk and not to get out of bed. All questions and concerns addressed with instructions to call with any issues or inadequate analgesia.    Patient tolerated the insertion well without immediate complications. Reason for block:procedure for pain

## 2023-05-17 NOTE — Progress Notes (Signed)
 Patient: Michele Bailey MVH:[id number] DOB: 1995-08-03   Notified by CNM of minimal variability, intermittent variables not helped by amnioinfusion, and when attempting to turn pitocin  back on, instant late deceleration. Pt still 5-6cm on exam. Recommend proceeding with cesarean delivery. The risks of surgery were discussed with the patient including but were not limited to: bleeding which may require transfusion or reoperation; infection which may require antibiotics; injury to bowel, bladder, ureters or other surrounding organs; injury to the fetus; need for additional procedures including hysterectomy in the event of a life-threatening hemorrhage; formation of adhesions; placental abnormalities with subsequent pregnancies; incisional problems; thromboembolic phenomenon and other postoperative/anesthesia complications.  The patient concurred with the proposed plan, giving informed written consent for the procedure.   Patient has been NPO > 8hrs, she will remain NPO for procedure. Anesthesia and OR aware. Preoperative prophylactic antibiotics, including Azithromycin , and SCDs ordered on call to the OR.  To OR when ready.

## 2023-05-17 NOTE — Anesthesia Procedure Notes (Addendum)
 Spinal  Patient location during procedure: OR Start time: 05/17/2023 12:00 PM End time: 05/17/2023 12:03 PM Reason for block: surgical anesthesia Staffing Performed: resident/CRNA  Anesthesiologist: Nancey Awkward, MD Resident/CRNA: Manya Sells, CRNA Performed by: Manya Sells, CRNA Authorized by: Nancey Awkward, MD   Preanesthetic Checklist Completed: patient identified, IV checked, site marked, risks and benefits discussed, surgical consent, monitors and equipment checked, pre-op evaluation and timeout performed Spinal Block Patient position: sitting Prep: ChloraPrep Patient monitoring: heart rate, continuous pulse ox, blood pressure and cardiac monitor Approach: midline Location: L3-4 Injection technique: single-shot Needle Needle type: Whitacre and Introducer  Needle gauge: 24 G Needle length: 9 cm Assessment Sensory level: T4 Events: CSF return Additional Notes Sterile aseptic technique used throughout the procedure.  Negative paresthesia. Negative blood return. Positive free-flowing CSF. Expiration date of kit checked and confirmed. Patient tolerated procedure well, without complications.

## 2023-05-17 NOTE — Op Note (Signed)
 CESAREAN SECTION OPERATIVE REPORT   DATE OF SURGERY: 05/17/23  SURGEON: Sofia Dunn, DO ASSISTANT:  Lonzell Robin Dominic,CNM ANESTHESIA: Spinal  PROCEDURE: Primary low transverse cesarean section  PREOPERATIVE DIAGNOSES: 1. Intrauterine pregnancy at [redacted]w[redacted]d 2. Arrest of dilation at 6cm 3. Non-reassuring fetal heart tracing 4. 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 Polyhydramnios 6. Elevated BMI 7. GBS positive  POSTOPERATIVE DIAGNOSES: 1. Same, s/p pLTCS  QBL:   550cc DRAINS: foley catheter to gravity drainage, 200 ml of clear urine at end of the procedure TOTAL IV FLUIDS: SPECIMENS: Cord gases COMPLICATIONS:  None  FINDINGS:  Viable female infant in cephalic/OP presentation; APGARs 8/9; weight 2930 grams (6lbs, 7oz) Light, copious meconium-stained fluid at amniotomy Intact placenta with 3 vessel cord Uterus, tubes, and ovaries appeared normal  INDICATION and CONSENT: Michele Bailey is a 28 y.o. [phone] with IUP at [redacted]w[redacted]d presenting for cesarean after arrest of dilation at 6cm and NRFHT during IOL for A2GDM. The patient understood that the risks of cesarean section include, but are not limited to, visceral or vascular injury, infection, blood loss and need for transfusion, prolonged hospitalization, and reoperation.  The patient stated understanding and desired to proceed.  All questions were answered.  PROCEDURE:  After verbal and written informed consent was obtained, the patient was taken to the operating room where spinal anesthesia was found to be adequate.  SCDs were applied to the lower extremities and a Foley catheter was placed in the bladder under sterile technique.  The patient was placed in dorsal supine position with a leftward tilt, prepped and draped in a sterile fashion.  Two grams of Cefazolin and 500mg  of Azithromycin  were given for infection prophylaxis.  Level of anesthesia was confirmed to be adequate with Allis clamps.  A Pfannenstiel skin incision was made with the scalpel  and carried down to the underlying layer of rectus fascia.  The fascia was nicked bilaterally in the midline with the Bovie and the fascial incision was extended laterally using Mayo scissors.  The superior aspect of the fascia was grasped with Kocher clamps and the underlying rectus muscles were dissected off sharply with Mayo scissors and bluntly.  In a similar fashion, the inferior aspect of fascia was grasped with Kocher clamps and the underlying rectus and pyramidalis were dissected off sharply and bluntly.  The rectus muscles were separated in the midline bluntly.  The peritoneum was found to be free of adherent bowel and entered sharply.  The peritoneal incision was extended bluntly to the bladder reflection with good visualization of the bladder.  The Alexis retractor was inserted and vesicouterine peritoneum identified.  Intraabdomnial survey revealed scant, clear peritoneal fluid and a thinned-out lower uterine segment.  The lower uterine segment was incised transversely with the scalpel.  The amniotic sac was ruptured with an Allis clamp and copious light meconium-stained fluid noted for 750cc  The uterine incision was extended bluntly in a cranial-caudal fashion.  The fetus was in cephalic/OP presentation.  The head was flexed and elevated to the level of the uterine incision.  Gentle fundal pressure was applied by the assistant and the infant was delivered without difficulty.  Delayed cord clamping was performed for 60 seconds. The nose and mouth were suctioned with a bulb. The cord was doubly clamped and cut, with a cord segment obtained for gases.  The infant was handed to the awaiting NICU team.  The placenta delivered intact & spontaneously with manual massage of the uterine fundus. The uterus was left in situ.  The inside of the uterus was gently wiped with lap sponges x 2 ensuring complete removal of placental membranes.  The uterine incision was repaired with a double layer closure of  0-Vicryl first in a locking fashion, followed by 0-Vicryl in an imbricating stitch, with excellent hemostasis achieved.  The ovaries and tubes were found to be grossly normal.  Blood clots, debris and fluid were cleaned from the abdomen, gutters, and pelvis with moist laparotomy sponges.  The uterine incision was reinspected and was hemostatic.   The On-Q catheter pumps were inserted in accordance with the manufacturer's recommendations.  The catheters were inserted approximately 4cm cephelad to the incision line, approximately 1cm apart, straddling the midline.  They were inserted to a depth of the 4th mark. The first catheter was placed around the uterine fundus. The peritoneum was then closed with 3-0 Monocryl in a running fashion.  The second catheter was then positioned superficial to the rectus abdominus muscles and deep to the rectus fascia.    The superior and inferior fascia were grasped with Kocher clamps and the rectus muscles were examined and found to be hemostatic, ensured with Bovie electrocautery.  The fascial layer was closed with 0-Vicryl in a running fashion.  The subcutaneous tissue was irrigated, made hemostatic with Bovie electrocautery, then reapproximated with a running layer of 3-0 Plain. The skin was closed subcuticularly with 4-0 Vicryl on a keith and steristrips and a sterile pressure dressing.  The On-Q catheters were bolused with 5 mL of 0.5% marcaine  plain for a total of 10 mL.  The catheters were affixed to the skin with surgical skin glue, steri-strips, and tegaderm.  All sponge, lap and instrument counts were correct x 2. The patient tolerated the procedure well and was taken to the recovery room in stable condition.  An experienced assistant was required given the standard of surgical care given the complexity of the case.  This assistant was needed for exposure, dissection, suctioning, retraction, instrument exchange, and for overall help during the procedure.   Sofia Dunn, DO [facility]

## 2023-05-17 NOTE — Transfer of Care (Signed)
 Immediate Anesthesia Transfer of Care Note  Patient: Michele Bailey  Procedure(s) Performed: CESAREAN DELIVERY  Patient Location: PACU  Anesthesia Type:Spinal  Level of Consciousness: awake, alert , and oriented  Airway & Oxygen Therapy: Patient Spontanous Breathing  Post-op Assessment: Report given to RN and Post -op Vital signs reviewed and stable  Post vital signs: Reviewed and stable  Last Vitals:  Vitals Value Taken Time  BP 121/68 05/17/23 1341  Temp    Pulse 72 05/17/23 1341  Resp 18 05/17/23 1341  SpO2 100 % 05/17/23 1341    Last Pain:  Vitals:   05/17/23 1341  TempSrc:   PainSc: 0-No pain         Complications: No notable events documented.

## 2023-05-17 NOTE — Lactation Note (Addendum)
 This note was copied from a baby's chart. Lactation Consultation Note  Patient Name: Michele Bailey [id number] Date: 05/17/2023 Age:29 hours Reason for consult: Follow-up assessment;Term   Maternal Data Has patient been taught Hand Expression?: Yes Does the patient have breastfeeding experience prior to this delivery?: Yes How long did the patient breastfeed?: 2-3 mths  Feeding Mother's Current Feeding Choice: Breast Milk Assisted with latching baby to left breast, after hand expressing easily colostrum, baby sleepy but readily latched to breast and is nursing with some stimulation, nursed 15 min, blood sugar 60. LATCH Score Latch: Grasps breast easily, tongue down, lips flanged, rhythmical sucking.  Audible Swallowing: Spontaneous and intermittent  Type of Nipple: Everted at rest and after stimulation  Comfort (Breast/Nipple): Soft / non-tender  Hold (Positioning): Assistance needed to correctly position infant at breast and maintain latch.  LATCH Score: 9   Lactation Tools Discussed/Used    Interventions Interventions: Assisted with latch;Skin to skin;Hand express;Support pillows  Discharge Pump: Personal;Manual WIC Program: Yes  Consult Status Consult Status: Follow-up Date: 05/18/23 Follow-up type: In-patient    Michele Bailey 05/17/2023, 5:12 PM

## 2023-05-17 NOTE — Discharge Instructions (Signed)

## 2023-05-17 NOTE — Progress Notes (Signed)
 Labor Progress Note   ASSESSMENT/PLAN   Michele Bailey 28 y.o.   [name]  at [redacted]w[redacted]d here for NRNST and GDM.  FWB:  - Fetal well being assessed: Category I/II        GBS: - GBS positive, adequately treated  LABOR: -  Latent labor, coping well. - Cook catheter expelled - Cervix still posterior and high. Difficult to assess, about 3-4 cm but still thick. - Pain Management: Labor support without medications - Continue Pitocin  titration - Anticipate SVD    Principal Problem:   Labor and delivery indication for care or intervention Active Problems:   Gestational diabetes   Polyhydramnios affecting pregnancy     Overview: MVP 9.4, AFI 23   SUBJECTIVE/OBJECTIVE   SUBJECTIVE:  Michele Bailey is feeling some of her contractions are a little stronger. Her Cook catheter has been expelled. She is resting between contractions.   OBJECTIVE: Vital Signs: Patient Vitals for the past 12 hrs:  BP Temp Temp src Pulse  05/16/23 2329 124/85 98.3 F (36.8 C) Oral 87  05/16/23 1908 133/85 98.8 F (37.1 C) Oral 93  05/16/23 1718 129/85 98.7 F (37.1 C) Oral 86    Last SVE:  Dilation: 3 Effacement (%): 40 Cervical Position: Posterior Exam by:: Cinda Craze, CNM  FHR:   - Baseline: 130 - Variability:  min/mod - Accels: present - Decels: none Category I/II  UTERINE ACTIVITY:  Contractions: q 2-3 minutes  Phylliss Brenner, CNM

## 2023-05-17 NOTE — Progress Notes (Signed)
 Patient ID: Michele Bailey, female   DOB: 1995-06-12, 28 y.o.   MRN: [id number]  JADA inserted around 1500. Suction turned off around 1915. Scan amount of bleeding noted. JADA easily removed. FF M 1/U, scant bleeding.   Ok to transfer to mother baby   [facility]  05/17/2023 8:31 PM

## 2023-05-17 NOTE — Progress Notes (Signed)
 Patient ID: Michele Bailey, female   DOB: 03/02/1995, 28 y.o.   MRN: [id number] Reviewed tracing with Dr Dell Fennel. Minimal variability despite IVF bolus and position change.  Decel noted shortly after restarting pitocin .  Plan for c-section -Discussed with pt recommendation for c-section, pt currently on the phone with her partner to discuss.  -Dr Dell Fennel enroute  -Anesthesia notified.   Anice Kerbs, CNM  [facility]  05/17/2023 10:45 AM

## 2023-05-18 LAB — CBC
HCT: 21.4 % — ABNORMAL LOW (ref 36.0–46.0)
Hemoglobin: 7.3 g/dL — ABNORMAL LOW (ref 12.0–15.0)
MCH: 32.2 pg (ref 26.0–34.0)
MCHC: 34.1 g/dL (ref 30.0–36.0)
MCV: 94.3 fL (ref 80.0–100.0)
Platelets: 266 10*3/uL (ref 150–400)
RBC: 2.27 MIL/uL — ABNORMAL LOW (ref 3.87–5.11)
RDW: 13.6 % (ref 11.5–15.5)
WBC: 10.1 10*3/uL (ref 4.0–10.5)
nRBC: 0 % (ref 0.0–0.2)

## 2023-05-18 MED ORDER — FERROUS SULFATE 325 (65 FE) MG PO TABS
325.0000 mg | ORAL_TABLET | Freq: Three times a day (TID) | ORAL | Status: DC
  Administered 2023-05-18 – 2023-05-19 (×4): 325 mg via ORAL
  Filled 2023-05-18 (×4): qty 1

## 2023-05-18 NOTE — Anesthesia Postprocedure Evaluation (Signed)
 Anesthesia Post Note  Patient: Michele Bailey  Procedure(s) Performed: CESAREAN DELIVERY  Patient location during evaluation: Mother Baby Anesthesia Type: Spinal Level of consciousness: oriented and awake and alert Pain management: pain level controlled Vital Signs Assessment: post-procedure vital signs reviewed and stable Respiratory status: spontaneous breathing Cardiovascular status: blood pressure returned to baseline and stable Postop Assessment: no headache, no backache, no apparent nausea or vomiting, patient able to bend at knees and able to ambulate Anesthetic complications: no   No notable events documented.   Last Vitals:  Vitals:   05/18/23 0257 05/18/23 0300  BP: 122/75   Pulse: 97 (!) 102  Resp: 20   Temp: 36.8 C   SpO2: 97% 95%    Last Pain:  Vitals:   05/18/23 0257  TempSrc: Oral  PainSc:                  Portia Brittle Anabel Lykins

## 2023-05-18 NOTE — Anesthesia Post-op Follow-up Note (Signed)
  Anesthesia Pain Follow-up Note  Patient: Michele Bailey  Day #: 1  Date of Follow-up: 05/18/2023 Time: 7:58 AM  Last Vitals:  Vitals:   05/18/23 0257 05/18/23 0300  BP: 122/75   Pulse: 97 (!) 102  Resp: 20   Temp: 36.8 C   SpO2: 97% 95%    Level of Consciousness: alert  Pain: mild   Side Effects:None  Catheter Site Exam:clean, dry     Plan: D/C from anesthesia care at surgeon's request  Portia Brittle Brinsley Wence

## 2023-05-18 NOTE — Progress Notes (Signed)
 Patient ID: Michele Bailey, female   DOB: 08/31/1995, 28 y.o.   MRN: [id number]  Hbg 7.3, reviewed with Dr Dell Fennel, pt does not need blood transfusion at this time, can be treated with Iron  supplementation.    pt resting in bed, has gotten out of bed since delivery. Denies light headedness, SOB or palpitations. Discussed hemoglobin now low, recommend supplementation with IV or oral Iron . Pt prefers oral treatment. Reviewed if she were to become symptomatic she may need a blood transfusion, pt agreeable to plan.   Anice Kerbs, CNM  [facility]  05/18/2023  7:02 AM

## 2023-05-18 NOTE — Lactation Note (Signed)
 This note was copied from a baby's chart. Lactation Consultation Note  Patient Name: Michele Bailey Date: 05/18/2023 Age:29 hours Reason for consult: Follow-up assessment;Term   Maternal Data Follow up assessment w/ a 19hr old baby Michele and patient.  Patient stated that feedings are going well.  She did notice when she went to the bathroom that she had a blister on her rt nipple.  Other than that patient stated that she feels very comfortable with feedings.  Feeding Mother's Current Feeding Choice: Breast Milk  Lactation Tools Discussed/Used Tools: Comfort gels  LC supplied patient with 2 hydrogels and taught patient how to use them.   Interventions Interventions: Education  LC also discussed nipple care for the blister and to check in with her positioning of infant at the breast. Patient verbalized understanding.   Consult Status Consult Status: Follow-up Follow-up type: Call as needed    Marshay Slates S Annalyssa Thune 05/18/2023, 10:20 AM

## 2023-05-18 NOTE — Progress Notes (Signed)
 Progress Note - Cesarean Delivery  Michele Bailey is a 28 y.o. [phone] now PP day 1 s/p C-Section, Low Transverse.   Subjective:  Patient reports no problems with eating, bowel movements, voiding, or their wound    Objective:  Vital signs in last 24 hours: Temp:  [97.8 F (36.6 C)-98.6 F (37 C)] 97.8 F (36.6 C) (05/04 0800) Pulse Rate:  [72-107] 103 (05/04 0800) Resp:  [15-27] 18 (05/04 0800) BP: (101-123)/(62-82) 123/76 (05/04 0800) SpO2:  [93 %-100 %] 96 % (05/04 0800)  Physical Exam:  General: alert Lochia: appropriate Uterine Fundus: firm Incision: healing well, no significant drainage    Data Review Recent Labs    05/15/23 2144 05/18/23 0435  HGB 11.2* 7.3*  HCT 32.6* 21.4*    Assessment:  Principal Problem:   Cesarean delivery delivered Active Problems:   Gestational diabetes   Polyhydramnios affecting pregnancy     Overview: MVP 9.4, AFI 23   Single live birth   Arrest of dilation, delivered, current hospitalization   Non-reassuring fetal heart rate or rhythm affecting management of mother   Status post Cesarean section. Doing well postoperatively.     Plan:       Continue current care.  Discharge home tomorrow  Donato Fu, CNM 05/18/2023 9:03 AM

## 2023-05-18 NOTE — Lactation Note (Signed)
 This note was copied from a baby's chart. Lactation Consultation Note  Patient Name: Michele Bailey [id number]'W Date: 05/18/2023 Age:29 hours Reason for consult: Follow-up assessment;Term;Breastfeeding assistance;RN request   Maternal Data LC called to room by RN to help assist w/ a feeding. Infant just received a bath and was rooting, read to eat.  Patient verbalized that she pumped 10ml of colostrum while in labor and delivery on 5/2.  Unfortunately, that milk is expired now.  Feeding Mother's Current Feeding Choice: Breast Milk  Patient had infant on the left breast in cradle hold.  LC made minor adjustments to positioning and teaching patient how to hold infant at the breast.  Minor learning curve for mom but she got it and did an excellent job leading infant to the breast. Infant takes a while to latch.    LATCH Score Latch: Repeated attempts needed to sustain latch, nipple held in mouth throughout feeding, stimulation needed to elicit sucking reflex.  Audible Swallowing: A few with stimulation  Type of Nipple: Everted at rest and after stimulation  Comfort (Breast/Nipple): Soft / non-tender  Hold (Positioning): Assistance needed to correctly position infant at breast and maintain latch.  LATCH Score: 7  Interventions Interventions: Assisted with latch;Breast compression;Adjust position;Support pillows;Position options;Education;CDC milk storage guidelines  Consult Status Consult Status: Follow-up Follow-up type: In-patient    [name] S Garett Tetzloff 05/18/2023, 3:00 PM

## 2023-05-18 NOTE — Lactation Note (Signed)
 This note was copied from a baby's chart. Lactation Consultation Note  Patient Name: Michele Bailey [id number] Date: 05/18/2023 Age:28 hours Reason for consult: Follow-up assessment;Difficult latch;Breastfeeding assistance   Maternal Data Has patient been taught Hand Expression?: Yes Does the patient have breastfeeding experience prior to this delivery?: Yes Mom is comfortable positioning baby with multiple pillows in the bed, but often brings breast to baby and is slightly misaligned. Improved when she sat up and in football hold.   Feeding Mother's Current Feeding Choice: Breast Milk  LATCH Score Latch: Repeated attempts needed to sustain latch, nipple held in mouth throughout feeding, stimulation needed to elicit sucking reflex.  Audible Swallowing: Spontaneous and intermittent  Type of Nipple: Everted at rest and after stimulation  Comfort (Breast/Nipple): Filling, red/small blisters or bruises, mild/mod discomfort  Hold (Positioning): Assistance needed to correctly position infant at breast and maintain latch.  LATCH Score: 7   Lactation Tools Discussed/Used Tools: Nipple Shields Nipple shield size: 20 (used one with daughter and wanted to try it)  Interventions Interventions: Position options;Support pillows;Adjust position;Breast compression;Hand express;Assisted with latch;Breast feeding basics reviewed;Infant Driven Feeding Algorithm education Baby fussy and was on and off the nipple for around 10 min in various positions until latching to the right side with occasional swallows heard for approximately 7 minutes. Lips flanged and some stimulation on shoulders to maintain suck bursts.  Moved to left side football hold and discussed nipple shield. No drops when mom initially hand expressed either side. 20mm shield applied, latch achieved, and swallows heard.  Mom questioning milk supply and wanted to try again on right side. Blister on center of nipple raw but not actively  bleeding. Initially tried with the shield and then it was removed. RN evaluated the feeding and helped with hand expression. Latch achieved and swallows heard by all. Maintained regular pattern of suck bursts. Additional small clear blisters present on nipple when baby self detached and was calm and awake.   Discharge Discharge Education: Engorgement and breast care;Warning signs for feeding baby Pump: Personal (Lansinoh in room)  Consult Status Consult Status: Follow-up Follow-up type: In-patient    Seldon Dago 05/18/2023, 9:01 PM

## 2023-05-19 ENCOUNTER — Encounter: Payer: Self-pay | Admitting: Obstetrics

## 2023-05-19 DIAGNOSIS — D62 Acute posthemorrhagic anemia: Secondary | ICD-10-CM | POA: Insufficient documentation

## 2023-05-19 MED ORDER — OXYCODONE HCL 5 MG PO TABS: 5.0000 mg | ORAL_TABLET | ORAL | 0 refills | Status: AC | PRN

## 2023-05-19 MED ORDER — COCONUT OIL OIL: 1.0000 | TOPICAL_OIL | Status: AC | PRN

## 2023-05-19 MED ORDER — IBUPROFEN 600 MG PO TABS: 600.0000 mg | ORAL_TABLET | Freq: Four times a day (QID) | ORAL | 1 refills | Status: AC

## 2023-05-19 MED ORDER — DIBUCAINE (PERIANAL) 1 % EX OINT
1.0000 | TOPICAL_OINTMENT | CUTANEOUS | Status: AC | PRN
Start: 2023-05-19 — End: ?

## 2023-05-19 MED ORDER — SIMETHICONE 80 MG PO CHEW: 80.0000 mg | CHEWABLE_TABLET | ORAL | 0 refills | Status: AC | PRN

## 2023-05-19 MED ORDER — FERROUS SULFATE 325 (65 FE) MG PO TABS: 325.0000 mg | ORAL_TABLET | Freq: Every day | ORAL | 0 refills | Status: AC

## 2023-05-19 MED ORDER — NORETHINDRONE 0.35 MG PO TABS: 1.0000 | ORAL_TABLET | Freq: Every day | ORAL | 3 refills | Status: AC

## 2023-05-19 MED ORDER — WITCH HAZEL-GLYCERIN EX PADS
1.0000 | MEDICATED_PAD | CUTANEOUS | Status: AC | PRN
Start: 2023-05-19 — End: ?

## 2023-05-19 MED ORDER — DOCUSATE SODIUM 100 MG PO CAPS: 100.0000 mg | ORAL_CAPSULE | Freq: Two times a day (BID) | ORAL | 2 refills | Status: AC

## 2023-05-19 MED ORDER — ACETAMINOPHEN 500 MG PO TABS
1000.0000 mg | ORAL_TABLET | Freq: Four times a day (QID) | ORAL | 1 refills | Status: AC
Start: 2023-05-19 — End: ?

## 2023-05-19 MED ORDER — LIDOCAINE 5 % EX PTCH: 1.0000 | MEDICATED_PATCH | CUTANEOUS | 0 refills | Status: AC

## 2023-05-19 NOTE — Lactation Note (Signed)
 This note was copied from a baby's chart. Lactation Consultation Note  Patient Name: Michele Bailey Date: 05/19/2023 Age:29 hours Reason for consult: Follow-up assessment;Term;Other (Comment) (Discharge Education)   Maternal Data Discharge education/follow up assessment w/ 33hr old baby Michele and mom.  Patient stated that last night it got rough with feeding and she provided infant with a bottle of formula.  Patient stated she just doesn't feel like she is making enough.   Feeding Mother's Current Feeding Choice: Breast Milk and Formula  Interventions Interventions: Education;CDC milk storage guidelines  Discharge Discharge Education: Engorgement and breast care;Outpatient recommendation  Education on engorgement prevention/treatment was discussed as well as breastmilk storage guidelines.  LC provided patient with a handout on breastmilk storage guidelines from [facility]. [facility] outpatient lactation services phone number written on the white board in the room.  Patient verbalized understanding.  LC provided patient w/ a [facility] brochure as well, encouraged to call and set up an appointment.    Consult Status Consult Status: Complete Follow-up type: Call as needed    Michele Bailey 05/19/2023, 12:10 PM

## 2023-05-22 NOTE — Progress Notes (Deleted)
   Postoperative Cesarean Follow-up Visit AKEIA HAWKEY is a 28 y.o. 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  Subjective: {PAIN CONTROL:13522::"The patient is not having any pain."} She denies {Blank multiple:19196::"fever","chills","nausea","vomiting"}. Eating a regular diet {WITH-WITHOUT:5700} difficulty.  Is***not having regular bowel movements. Activity: {history; activity/diet:30389}. Bleeding is ***. She {Actions; denies/admits to:5300} issues with her incision.    Objective: LMP 08/17/2022 (Exact Date)  There is no height or weight on file to calculate BMI.  General:  alert and no distress  Abdomen: soft, bowel sounds active, non-tender  Incision:   {incision:13716::"no dehiscence","incision well approximated","healing well","no drainage","no erythema","no hernia","no seroma","no swelling"}    Assessment/Plan: EMMA-LOUISE KIHN is a 28 y.o. [phone] s/p ***LTCS (w/BTL) at ***w***d for ***, POD#***, here today for incision check, healing well. No concerns with incision today, remaining steri-strips removed and replaced.  -Discussed at-home care, healing expectations, si/sx of infection.  -Call clinic if developing redness, discharge, or increasing pain. -Avoid vigorous scrubbing/washing of incision site; hygiene reviewed. -May trim back steri-strips if they peel; should fully remove after 1 week from today. -May resume driving and light walking. Still no heavy lifting >10-12 lbs and pelvic rest advised until cleared at 6wk postpartum visit.  -If stooling regularly x 1-2 weeks, can take stool softener every other day x 1 week, taper as tolerated. -RTC 5wks for postpartum visit, sooner prn.   No follow-ups on file.   Sofia Dunn, DO [facility]

## 2023-05-23 ENCOUNTER — Telehealth: Payer: Self-pay | Admitting: Certified Nurse Midwife

## 2023-05-23 ENCOUNTER — Encounter: Admitting: Certified Nurse Midwife

## 2023-05-23 ENCOUNTER — Ambulatory Visit: Admitting: Obstetrics

## 2023-05-23 NOTE — Telephone Encounter (Signed)
 Reached out to pt to reschedule 1 week incision/mood check that was scheduled on 05/23/2023 at 3:35 with Dr. Dell Fennel.  Could not leave a message bc call could not be completed at this time.

## 2023-05-26 ENCOUNTER — Encounter: Payer: Self-pay | Admitting: Obstetrics

## 2023-05-26 NOTE — Telephone Encounter (Signed)
 Reached out to pt (2x) to reschedule 1 week incision/mood check that was scheduled on 5/9/025 at 3:35 with Dr. Dell Fennel.  Could not leave a message bc call could not be completed at this time.  Will send a MyChart letter to pt.

## 2023-06-13 ENCOUNTER — Other Ambulatory Visit: Payer: Self-pay

## 2023-06-13 DIAGNOSIS — Z131 Encounter for screening for diabetes mellitus: Secondary | ICD-10-CM

## 2023-06-24 ENCOUNTER — Ambulatory Visit: Admitting: Certified Nurse Midwife

## 2023-06-24 ENCOUNTER — Telehealth: Payer: Self-pay | Admitting: Certified Nurse Midwife

## 2023-06-24 ENCOUNTER — Other Ambulatory Visit

## 2023-06-24 NOTE — Telephone Encounter (Signed)
 Reached out to pt to reschedule appts that were scheduled on 06/24/2023-2 hr GTT was scheduled at 9:00 and 6 week PP visit was scheduled at 9:35 with Binnie Buffalo.  Left message for pt to call back to reschedule.

## 2023-06-25 ENCOUNTER — Encounter: Payer: Self-pay | Admitting: Certified Nurse Midwife

## 2023-06-25 NOTE — Telephone Encounter (Signed)
 Reached out to pt (2x) to reschedule appts that were scheduled on 6/0/2025-2 hr GTT was scheduled at 9:00 and 6 week PP visit was scheduled at 9:35 with Binnie Buffalo.  Left message for pt to call back to reschedule.  Will send a MyChart letter to pt.

## 2023-11-28 ENCOUNTER — Telehealth: Payer: Self-pay

## 2023-11-28 NOTE — Telephone Encounter (Signed)
 Called pt no answer, left detailed message regarding her question for ADHD screening and advise will need to schedule an appt.

## 2023-11-28 NOTE — Telephone Encounter (Signed)
 Copied from CRM #[id number]. Topic: Clinical - Medical Advice >> Nov 28, 2023 12:01 PM Gustabo D wrote: Pt wants to know if office does- ADHD screening or test for adults Give Pt a call at [phone]

## 2024-01-06 ENCOUNTER — Encounter: Payer: Self-pay | Admitting: *Deleted

## 2024-02-20 ENCOUNTER — Ambulatory Visit: Payer: Self-pay

## 2024-02-20 ENCOUNTER — Telehealth: Admitting: Family Medicine

## 2024-02-20 ENCOUNTER — Encounter: Payer: Self-pay | Admitting: Family Medicine

## 2024-02-20 DIAGNOSIS — R41840 Attention and concentration deficit: Secondary | ICD-10-CM

## 2024-02-20 DIAGNOSIS — F320 Major depressive disorder, single episode, mild: Secondary | ICD-10-CM

## 2024-02-20 DIAGNOSIS — F411 Generalized anxiety disorder: Secondary | ICD-10-CM | POA: Insufficient documentation

## 2024-02-20 MED ORDER — ESCITALOPRAM OXALATE 10 MG PO TABS: 10.0000 mg | ORAL_TABLET | Freq: Every day | ORAL | 1 refills | Status: AC

## 2024-02-20 NOTE — Progress Notes (Signed)
 "   MyChart Video Visit    Virtual Visit via Video Note   This format is felt to be most appropriate for this patient at this time. Physical exam was limited by quality of the video and audio technology used for the visit.  Audio would not work, so call had to be transitioned to telephone.  Patient location: Home Provider location: [facility]  I discussed the limitations of evaluation and management by telemedicine and the availability of in person appointments. The patient expressed understanding and agreed to proceed.  Patient: Michele Bailey   DOB: 12/06/1995   28 y.o. Female  MRN: [id number] Visit Date: 02/20/2024  Today's healthcare provider: LAURAINE LOISE BUOY, DO   No chief complaint on file.  Subjective    HPI  Michele Bailey is a 29 year old female with anxiety and depression who presents with increased symptoms over the past one to two weeks.  Over the past one to two weeks, she has experienced increased anxiety and depression. Her anxiety is characterized by a need for perfectionism, which has become more disruptive, leading to moodiness. She manages her anxiety with breathing exercises, walking, and occasional workouts, which she finds helpful. She has not previously engaged in counseling or therapy but is interested in exploring these options.  She has a history of brief Klonopin  use for anxiety and an unsuccessful trial of paroxetine  in 2021. She is curious about the possibility of having ADHD, especially after her son was diagnosed with it, and notes difficulties with focus and organization, describing episodes of feeling 'paralyzed' by messes.  Her anxiety and depression have made it somewhat difficult to manage tasks at work, home, and in social interactions. She experiences nervousness and anxiety throughout the day, especially when tasks or responsibilities arise, and reports being unable to control her worrying, which occurs all the time. She also notes  trouble relaxing and some restlessness every other day.   Medications: Show/hide medication list[1]       Objective    There were no vitals taken for this visit.     Physical Exam Constitutional:      General: She is not in acute distress.    Appearance: Normal appearance.  HENT:     Head: Normocephalic.  Pulmonary:     Effort: Pulmonary effort is normal. No respiratory distress.  Neurological:     Mental Status: She is alert and oriented to person, place, and time. Mental status is at baseline.       Assessment & Plan    Generalized anxiety disorder -     Ambulatory referral to Psychiatry -     Escitalopram  Oxalate; Take 1 tablet (10 mg total) by mouth daily.  Dispense: 30 tablet; Refill: 1  Depression, major, single episode, mild  Attention deficit      Generalized anxiety disorder Chronic anxiety exacerbated recently, affecting daily functioning. Escitalopram  recommended for chronic worrying and mood stabilization. Discussed side effects and GeneSight test limitations. - Prescribed escitalopram  for anxiety management. - Consider addition of Wellbutrin if sexual dysfunction occurs. - If escitalopram  is ineffective, consider transition to venlafaxine to also address possible attention deficit. - Provided information on GeneSight test. - Referred for counseling.  Depression, major, single episode, mild Symptoms include feeling down, poor appetite, and fatigue, impacting daily life. Escitalopram  chosen to address both anxiety and depressive symptoms. Discussed side effects and Wellbutrin addition if needed. - Prescribed escitalopram  for depressive symptoms. - Monitor for side effects and effectiveness. -  Consider Wellbutrin if sexual dysfunction occurs.  Attention deficit Concerns about adult ADHD due to focus and task initiation difficulties. Son recently diagnosed with ADHD. Referral planned for evaluation. - Referred for ADHD evaluation.    Return in  about 6 weeks (around 04/02/2024) for Anx/Dep w/PCP.     I discussed the assessment and treatment plan with the patient. The patient was provided an opportunity to ask questions and all were answered. The patient agreed with the plan and demonstrated an understanding of the instructions.   The patient was advised to call back or seek an in-person evaluation if the symptoms worsen or if the condition fails to improve as anticipated.  I provided 17 minutes of virtual-face-to-face and telephone non-face-to-face time during this encounter.   LAURAINE LOISE BUOY, DO [facility] [phone] (phone) [phone] (fax)  [facility]      [1]  Outpatient Medications Prior to Visit  Medication Sig   acetaminophen  (TYLENOL ) 500 MG tablet Take 2 tablets (1,000 mg total) by mouth every 6 (six) hours.   coconut oil OIL Apply 1 Application topically as needed.   dibucaine (NUPERCAINAL) 1 % OINT Place 1 Application rectally as needed for hemorrhoids.   docusate sodium  (COLACE) 100 MG capsule Take 1 capsule (100 mg total) by mouth 2 (two) times daily.   ferrous sulfate  325 (65 FE) MG tablet Take 1 tablet (325 mg total) by mouth daily with breakfast.   ibuprofen  (ADVIL ) 600 MG tablet Take 1 tablet (600 mg total) by mouth every 6 (six) hours.   norethindrone  (ORTHO MICRONOR ) 0.35 MG tablet Take 1 tablet (0.35 mg total) by mouth daily.   Prenatal Vit-Fe Fumarate-FA (PREPLUS) 27-1 MG TABS Take 1 tablet by mouth daily.   simethicone  (MYLICON) 80 MG chewable tablet Chew 1 tablet (80 mg total) by mouth as needed for flatulence.   witch hazel-glycerin  (TUCKS) pad Apply 1 Application topically as needed for hemorrhoids.   No facility-administered medications prior to visit.   "

## 2024-02-20 NOTE — Patient Instructions (Signed)
 GeneSight testing - Depending on insurance, it can have a copay of up to a maximum of $330 (even if insurance denies it).  If you want a more definite quote of the cost before committing to it, you can contact the company by email at [email or web address] [email or web address] or call [phone].

## 2024-02-20 NOTE — Telephone Encounter (Signed)
 FYI Only or Action Required?: FYI only for provider: appointment scheduled on 02/20/24.  Patient was last seen in primary care on 01/03/2023 by Ostwalt, Janna, PA-C.  Called Nurse Triage reporting Anxiety.  Symptoms began several weeks ago.  Interventions attempted: Rest, hydration, or home remedies.  Symptoms are: gradually worsening.  Triage Disposition: See Physician Within 24 Hours  Patient/caregiver understands and will follow disposition?: Yes Reason for Disposition  Patient sounds very upset or troubled to the triager  Answer Assessment - Initial Assessment Questions Pt reports one panic attack and anxiety that has been chronic, but worsening x 2 weeks ago. Had a baby 9 months ago. Would like to discuss with PCP and mentions buspar.   1. CONCERN: Did anything happen that prompted you to call today?      Worsening anxiety. 2. ANXIETY SYMPTOMS: Can you describe how you (your loved one; patient) have been feeling? (e.g., tense, restless, panicky, anxious, keyed up, overwhelmed, sense of impending doom).      Anxious 3. ONSET: How long have you been feeling this way? (e.g., hours, days, weeks)     Worsening x 2 weeks 4. SEVERITY: How would you rate the level of anxiety? (e.g., 0 - 10; or mild, moderate, severe).     Moderate 5. FUNCTIONAL IMPAIRMENT: How have these feelings affected your ability to do daily activities? Have you had more difficulty than usual doing your normal daily activities? (e.g., getting better, same, worse; self-care, school, work, interactions)     Effecting relationship with spouse  6. HISTORY: Have you felt this way before? Have you ever been diagnosed with an anxiety problem in the past? (e.g., generalized anxiety disorder, panic attacks, PTSD). If Yes, ask: How was this problem treated? (e.g., medicines, counseling, etc.)     Yes, but worsening 7. RISK OF HARM - SUICIDAL IDEATION: Do you ever have thoughts of hurting or killing  yourself? If Yes, ask:  Do you have these feelings now? Do you have a plan on how you would do this?     Denies 8. TREATMENT:  What has been done so far to treat this anxiety? (e.g., medicines, relaxation strategies). What has helped?     Denies 9. THERAPIST: Do you have a counselor or therapist? If Yes, ask: What is their name?     Denies 10. POTENTIAL TRIGGERS: Do you drink caffeinated beverages (e.g., coffee, colas, teas), and how much daily? Do you drink alcohol or use any drugs? Have you started any new medicines recently?       A lot of caffeine 11. PATIENT SUPPORT: Who is with you now? Who do you live with? Do you have family or friends who you can talk to?        Spouse but effecting relationship 12. OTHER SYMPTOMS: Do you have any other symptoms? (e.g., feeling depressed, trouble concentrating, trouble sleeping, trouble breathing, palpitations or fast heartbeat, chest pain, sweating, nausea, or diarrhea)       Chest tightness, sweating, head spinning 13. PREGNANCY: Is there any chance you are pregnant? When was your last menstrual period?       Denies, on [facility] and currently menstrual  Protocols used: Anxiety and Panic Attack-A-AH Copied from CRM #[id number]. Topic: Appointments - Appointment Scheduling >> Feb 20, 2024  9:52 AM Tonda B wrote: Patient/patient representative is calling to schedule an appointment. Refer to attachments for appointment information.  Patient is having panic attacks and is depressed

## 2024-03-10 ENCOUNTER — Ambulatory Visit: Admitting: Obstetrics
